# Patient Record
Sex: Female | Born: 1990 | Race: White | Hispanic: No | Smoking: Former smoker
Health system: Southern US, Community
[De-identification: ages and names within clinical notes are randomized; demographics above are authoritative.]

## PROBLEM LIST (undated history)

## (undated) ENCOUNTER — Inpatient Hospital Stay (HOSPITAL_COMMUNITY): Payer: Self-pay

## (undated) DIAGNOSIS — F32A Depression, unspecified: Secondary | ICD-10-CM

## (undated) DIAGNOSIS — D649 Anemia, unspecified: Secondary | ICD-10-CM

## (undated) DIAGNOSIS — K219 Gastro-esophageal reflux disease without esophagitis: Secondary | ICD-10-CM

## (undated) DIAGNOSIS — K279 Peptic ulcer, site unspecified, unspecified as acute or chronic, without hemorrhage or perforation: Secondary | ICD-10-CM

## (undated) DIAGNOSIS — IMO0002 Reserved for concepts with insufficient information to code with codable children: Secondary | ICD-10-CM

## (undated) DIAGNOSIS — J45909 Unspecified asthma, uncomplicated: Secondary | ICD-10-CM

## (undated) DIAGNOSIS — K3184 Gastroparesis: Secondary | ICD-10-CM

## (undated) DIAGNOSIS — M329 Systemic lupus erythematosus, unspecified: Secondary | ICD-10-CM

## (undated) DIAGNOSIS — F419 Anxiety disorder, unspecified: Secondary | ICD-10-CM

## (undated) DIAGNOSIS — K449 Diaphragmatic hernia without obstruction or gangrene: Secondary | ICD-10-CM

## (undated) DIAGNOSIS — O210 Mild hyperemesis gravidarum: Secondary | ICD-10-CM

## (undated) DIAGNOSIS — N200 Calculus of kidney: Secondary | ICD-10-CM

## (undated) HISTORY — DX: Diaphragmatic hernia without obstruction or gangrene: K44.9

## (undated) HISTORY — DX: Calculus of kidney: N20.0

## (undated) HISTORY — DX: Peptic ulcer, site unspecified, unspecified as acute or chronic, without hemorrhage or perforation: K27.9

## (undated) HISTORY — DX: Anemia, unspecified: D64.9

## (undated) HISTORY — DX: Anxiety disorder, unspecified: F41.9

## (undated) HISTORY — DX: Depression, unspecified: F32.A

## (undated) HISTORY — PX: CYST EXCISION: SHX5701

## (undated) HISTORY — PX: ESOPHAGOGASTRODUODENOSCOPY: SHX1529

---

## 2013-02-08 DIAGNOSIS — G5690 Unspecified mononeuropathy of unspecified upper limb: Secondary | ICD-10-CM | POA: Insufficient documentation

## 2013-02-08 HISTORY — DX: Unspecified mononeuropathy of unspecified upper limb: G56.90

## 2013-11-10 DIAGNOSIS — F32A Depression, unspecified: Secondary | ICD-10-CM | POA: Insufficient documentation

## 2013-12-28 DIAGNOSIS — K3184 Gastroparesis: Secondary | ICD-10-CM | POA: Insufficient documentation

## 2015-06-20 DIAGNOSIS — G894 Chronic pain syndrome: Secondary | ICD-10-CM | POA: Insufficient documentation

## 2015-06-20 DIAGNOSIS — F3162 Bipolar disorder, current episode mixed, moderate: Secondary | ICD-10-CM | POA: Insufficient documentation

## 2015-06-20 HISTORY — DX: Chronic pain syndrome: G89.4

## 2015-06-21 DIAGNOSIS — N94819 Vulvodynia, unspecified: Secondary | ICD-10-CM | POA: Insufficient documentation

## 2015-06-21 HISTORY — DX: Vulvodynia, unspecified: N94.819

## 2015-09-28 DIAGNOSIS — K589 Irritable bowel syndrome without diarrhea: Secondary | ICD-10-CM | POA: Insufficient documentation

## 2015-09-28 HISTORY — DX: Irritable bowel syndrome, unspecified: K58.9

## 2015-12-18 DIAGNOSIS — F319 Bipolar disorder, unspecified: Secondary | ICD-10-CM

## 2015-12-18 HISTORY — DX: Bipolar disorder, unspecified: F31.9

## 2016-02-01 DIAGNOSIS — F319 Bipolar disorder, unspecified: Secondary | ICD-10-CM | POA: Insufficient documentation

## 2016-02-01 HISTORY — DX: Bipolar disorder, unspecified: F31.9

## 2020-02-26 NOTE — L&D Delivery Note (Signed)
OB/GYN Faculty Practice Delivery Note  Meagan Hunter is a 30 y.o. E3O1224 s/p SVD at [redacted]w[redacted]d. She was admitted for eIOL after two weeks of prodromal labor, a history of a precipitous delivery and anxiety.   ROM: 2h 17m with clear fluid GBS Status: negative Maximum Maternal Temperature: 98.6  Labor Progress: Labor induced with pitocin titration and AROM. Rapidly progressed from 7cm to complete with strong urge to push.  Delivery Date/Time: 12/13/20 at 1458 Delivery: Called to room and patient was complete and pushing. Head delivered LOA. No nuchal cord present. Shoulder and body delivered in usual fashion. Infant with spontaneous cry, placed on mother's abdomen, dried and stimulated. Cord clamped x 2 after 3-minute delay, and cut by FOB. Cord blood drawn and cord sample collected. Placenta delivered spontaneously, intact, with 3-vessel marginally inserted cord. Fundus firm with massage and Pitocin. Labia, perineum, vagina, and cervix inspected, small superficial, hemostatic but not well-approximated perineal laceration found and repaired with 3.0 vicryl.   Placenta: intact, spontaneous, sent to L&D Complications: none Lacerations: superficial perineal lac EBL: 75 Analgesia: epidural  Postpartum Planning [x]  transfer orders to MB [x]  discharge summary started & shared [x]  message to sent to schedule follow-up  [x]  lists updated [x]  vaccines UTD  Infant: Boy(yes)  APGARs 9/9  3300g  , CNM, IBCLC Certified Nurse Midwife, [facility] for , [facility] 12/13/2020, 4:00 PM

## 2020-05-26 DIAGNOSIS — O0990 Supervision of high risk pregnancy, unspecified, unspecified trimester: Secondary | ICD-10-CM | POA: Insufficient documentation

## 2020-06-04 DIAGNOSIS — M329 Systemic lupus erythematosus, unspecified: Secondary | ICD-10-CM | POA: Insufficient documentation

## 2020-06-04 DIAGNOSIS — M351 Other overlap syndromes: Secondary | ICD-10-CM | POA: Insufficient documentation

## 2020-06-05 LAB — OB RESULTS CONSOLE HIV ANTIBODY (ROUTINE TESTING): HIV: NONREACTIVE

## 2020-06-05 LAB — HM PAP SMEAR: HM Pap smear: NORMAL

## 2020-06-05 LAB — OB RESULTS CONSOLE HEPATITIS B SURFACE ANTIGEN: Hepatitis B Surface Ag: NEGATIVE

## 2020-06-05 LAB — HEPATITIS C ANTIBODY: HCV Ab: NEGATIVE

## 2020-06-05 LAB — OB RESULTS CONSOLE RUBELLA ANTIBODY, IGM: Rubella: IMMUNE

## 2020-06-05 LAB — OB RESULTS CONSOLE RPR: RPR: NONREACTIVE

## 2020-07-19 DIAGNOSIS — O26899 Other specified pregnancy related conditions, unspecified trimester: Secondary | ICD-10-CM | POA: Insufficient documentation

## 2020-08-16 ENCOUNTER — Inpatient Hospital Stay (HOSPITAL_COMMUNITY)
Admission: AD | Admit: 2020-08-16 | Discharge: 2020-08-16 | Disposition: A | Discharge status: Home / Self Care | Payer: 59 | Attending: Obstetrics and Gynecology | Admitting: Obstetrics and Gynecology

## 2020-08-16 ENCOUNTER — Encounter (HOSPITAL_COMMUNITY): Payer: Self-pay | Admitting: Obstetrics and Gynecology

## 2020-08-16 DIAGNOSIS — O212 Late vomiting of pregnancy: Secondary | ICD-10-CM | POA: Insufficient documentation

## 2020-08-16 DIAGNOSIS — O210 Mild hyperemesis gravidarum: Secondary | ICD-10-CM

## 2020-08-16 DIAGNOSIS — Z87891 Personal history of nicotine dependence: Secondary | ICD-10-CM | POA: Diagnosis not present

## 2020-08-16 DIAGNOSIS — Z3A22 22 weeks gestation of pregnancy: Secondary | ICD-10-CM | POA: Diagnosis not present

## 2020-08-16 DIAGNOSIS — O219 Vomiting of pregnancy, unspecified: Secondary | ICD-10-CM | POA: Diagnosis not present

## 2020-08-16 HISTORY — DX: Gastro-esophageal reflux disease without esophagitis: K21.9

## 2020-08-16 HISTORY — DX: Gastroparesis: K31.84

## 2020-08-16 HISTORY — DX: Mild hyperemesis gravidarum: O21.0

## 2020-08-16 HISTORY — DX: Unspecified asthma, uncomplicated: J45.909

## 2020-08-16 HISTORY — DX: Reserved for concepts with insufficient information to code with codable children: IMO0002

## 2020-08-16 HISTORY — DX: Systemic lupus erythematosus, unspecified: M32.9

## 2020-08-16 LAB — CBC WITH DIFFERENTIAL/PLATELET
Abs Immature Granulocytes: 0.05 10*3/uL (ref 0.00–0.07)
Basophils Absolute: 0 10*3/uL (ref 0.0–0.1)
Basophils Relative: 0 %
Eosinophils Absolute: 0 10*3/uL (ref 0.0–0.5)
Eosinophils Relative: 0 %
HCT: 37.3 % (ref 36.0–46.0)
Hemoglobin: 12.7 g/dL (ref 12.0–15.0)
Immature Granulocytes: 0 %
Lymphocytes Relative: 17 %
Lymphs Abs: 2.3 10*3/uL (ref 0.7–4.0)
MCH: 32.1 pg (ref 26.0–34.0)
MCHC: 34 g/dL (ref 30.0–36.0)
MCV: 94.2 fL (ref 80.0–100.0)
Monocytes Absolute: 0.6 10*3/uL (ref 0.1–1.0)
Monocytes Relative: 4 %
Neutro Abs: 10.4 10*3/uL — ABNORMAL HIGH (ref 1.7–7.7)
Neutrophils Relative %: 79 %
Platelets: 190 10*3/uL (ref 150–400)
RBC: 3.96 MIL/uL (ref 3.87–5.11)
RDW: 12.8 % (ref 11.5–15.5)
WBC: 13.4 10*3/uL — ABNORMAL HIGH (ref 4.0–10.5)
nRBC: 0 % (ref 0.0–0.2)

## 2020-08-16 LAB — COMPREHENSIVE METABOLIC PANEL
ALT: 18 U/L (ref 0–44)
AST: 23 U/L (ref 15–41)
Albumin: 3.5 g/dL (ref 3.5–5.0)
Alkaline Phosphatase: 46 U/L (ref 38–126)
Anion gap: 13 (ref 5–15)
BUN: 7 mg/dL (ref 6–20)
CO2: 23 mmol/L (ref 22–32)
Calcium: 9.2 mg/dL (ref 8.9–10.3)
Chloride: 103 mmol/L (ref 98–111)
Creatinine, Ser: 0.8 mg/dL (ref 0.44–1.00)
GFR, Estimated: >60 mL/min (ref >60)
Glucose, Bld: 108 mg/dL — ABNORMAL HIGH (ref 70–99)
Potassium: 3.3 mmol/L — ABNORMAL LOW (ref 3.5–5.1)
Sodium: 139 mmol/L (ref 135–145)
Total Bilirubin: 0.9 mg/dL (ref 0.3–1.2)
Total Protein: 7 g/dL (ref 6.5–8.1)

## 2020-08-16 LAB — URINALYSIS, ROUTINE W REFLEX MICROSCOPIC
Bacteria, UA: NONE SEEN
Bilirubin Urine: NEGATIVE
Glucose, UA: NEGATIVE mg/dL
Hgb urine dipstick: NEGATIVE
Ketones, ur: 80 mg/dL — AB
Nitrite: NEGATIVE
Protein, ur: 30 mg/dL — AB
Specific Gravity, Urine: 1.023 (ref 1.005–1.030)
pH: 7 (ref 5.0–8.0)

## 2020-08-16 LAB — LIPASE, BLOOD: Lipase: 28 U/L (ref 11–51)

## 2020-08-16 MED ORDER — SCOPOLAMINE 1 MG/3DAYS TD PT72
1.0000 | MEDICATED_PATCH | TRANSDERMAL | Status: DC
  Administered 2020-08-16: 1.5 mg via TRANSDERMAL
  Filled 2020-08-16: qty 1

## 2020-08-16 MED ORDER — ONDANSETRON HCL 4 MG/2ML IJ SOLN
4.0000 mg | Freq: Once | INTRAMUSCULAR | Status: AC
  Administered 2020-08-16: 4 mg via INTRAVENOUS
  Filled 2020-08-16: qty 2

## 2020-08-16 MED ORDER — LACTATED RINGERS IV BOLUS
1000.0000 mL | Freq: Once | INTRAVENOUS | Status: AC
  Administered 2020-08-16: 1000 mL via INTRAVENOUS

## 2020-08-16 MED ORDER — SODIUM CHLORIDE 0.9 % IV SOLN
25.0000 mg | Freq: Four times a day (QID) | INTRAVENOUS | Status: DC | PRN
  Administered 2020-08-16: 25 mg via INTRAVENOUS
  Filled 2020-08-16 (×2): qty 1

## 2020-08-16 MED ORDER — ONDANSETRON 8 MG PO TBDP: 8.0000 mg | ORAL_TABLET | Freq: Three times a day (TID) | ORAL | 0 refills | Status: DC | PRN

## 2020-08-16 MED ORDER — POLYETHYLENE GLYCOL 3350 17 G PO PACK: 17.0000 g | PACK | Freq: Every day | ORAL | 0 refills | Status: DC

## 2020-08-16 MED ORDER — METOCLOPRAMIDE HCL 5 MG/ML IJ SOLN
10.0000 mg | Freq: Once | INTRAMUSCULAR | Status: AC
  Administered 2020-08-16: 10 mg via INTRAVENOUS
  Filled 2020-08-16: qty 2

## 2020-08-16 MED ORDER — SCOPOLAMINE 1 MG/3DAYS TD PT72: 1.0000 | MEDICATED_PATCH | TRANSDERMAL | 12 refills | Status: DC

## 2020-08-16 MED ORDER — FAMOTIDINE IN NACL 20-0.9 MG/50ML-% IV SOLN
20.0000 mg | Freq: Once | INTRAVENOUS | Status: AC
  Administered 2020-08-16: 20 mg via INTRAVENOUS
  Filled 2020-08-16: qty 50

## 2020-08-16 MED ORDER — PROMETHAZINE HCL 25 MG PO TABS: 25.0000 mg | ORAL_TABLET | Freq: Four times a day (QID) | ORAL | 0 refills | Status: DC | PRN

## 2020-08-16 MED ORDER — DOCUSATE SODIUM 100 MG PO CAPS: 100.0000 mg | ORAL_CAPSULE | Freq: Two times a day (BID) | ORAL | 0 refills | Status: DC

## 2020-08-16 NOTE — MAU Provider Note (Addendum)
History     CSN: [id number]  Arrival date and time: 08/16/20 0620   Event Date/Time   First Provider Initiated Contact with Patient 08/16/20 [phone]      Chief Complaint  Patient presents with   Emesis During Pregnancy   HPI Meagan Hunter is a 30 y.o. G3P2002 at [redacted]w[redacted]d who presents via EMS for nausea, vomiting, and abdominal pain. She gets her care in [facility] due to high risk r/t lupus. Has had hyperemesis gravidarum with all of her pregnancies. Symptoms have been well controlled with zofran, which she takes daily, but she ran out of zofran on Monday. Symptoms returned yesterday. Reports persistent nausea & vomiting. Has pain in the right side of her abdomen & flank. States pain feels sore and is worse with vomiting. Rates pain 8/10. Denies fever/chills, dysuria, hematuria, diarrhea, constipation, vaginal bleeding, or LOF. Hasn't felt the baby move as much since 11 pm last night.   OB History     Gravida  3   Para  2   Term  2   Preterm      AB      Living  2      SAB  0   IAB  0   Ectopic  0   Multiple      Live Births  2           Past Medical History:  Diagnosis Date   Asthma    Gastroparesis    GERD (gastroesophageal reflux disease)    Hyperemesis affecting pregnancy, antepartum    Lupus (HCC)     Past Surgical History:  Procedure Laterality Date   CYST EXCISION      History reviewed. No pertinent family history.  Social History   Tobacco Use   Smoking status: Former    Pack years: 0.00    Types: Cigarettes   Smokeless tobacco: Never  Substance Use Topics   Alcohol use: Not Currently   Drug use: Never    Allergies:  Allergies  Allergen Reactions   Penicillins Hives    Medications Prior to Admission  Medication Sig Dispense Refill Last Dose   ondansetron (ZOFRAN) 4 MG tablet Take 4 mg by mouth every 8 (eight) hours as needed for nausea or vomiting.       Review of Systems  Constitutional: Negative.   Gastrointestinal:   Positive for abdominal pain, nausea and vomiting. Negative for constipation and diarrhea.  Genitourinary: Negative.   Physical Exam   Blood pressure (!) 103/59, pulse 79, temperature 97.8 F (36.6 C), temperature source Oral, resp. rate 17, height 5\' 4"  (1.626 m), weight 61.2 kg, SpO2 100 %.  Physical Exam Constitutional:      General: She is not in acute distress.    Appearance: Normal appearance. She is normal weight.  HENT:     Head: Normocephalic and atraumatic.  Eyes:     General: No scleral icterus. Pulmonary:     Effort: Pulmonary effort is normal. No respiratory distress.  Abdominal:     Palpations: Abdomen is soft.     Tenderness: There is no abdominal tenderness. There is no right CVA tenderness, left CVA tenderness, guarding or rebound.  Skin:    General: Skin is warm and dry.  Neurological:     Mental Status: She is alert.  Psychiatric:        Mood and Affect: Mood normal.        Behavior: Behavior normal.    MAU Course  Procedures Results for  orders placed or performed during the hospital encounter of 08/16/20 (from the past 24 hour(s))  CBC with Differential/Platelet     Status: Abnormal   Collection Time: 08/16/20  6:55 AM  Result Value Ref Range   WBC 13.4 (H) 4.0 - 10.5 K/uL   RBC 3.96 3.87 - 5.11 MIL/uL   Hemoglobin 12.7 12.0 - 15.0 g/dL   HCT 31.5 40.0 - 86.7 %   MCV 94.2 80.0 - 100.0 fL   MCH 32.1 26.0 - 34.0 pg   MCHC 34.0 30.0 - 36.0 g/dL   RDW 61.9 50.9 - 32.6 %   Platelets 190 150 - 400 K/uL   nRBC 0.0 0.0 - 0.2 %   Neutrophils Relative % 79 %   Neutro Abs 10.4 (H) 1.7 - 7.7 K/uL   Lymphocytes Relative 17 %   Lymphs Abs 2.3 0.7 - 4.0 K/uL   Monocytes Relative 4 %   Monocytes Absolute 0.6 0.1 - 1.0 K/uL   Eosinophils Relative 0 %   Eosinophils Absolute 0.0 0.0 - 0.5 K/uL   Basophils Relative 0 %   Basophils Absolute 0.0 0.0 - 0.1 K/uL   Immature Granulocytes 0 %   Abs Immature Granulocytes 0.05 0.00 - 0.07 K/uL  Comprehensive metabolic  panel     Status: Abnormal   Collection Time: 08/16/20  6:55 AM  Result Value Ref Range   Sodium 139 135 - 145 mmol/L   Potassium 3.3 (L) 3.5 - 5.1 mmol/L   Chloride 103 98 - 111 mmol/L   CO2 23 22 - 32 mmol/L   Glucose, Bld 108 (H) 70 - 99 mg/dL   BUN 7 6 - 20 mg/dL   Creatinine, Ser 7.12 0.44 - 1.00 mg/dL   Calcium 9.2 8.9 - 45.8 mg/dL   Total Protein 7.0 6.5 - 8.1 g/dL   Albumin 3.5 3.5 - 5.0 g/dL   AST 23 15 - 41 U/L   ALT 18 0 - 44 U/L   Alkaline Phosphatase 46 38 - 126 U/L   Total Bilirubin 0.9 0.3 - 1.2 mg/dL   GFR, Estimated >09 >98 mL/min   Anion gap 13 5 - 15  Lipase, blood     Status: None   Collection Time: 08/16/20  6:55 AM  Result Value Ref Range   Lipase 28 11 - 51 U/L   No results found.  MDM FHT present via doppler  Patient vomiting on arrival. IV fluids, zofran, & pepcid ordered. Patient states symptoms are consistent with her HEG but worse than normal. Reports pain throughout her right abdomen & flank. Benign abdominal exam. CBC, CMP, lipase, and u/a pending.    Care turned over to [facility] CNM Judeth Horn, NP 08/16/2020 8:18 AM  Assessment and Plan   1. Morning sickness     0900: patient feeling nauseated and says that sips of ginger ale made it worse, will give more fluids and phenergan, scop patch 1241: patient feeling better; still has some burning in her chest but it is manageable and she does not want GI Cocktail.  -patient to try sips of water through straw Patient tolerated PO, reports that her nausea is completely gone.  -will discharge home with new RX for zofran, phenergan, scop patch -all questions answered, patient agrees with plan of care.  -patient to follow up in [facility] her ob provider   Luna Kitchens

## 2020-08-16 NOTE — MAU Note (Signed)
PT SAYS SHE STARTED VOMITING Tuesday AM- SAW BROWN AND RED IN VOMIT. Lower right sharp abd pain  and upper is cramping  PNV is [facility]  bc pt has Lupus

## 2020-09-18 ENCOUNTER — Telehealth: Payer: Self-pay | Admitting: *Deleted

## 2020-09-18 MED ORDER — ONDANSETRON 8 MG PO TBDP: 8.0000 mg | ORAL_TABLET | Freq: Three times a day (TID) | ORAL | 0 refills | Status: DC | PRN

## 2020-09-18 NOTE — Telephone Encounter (Signed)
Pt called stating that she has transferred her OB care from [facility] to [place].  She is requesting a RF on Zofran but since she is no longer [facility] pt they are denying the RX.  I have spoken with Dr Macon Large who gave a verbal order to RF the Zofran.

## 2020-09-28 ENCOUNTER — Other Ambulatory Visit: Payer: Self-pay

## 2020-09-28 ENCOUNTER — Encounter: Payer: Self-pay | Admitting: Obstetrics and Gynecology

## 2020-09-28 ENCOUNTER — Ambulatory Visit (INDEPENDENT_AMBULATORY_CARE_PROVIDER_SITE_OTHER): Payer: Medicaid Other | Admitting: Obstetrics and Gynecology

## 2020-09-28 VITALS — BP 108/71 | HR 110 | Wt 144.0 lb

## 2020-09-28 DIAGNOSIS — Z3490 Encounter for supervision of normal pregnancy, unspecified, unspecified trimester: Secondary | ICD-10-CM

## 2020-09-28 DIAGNOSIS — M329 Systemic lupus erythematosus, unspecified: Secondary | ICD-10-CM

## 2020-09-28 DIAGNOSIS — Z3A28 28 weeks gestation of pregnancy: Secondary | ICD-10-CM

## 2020-09-28 DIAGNOSIS — O99113 Other diseases of the blood and blood-forming organs and certain disorders involving the immune mechanism complicating pregnancy, third trimester: Secondary | ICD-10-CM

## 2020-09-28 DIAGNOSIS — R12 Heartburn: Secondary | ICD-10-CM

## 2020-09-28 DIAGNOSIS — O99343 Other mental disorders complicating pregnancy, third trimester: Secondary | ICD-10-CM

## 2020-09-28 DIAGNOSIS — O26893 Other specified pregnancy related conditions, third trimester: Secondary | ICD-10-CM

## 2020-09-28 NOTE — Patient Instructions (Signed)
[email or web address].com/.aspx">  Third Trimester of Pregnancy  The third trimester of pregnancy is from week 28 through week 40. This is months 7 through 9. The third trimester is a time when the unborn baby (fetus) is growing rapidly. At the end of the ninth month, the fetus is about 20inches long and weighs 6-10 pounds. Body changes during your third trimester During the third trimester, your body will continue to go through many changes.The changes vary and generally return to normal after your baby is born. Physical changes Your weight will continue to increase. You can expect to gain 25-35 pounds (11-16 kg) by the end of the pregnancy if you begin pregnancy at a normal weight. If you are underweight, you can expect to gain 28-40 lb (about 13-18 kg), and if you are overweight, you can expect to gain 15-25 lb (about 7-11 kg). You may begin to get stretch marks on your hips, abdomen, and breasts. Your breasts will continue to grow and may hurt. A yellow fluid (colostrum) may leak from your breasts. This is the first milk you are producing for your baby. You may have changes in your hair. These can include thickening of your hair, rapid growth, and changes in texture. Some people also have hair loss during or after pregnancy, or hair that feels dry or thin. Your belly button may stick out. You may notice more swelling in your hands, face, or ankles. Health changes You may have heartburn. You may have constipation. You may develop hemorrhoids. You may develop swollen, bulging veins (varicose veins) in your legs. You may have increased body aches in the pelvis, back, or thighs. This is due to weight gain and increased hormones that are relaxing your joints. You may have increased tingling or numbness in your hands, arms, and legs. The skin on your abdomen may also feel numb. You may feel short of breath because of your expanding uterus. Other  changes You may urinate more often because the fetus is moving lower into your pelvis and pressing on your bladder. You may have more problems sleeping. This may be caused by the size of your abdomen, an increased need to urinate, and an increase in your body's metabolism. You may notice the fetus "dropping," or moving lower in your abdomen (lightening). You may have increased vaginal discharge. You may notice that you have pain around your pelvic bone as your uterus distends. Follow these instructions at home: Medicines Follow your health care provider's instructions regarding medicine use. Specific medicines may be either safe or unsafe to take during pregnancy. Do not take any medicines unless approved by your health care provider. Take a prenatal vitamin that contains at least 600 micrograms (mcg) of folic acid. Eating and drinking Eat a healthy diet that includes fresh fruits and vegetables, whole grains, good sources of protein such as meat, eggs, or tofu, and low-fat dairy products. Avoid raw meat and unpasteurized juice, milk, and cheese. These carry germs that can harm you and your baby. Eat 4 or 5 small meals rather than 3 large meals a day. You may need to take these actions to prevent or treat constipation: Drink enough fluid to keep your urine pale yellow. Eat foods that are high in fiber, such as beans, whole grains, and fresh fruits and vegetables. Limit foods that are high in fat and processed sugars, such as fried or sweet foods. Activity Exercise only as directed by your health care provider. Most people can continue their usual exercise routine during pregnancy. Try  to exercise for 30 minutes at least 5 days a week. Stop exercising if you experience contractions in the uterus. Stop exercising if you develop pain or cramping in the lower abdomen or lower back. Avoid heavy lifting. Do not exercise if it is very hot or humid or if you are at a high altitude. If you choose to,  you may continue to have sex unless your health care provider tells you not to. Relieving pain and discomfort Take frequent breaks and rest with your legs raised (elevated) if you have leg cramps or low back pain. Take warm sitz baths to soothe any pain or discomfort caused by hemorrhoids. Use hemorrhoid cream if your health care provider approves. Wear a supportive bra to prevent discomfort from breast tenderness. If you develop varicose veins: Wear support hose as told by your health care provider. Elevate your feet for 15 minutes, 3-4 times a day. Limit salt in your diet. Safety Talk to your health care provider before traveling far distances. Do not use hot tubs, steam rooms, or saunas. Wear your seat belt at all times when driving or riding in a car. Talk with your health care provider if someone is verbally or physically abusive to you. Preparing for birth To prepare for the arrival of your baby: Take prenatal classes to understand, practice, and ask questions about labor and delivery. Visit the hospital and tour the maternity area. Purchase a rear-facing car seat and make sure you know how to install it in your car. Prepare the baby's room or sleeping area. Make sure to remove all pillows and stuffed animals from the baby's crib to prevent suffocation. General instructions Avoid cat litter boxes and soil used by cats. These carry germs that can cause birth defects in the baby. If you have a cat, ask someone to clean the litter box for you. Do not douche or use tampons. Do not use scented sanitary pads. Do not use any products that contain nicotine or tobacco, such as cigarettes, e-cigarettes, and chewing tobacco. If you need help quitting, ask your health care provider. Do not use any herbal remedies, illegal drugs, or medicines that were not prescribed to you. Chemicals in these products can harm your baby. Do not drink alcohol. You will have more frequent prenatal exams during the  third trimester. During a routine prenatal visit, your health care provider will do a physical exam, perform tests, and discuss your overall health. Keep all follow-up visits. This is important. Where to find more information American Pregnancy Association: americanpregnancy.[place] and Gynecologists: PoolDevices.com.pt Office on [employer]: KeywordPortfolios.com.br Contact a health care provider if you have: A fever. Mild pelvic cramps, pelvic pressure, or nagging pain in your abdominal area or lower back. Vomiting or diarrhea. Bad-smelling vaginal discharge or foul-smelling urine. Pain when you urinate. A headache that does not go away when you take medicine. Visual changes or see spots in front of your eyes. Get help right away if: Your water breaks. You have regular contractions less than 5 minutes apart. You have spotting or bleeding from your vagina. You have severe abdominal pain. You have difficulty breathing. You have chest pain. You have fainting spells. You have not felt your baby move for the time period told by your health care provider. You have new or increased pain, swelling, or redness in an arm or leg. Summary The third trimester of pregnancy is from week 28 through week 40 (months 7 through 9). You may have more problems  sleeping. This can be caused by the size of your abdomen, an increased need to urinate, and an increase in your body's metabolism. You will have more frequent prenatal exams during the third trimester. Keep all follow-up visits. This is important. This information is not intended to replace advice given to you by your health care provider. Make sure you discuss any questions you have with your healthcare provider. Document Revised: 07/21/2019 Document Reviewed: 05/27/2019 Elsevier Patient Education  2022 Elsevier Inc.  Contraception Choices Contraception, also called birth control, refers to  methods or devices thatprevent pregnancy. Hormonal methods  Contraceptive implant A contraceptive implant is a thin, plastic tube that contains a hormone that prevents pregnancy. It is different from an intrauterine device (IUD). It is inserted into the upper part of the arm by a health care provider. Implants canbe effective for up to 3 years. Progestin-only injections Progestin-only injections are injections of progestin, a synthetic form of thehormone progesterone. They are given every 3 months by a health care provider. Birth control pills Birth control pills are pills that contain hormones that prevent pregnancy. They must be taken once a day, preferably at the same time each day. Aprescription is needed to use this method of contraception. Birth control patch The birth control patch contains hormones that prevent pregnancy. It is placed on the skin and must be changed once a week for three weeks and removed on thefourth week. A prescription is needed to use this method of contraception. Vaginal ring A vaginal ring contains hormones that prevent pregnancy. It is placed in the vagina for three weeks and removed on the fourth week. After that, the process is repeated with a new ring. A prescription is needed to use this method ofcontraception. Emergency contraceptive Emergency contraceptives prevent pregnancy after unprotected sex. They come in pill form and can be taken up to 5 days after sex. They work best the sooner they are taken after having sex. Most emergency contraceptives are available without a prescription. This method should not be used as your only form ofbirth control. Barrier methods  Female condom A female condom is a thin sheath that is worn over the penis during sex. Condoms keep sperm from going inside a woman's body. They can be used with a sperm-killing substance (spermicide) to increase their effectiveness. They should be thrown away after one use. Female condom A female  condom is a soft, loose-fitting sheath that is put into the vagina before sex. The condom keeps sperm from going inside a woman's body. Theyshould be thrown away after one use. Diaphragm A diaphragm is a soft, dome-shaped barrier. It is inserted into the vagina before sex, along with a spermicide. The diaphragm blocks sperm from entering the uterus, and the spermicide kills sperm. A diaphragm should be left in thevagina for 6-8 hours after sex and removed within 24 hours. A diaphragm is prescribed and fitted by a health care provider. A diaphragm should be replaced every 1-2 years, after giving birth, after gaining more than15 lb (6.8 kg), and after pelvic surgery. Cervical cap A cervical cap is a round, soft latex or plastic cup that fits over the cervix. It is inserted into the vagina before sex, along with spermicide. It blocks sperm from entering the uterus. The cap should be left in place for 6-8 hours after sex and removed within 48 hours. A cervical cap must be prescribed andfitted by a health care provider. It should be replaced every 2 years. Sponge A sponge is a soft,   circular piece of polyurethane foam with spermicide in it. The sponge helps block sperm from entering the uterus, and the spermicide kills sperm. To use it, you make it wet and then insert it into the vagina. It should be inserted before sex, left in for at least 6 hours after sex, and removed andthrown away within 30 hours. Spermicides Spermicides are chemicals that kill or block sperm from entering the cervix and uterus. They can come as a cream, jelly, suppository, foam, or tablet. A spermicide should be inserted into the vagina with an applicator at least 10-15 minutes before sex to allow time for it to work. The process must be repeatedevery time you have sex. Spermicides do not require a prescription. Intrauterine contraception Intrauterine device (IUD) An IUD is a T-shaped device that is put in a woman's uterus. There are  two types: Hormone IUD.This type contains progestin, a synthetic form of the hormone progesterone. This type can stay in place for 3-5 years. Copper IUD.This type is wrapped in copper wire. It can stay in place for 10 years. Permanent methods of contraception Female tubal ligation In this method, a woman's fallopian tubes are sealed, tied, or blocked duringsurgery to prevent eggs from traveling to the uterus. Hysteroscopic sterilization In this method, a small, flexible insert is placed into each fallopian tube. The inserts cause scar tissue to form in the fallopian tubes and block them, so sperm cannot reach an egg. The procedure takes about 3 months to be effective.Another form of birth control must be used during those 3 months. Female sterilization This is a procedure to tie off the tubes that carry sperm (vasectomy). After the procedure, the man can still ejaculate fluid (semen). Another form of birth control must be used for 3 months after the procedure. Natural planning methods Natural family planning In this method, a couple does not have sex on days when the woman could become pregnant. Calendar method In this method, the woman keeps track of the length of each menstrual cycle, identifies the days when pregnancy can happen, and does not have sex on those days. Ovulation method In this method, a couple avoids sex during ovulation. Symptothermal method This method involves not having sex during ovulation. The woman typically checks for ovulation bywatching changes in her temperature and in the consistency of cervical mucus. Post-ovulation method In this method, a couple waits to have sex until after ovulation. Where to find more information Centers for Disease Control and Prevention: FootballExhibition.com.br Summary Contraception, also called birth control, refers to methods or devices that prevent pregnancy. Hormonal methods of contraception include implants, injections, pills, patches, vaginal  rings, and emergency contraceptives. Barrier methods of contraception can include female condoms, female condoms, diaphragms, cervical caps, sponges, and spermicides. There are two types of IUDs (intrauterine devices). An IUD can be put in a woman's uterus to prevent pregnancy for 3-5 years. Permanent sterilization can be done through a procedure for males and females. Natural family planning methods involve nothaving sex on days when the woman could become pregnant. This information is not intended to replace advice given to you by your health care provider. Make sure you discuss any questions you have with your healthcare provider. Document Revised: 07/19/2019 Document Reviewed: 07/19/2019 Elsevier Patient Education  2022 [employer].

## 2020-09-28 NOTE — Progress Notes (Signed)
  Subjective:    Meagan Hunter is a [id number] [redacted]w[redacted]d being seen today for her first obstetrical visit.  Patient is transferring care from [facility] with records. Her obstetrical history is significant for SLE. Patient was seen by MFM on 7/1 with plans for fetal echo and serial growth ultrasound. Patient also reports a history of seizure while on Sertraline. Patient states these were medication induced seizures and she has never had seizures before or after sertraline intake. Patient does intend to breast feed. Pregnancy history fully reviewed.  Patient reports persistent nausea despite the use of zofran and phenergan. She has tried diclegis and scopolamine patch without improvement.  Vitals:   09/28/20 0857  BP: 108/71  Pulse: (!) 110  Weight: 144 lb (65.3 kg)    HISTORY: OB History  Gravida Para Term Preterm AB Living  3 2 2     2   SAB IAB Ectopic Multiple Live Births  0 0 0   2    # Outcome Date GA Lbr Len/2nd Weight Sex Delivery Anes PTL Lv  3 Current           2 Term      Vag-Spont     1 Term      Vag-Spont      Past Medical History:  Diagnosis Date   Asthma    Gastroparesis    GERD (gastroesophageal reflux disease)    Hyperemesis affecting pregnancy, antepartum    Lupus (HCC)    Past Surgical History:  Procedure Laterality Date   CYST EXCISION     Family History  Problem Relation Age of Onset   Hypertension Mother    Cancer Mother    Thyroid cancer Mother    Hypertension Father      Exam    Uterus:  Fundal Height: 28 cm      Assessment:    Pregnancy: Patient Active Problem List   Diagnosis Date Noted   Heartburn in pregnancy 07/19/2020   Lupus (systemic lupus erythematosus) (HCC) 06/04/2020   Supervision of high risk pregnancy, antepartum 05/26/2020   Bipolar 1 disorder, depressed (HCC) 02/01/2016   Bipolar disorder, rapid cycling (HCC) 12/18/2015   Vulvodynia, 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 Problem list reviewed and updated.  Ultrasound discussed; fetal survey: results reviewed. Follow up growth ultrasound scheduled along with fetal echo Patient unable to stay for 2 hour glucola due to work schedule. Patient plans to return prior to her next appointment for glucola Patient is not taking previously prescribed ASA as she feels she is taking a lot of medication for the management of her hyperemesis Patient is in search of a new pediatrician and is undecided on contraception  Follow up in 2 weeks. 50% of 30 min visit spent on counseling and coordination of care.     Shaida Route 09/28/2020

## 2020-10-02 ENCOUNTER — Other Ambulatory Visit: Payer: Medicaid Other

## 2020-10-04 ENCOUNTER — Telehealth: Payer: Self-pay | Admitting: *Deleted

## 2020-10-04 NOTE — Telephone Encounter (Signed)
Left patient an urgent message to call the office to reschedule missed 2 hour GTT on 10/02/2020 at 8:30 AM. Patient has not read MyChart message sent on 10/02/2020.

## 2020-10-05 ENCOUNTER — Other Ambulatory Visit: Payer: Self-pay | Admitting: *Deleted

## 2020-10-05 MED ORDER — ONDANSETRON 8 MG PO TBDP
8.0000 mg | ORAL_TABLET | Freq: Three times a day (TID) | ORAL | 0 refills | Status: DC | PRN
Start: 2020-10-05 — End: 2020-11-07

## 2020-10-05 NOTE — Telephone Encounter (Signed)
Pt called requesting a RF on her Zofran.  RF sent.

## 2020-10-13 ENCOUNTER — Other Ambulatory Visit: Payer: Self-pay

## 2020-10-13 ENCOUNTER — Ambulatory Visit (INDEPENDENT_AMBULATORY_CARE_PROVIDER_SITE_OTHER): Payer: Medicaid Other | Admitting: Obstetrics and Gynecology

## 2020-10-13 VITALS — BP 114/76 | HR 106 | Wt 144.0 lb

## 2020-10-13 DIAGNOSIS — M329 Systemic lupus erythematosus, unspecified: Secondary | ICD-10-CM

## 2020-10-13 DIAGNOSIS — Z23 Encounter for immunization: Secondary | ICD-10-CM | POA: Diagnosis not present

## 2020-10-13 DIAGNOSIS — Z3A30 30 weeks gestation of pregnancy: Secondary | ICD-10-CM

## 2020-10-13 DIAGNOSIS — O0990 Supervision of high risk pregnancy, unspecified, unspecified trimester: Secondary | ICD-10-CM

## 2020-10-13 MED ORDER — HYDROCORTISONE ACETATE 25 MG RE SUPP: 25.0000 mg | Freq: Two times a day (BID) | RECTAL | 0 refills | Status: DC

## 2020-10-13 MED ORDER — BLOOD GLUCOSE MONITOR KIT: PACK | 0 refills | Status: DC

## 2020-10-13 MED ORDER — PROMETHAZINE HCL 25 MG PO TABS: 25.0000 mg | ORAL_TABLET | Freq: Four times a day (QID) | ORAL | 1 refills | Status: DC | PRN

## 2020-10-13 NOTE — Progress Notes (Signed)
   PRENATAL VISIT NOTE  Subjective:  Meagan Hunter is a 30 y.o. G3P2002 at [redacted]w[redacted]d being seen today for ongoing prenatal care.  She is currently monitored for the following issues for this high-risk pregnancy and has Supervision of high risk pregnancy, antepartum; Bipolar 1 disorder, depressed ([place]); Lupus (systemic lupus erythematosus) ([place]); Vulvodynia, unspecified; Heartburn in pregnancy; Depression; and Bipolar disorder, rapid cycling ([place]) on their problem list.  Patient reports no complaints.  Contractions: Not present. Vag. Bleeding: None.  Movement: Present. Denies leaking of fluid.   The following portions of the patient's history were reviewed and updated as appropriate: allergies, current medications, past family history, past medical history, past social history, past surgical history and problem list.   Objective:   Vitals:   10/13/20 1113  BP: 114/76  Pulse: (!) 106  Weight: 144 lb (65.3 kg)    Fetal Status: Fetal Heart Rate (bpm): 135 Fundal Height: 31 cm Movement: Present     General:  Alert, oriented and cooperative. Patient is in no acute distress.  Skin: Skin is warm and dry. No rash noted.   Cardiovascular: Normal heart rate noted  Respiratory: Normal respiratory effort, no problems with respiration noted  Abdomen: Soft, gravid, appropriate for gestational age.  Pain/Pressure: Present     Pelvic: Cervical exam deferred        Extremities: Normal range of motion.  Edema: None  Mental Status: Normal mood and affect. Normal behavior. Normal judgment and thought content.   Assessment and Plan:  Pregnancy: G3P2002 at [redacted]w[redacted]d  1. Supervision of high risk pregnancy, antepartum  - She is not able to tolerate 2 hour GTT, reports taking Zofran in the AM and still vomiting. She is agreeable to check her blood sugars 4 x per day and bring her log in 2 weeks.  - CBC w/Diff - HgB A1c - blood glucose meter kit and supplies KIT; Dispense based on patient and insurance  preference. Use up to four times daily as directed.  Dispense: 1 each; Refill: 0  2. Systemic lupus erythematosus, unspecified SLE type, unspecified organ involvement status ([place])  Should see MD periodically, ok to go back to App schedule if [facility] with MD MFM [place] scheduled Will start antenatal testing @ 32 weeks.  Not currently taking medication for Lupus (prefers natural treatment)   Preterm labor symptoms and general obstetric precautions including but not limited to vaginal bleeding, contractions, leaking of fluid and fetal movement were reviewed in detail with the patient. Please refer to After Visit Summary for other counseling recommendations.   Return in about 2 weeks (around 10/27/2020).  Future Appointments  Date Time Provider [place]  10/23/2020 10:50 AM Guss Bunde, MD [facility]  10/23/2020  1:30 PM [facility] NURSE [facility]  10/23/2020  1:45 PM [facility] US6 [facility] [place], NP

## 2020-10-13 NOTE — Progress Notes (Signed)
Pt unable to do 28 week blood work due to not feeling well- will reschedule

## 2020-10-14 LAB — CBC WITH DIFFERENTIAL/PLATELET
Absolute Monocytes: 694 cells/uL (ref 200–950)
Basophils Absolute: 41 cells/uL (ref 0–200)
Basophils Relative: 0.3 %
Eosinophils Absolute: 136 cells/uL (ref 15–500)
Eosinophils Relative: 1 %
HCT: 36.4 % (ref 35.0–45.0)
Hemoglobin: 12.3 g/dL (ref 11.7–15.5)
Lymphs Abs: 3155 cells/uL (ref 850–3900)
MCH: 32.5 pg (ref 27.0–33.0)
MCHC: 33.8 g/dL (ref 32.0–36.0)
MCV: 96.3 fL (ref 80.0–100.0)
MPV: 10.9 fL (ref 7.5–12.5)
Monocytes Relative: 5.1 %
Neutro Abs: 9574 cells/uL — ABNORMAL HIGH (ref 1500–7800)
Neutrophils Relative %: 70.4 %
Platelets: 170 10*3/uL (ref 140–400)
RBC: 3.78 10*6/uL — ABNORMAL LOW (ref 3.80–5.10)
RDW: 12.5 % (ref 11.0–15.0)
Total Lymphocyte: 23.2 %
WBC: 13.6 10*3/uL — ABNORMAL HIGH (ref 3.8–10.8)

## 2020-10-14 LAB — HEMOGLOBIN A1C
Hgb A1c MFr Bld: 4.6 % of total Hgb (ref <5.7)
Mean Plasma Glucose: 85 mg/dL
eAG (mmol/L): 4.7 mmol/L

## 2020-10-17 ENCOUNTER — Other Ambulatory Visit: Payer: Self-pay | Admitting: *Deleted

## 2020-10-17 MED ORDER — PANTOPRAZOLE SODIUM 20 MG PO TBEC: 20.0000 mg | DELAYED_RELEASE_TABLET | Freq: Every day | ORAL | 6 refills | Status: DC

## 2020-10-23 ENCOUNTER — Encounter: Payer: Self-pay | Admitting: *Deleted

## 2020-10-23 ENCOUNTER — Other Ambulatory Visit: Payer: Self-pay

## 2020-10-23 ENCOUNTER — Other Ambulatory Visit: Payer: Self-pay | Admitting: Obstetrics and Gynecology

## 2020-10-23 ENCOUNTER — Ambulatory Visit (INDEPENDENT_AMBULATORY_CARE_PROVIDER_SITE_OTHER): Payer: Medicaid Other | Admitting: Obstetrics & Gynecology

## 2020-10-23 ENCOUNTER — Ambulatory Visit (HOSPITAL_BASED_OUTPATIENT_CLINIC_OR_DEPARTMENT_OTHER): Payer: Medicaid Other

## 2020-10-23 ENCOUNTER — Ambulatory Visit: Payer: Medicaid Other | Attending: Obstetrics and Gynecology | Admitting: *Deleted

## 2020-10-23 ENCOUNTER — Other Ambulatory Visit: Payer: Self-pay | Admitting: *Deleted

## 2020-10-23 VITALS — BP 111/67 | HR 104 | Wt 147.0 lb

## 2020-10-23 VITALS — BP 118/66 | HR 99

## 2020-10-23 DIAGNOSIS — O99891 Other specified diseases and conditions complicating pregnancy: Secondary | ICD-10-CM | POA: Diagnosis not present

## 2020-10-23 DIAGNOSIS — Z3490 Encounter for supervision of normal pregnancy, unspecified, unspecified trimester: Secondary | ICD-10-CM

## 2020-10-23 DIAGNOSIS — M329 Systemic lupus erythematosus, unspecified: Secondary | ICD-10-CM

## 2020-10-23 DIAGNOSIS — Z363 Encounter for antenatal screening for malformations: Secondary | ICD-10-CM | POA: Diagnosis not present

## 2020-10-23 DIAGNOSIS — Z3A32 32 weeks gestation of pregnancy: Secondary | ICD-10-CM | POA: Insufficient documentation

## 2020-10-23 DIAGNOSIS — O0990 Supervision of high risk pregnancy, unspecified, unspecified trimester: Secondary | ICD-10-CM

## 2020-10-23 DIAGNOSIS — F319 Bipolar disorder, unspecified: Secondary | ICD-10-CM

## 2020-10-23 DIAGNOSIS — N94819 Vulvodynia, unspecified: Secondary | ICD-10-CM

## 2020-10-23 NOTE — Progress Notes (Signed)
   PRENATAL VISIT NOTE  Subjective:  Meagan Hunter is a 30 y.o. G3P2002 at [redacted]w[redacted]d being seen today for ongoing prenatal care.  She is currently monitored for the following issues for this high-risk pregnancy and has Supervision of high risk pregnancy, antepartum; Bipolar 1 disorder, depressed (HCC); Lupus (systemic lupus erythematosus) (HCC); Vulvodynia, unspecified; Heartburn in pregnancy; Depression; and Bipolar disorder, rapid cycling (HCC) on their problem list.  Patient reports nausea and vomiting.  Contractions: Not present. Vag. Bleeding: None.  Movement: Present. Denies leaking of fluid.   The following portions of the patient's history were reviewed and updated as appropriate: allergies, current medications, past family history, past medical history, past social history, past surgical history and problem list.   Objective:   Vitals:   10/23/20 1051  BP: 111/67  Pulse: (!) 104  Weight: 147 lb (66.7 kg)    Fetal Status: Fetal Heart Rate (bpm): 143   Movement: Present     General:  Alert, oriented and cooperative. Patient is in no acute distress.  Skin: Skin is warm and dry. No rash noted.   Cardiovascular: Normal heart rate noted  Respiratory: Normal respiratory effort, no problems with respiration noted  Abdomen: Soft, gravid, appropriate for gestational age.  Pain/Pressure: Present     Pelvic: Cervical exam deferred        Extremities: Normal range of motion.  Edema: None  Mental Status: Normal mood and affect. Normal behavior. Normal judgment and thought content.   Assessment and Plan:  Pregnancy: G3P2002 at [redacted]w[redacted]d 1. Supervision of high risk pregnancy, antepartum Stop prenatal vitamins as they make her nauseous.  She has a normal CBC and does not need supplemental iron at this time.  2. Bipolar 1 disorder, depressed (HCC) Patient is not having any issues with depression anxiety or bipolar.  3. Vulvodynia, unspecified Patient would like to defer vaginal exams when  possible.  4. Systemic lupus erythematosus, unspecified SLE type, unspecified organ involvement status (HCC) -MFM [place] for growth; no meds, no symptoms at this time  Preterm labor symptoms and general obstetric precautions including but not limited to vaginal bleeding, contractions, leaking of fluid and fetal movement were reviewed in detail with the patient. Please refer to After Visit Summary for other counseling recommendations.    Future Appointments  Date Time Provider Department Center  10/23/2020  1:30 PM [facility] NURSE [facility]  10/23/2020  1:45 PM [facility] US6 [facility]    Elsie Lincoln, MD

## 2020-10-24 ENCOUNTER — Ambulatory Visit: Payer: Medicaid Other

## 2020-10-31 ENCOUNTER — Encounter: Payer: Self-pay | Admitting: *Deleted

## 2020-11-02 ENCOUNTER — Encounter: Payer: Self-pay | Admitting: *Deleted

## 2020-11-02 ENCOUNTER — Other Ambulatory Visit: Payer: Self-pay

## 2020-11-02 ENCOUNTER — Ambulatory Visit: Payer: Medicaid Other | Attending: Obstetrics

## 2020-11-02 ENCOUNTER — Ambulatory Visit: Payer: Medicaid Other | Admitting: *Deleted

## 2020-11-02 VITALS — BP 111/62 | HR 82

## 2020-11-02 DIAGNOSIS — Z3A33 33 weeks gestation of pregnancy: Secondary | ICD-10-CM | POA: Diagnosis not present

## 2020-11-02 DIAGNOSIS — M329 Systemic lupus erythematosus, unspecified: Secondary | ICD-10-CM | POA: Insufficient documentation

## 2020-11-02 DIAGNOSIS — O99113 Other diseases of the blood and blood-forming organs and certain disorders involving the immune mechanism complicating pregnancy, third trimester: Secondary | ICD-10-CM

## 2020-11-02 DIAGNOSIS — O0990 Supervision of high risk pregnancy, unspecified, unspecified trimester: Secondary | ICD-10-CM

## 2020-11-02 DIAGNOSIS — O43193 Other malformation of placenta, third trimester: Secondary | ICD-10-CM | POA: Diagnosis not present

## 2020-11-07 ENCOUNTER — Other Ambulatory Visit: Payer: Self-pay

## 2020-11-07 ENCOUNTER — Ambulatory Visit (INDEPENDENT_AMBULATORY_CARE_PROVIDER_SITE_OTHER): Payer: Medicaid Other | Admitting: Advanced Practice Midwife

## 2020-11-07 VITALS — BP 105/68 | HR 94 | Wt 147.0 lb

## 2020-11-07 DIAGNOSIS — F319 Bipolar disorder, unspecified: Secondary | ICD-10-CM

## 2020-11-07 DIAGNOSIS — O99113 Other diseases of the blood and blood-forming organs and certain disorders involving the immune mechanism complicating pregnancy, third trimester: Secondary | ICD-10-CM

## 2020-11-07 DIAGNOSIS — D6862 Lupus anticoagulant syndrome: Secondary | ICD-10-CM | POA: Diagnosis not present

## 2020-11-07 DIAGNOSIS — Z3A34 34 weeks gestation of pregnancy: Secondary | ICD-10-CM | POA: Diagnosis not present

## 2020-11-07 DIAGNOSIS — O0990 Supervision of high risk pregnancy, unspecified, unspecified trimester: Secondary | ICD-10-CM

## 2020-11-07 DIAGNOSIS — O219 Vomiting of pregnancy, unspecified: Secondary | ICD-10-CM

## 2020-11-07 DIAGNOSIS — O99343 Other mental disorders complicating pregnancy, third trimester: Secondary | ICD-10-CM

## 2020-11-07 DIAGNOSIS — M329 Systemic lupus erythematosus, unspecified: Secondary | ICD-10-CM

## 2020-11-07 MED ORDER — POLYETHYLENE GLYCOL 3350 17 G PO PACK: 17.0000 g | PACK | Freq: Every day | ORAL | 0 refills | Status: DC

## 2020-11-07 MED ORDER — DOCUSATE SODIUM 100 MG PO CAPS: 100.0000 mg | ORAL_CAPSULE | Freq: Two times a day (BID) | ORAL | 1 refills | Status: DC

## 2020-11-07 MED ORDER — ONDANSETRON 8 MG PO TBDP: 8.0000 mg | ORAL_TABLET | Freq: Three times a day (TID) | ORAL | 1 refills | Status: DC | PRN

## 2020-11-07 NOTE — Progress Notes (Signed)
   PRENATAL VISIT NOTE  Subjective:  Meagan Hunter is a 30 y.o. G3P2002 at [redacted]w[redacted]d being seen today for ongoing prenatal care.  She is currently monitored for the following issues for this high-risk pregnancy and has Supervision of high risk pregnancy, antepartum; Bipolar 1 disorder, depressed (HCC); Lupus (systemic lupus erythematosus) (HCC); Vulvodynia, unspecified; Heartburn in pregnancy; Depression; and Bipolar disorder, rapid cycling (HCC) on their problem list.  Patient reports no complaints.   .  .   . Denies leaking of fluid.   The following portions of the patient's history were reviewed and updated as appropriate: allergies, current medications, past family history, past medical history, past social history, past surgical history and problem list.   Objective:   Vitals:   11/07/20 0906  BP: 105/68  Pulse: 94  Weight: 147 lb (66.7 kg)    Fetal Status:        Presentation: Vertex  General:  Alert, oriented and cooperative. Patient is in no acute distress.  Skin: Skin is warm and dry. No rash noted.   Cardiovascular: Normal heart rate noted  Respiratory: Normal respiratory effort, no problems with respiration noted  Abdomen: Soft, gravid, appropriate for gestational age.        Pelvic: Cervical exam deferred        Extremities: Normal range of motion.     Mental Status: Normal mood and affect. Normal behavior. Normal judgment and thought content.   Assessment and Plan:  Pregnancy: G3P2002 at [redacted]w[redacted]d 1. Supervision of high risk pregnancy, antepartum --Anticipatory guidance about next visits/weeks of pregnancy given. --Next visit in 2 weeks --Fetal echo on Friday, continue antenatal testing as scheduled  2. Systemic lupus erythematosus, unspecified SLE type, unspecified organ involvement status (HCC) --Asymptomatic --antenatal testing  3. Bipolar 1 disorder, depressed (HCC) --Asymptomatic  4. [redacted] weeks gestation of pregnancy   5. Nausea and vomiting during  pregnancy --Renew Rx for Zofran per pt, also renew Colace and Miralax  - ondansetron (ZOFRAN ODT) 8 MG disintegrating tablet; Take 1 tablet (8 mg total) by mouth every 8 (eight) hours as needed for nausea or vomiting.  Dispense: 60 tablet; Refill: 1   Preterm labor symptoms and general obstetric precautions including but not limited to vaginal bleeding, contractions, leaking of fluid and fetal movement were reviewed in detail with the patient. Please refer to After Visit Summary for other counseling recommendations.   No follow-ups on file.  Future Appointments  Date Time Provider Department Center  11/15/2020  1:15 PM [facility] NST [facility]  11/21/2020  8:30 AM [facility] NURSE [facility]  11/21/2020  8:45 AM [facility] US4 [facility]  11/24/2020  9:30 AM Donette Larry, CNM [facility]  11/29/2020  1:15 PM [facility] NST [facility]    Sharen Counter, CNM

## 2020-11-08 ENCOUNTER — Telehealth: Payer: Self-pay | Admitting: *Deleted

## 2020-11-08 ENCOUNTER — Other Ambulatory Visit: Payer: Medicaid Other

## 2020-11-08 NOTE — Telephone Encounter (Signed)
Pt called stating she is [redacted] weeks gestation and has been having lower back pain and contractions about every 15 minutes.  She states that they are about a 5 on the pain scale.  She denies any leaking of fluid or bleeding.  Fetal movement is good.  Suggested increase in fluids and a heating pad to lower back. She is encouraged to go to [facility] if the contractions should come as close as every 5 minutes and last 1 min, she has leaking of fluid or bleeding, decreased fetal movement or the pain becomes unbearable. Encouraged patient to get up and walk to see if this helps with the pain. Pt agrees with plan.

## 2020-11-10 ENCOUNTER — Other Ambulatory Visit: Payer: Medicaid Other

## 2020-11-13 ENCOUNTER — Encounter: Payer: Self-pay | Admitting: Family Medicine

## 2020-11-13 ENCOUNTER — Encounter: Payer: Medicaid Other | Admitting: Family Medicine

## 2020-11-13 NOTE — Progress Notes (Signed)
Patient did not keep appointment today. She will be called to reschedule.  

## 2020-11-15 ENCOUNTER — Ambulatory Visit (INDEPENDENT_AMBULATORY_CARE_PROVIDER_SITE_OTHER): Payer: Medicaid Other

## 2020-11-15 ENCOUNTER — Ambulatory Visit: Payer: Medicaid Other | Admitting: *Deleted

## 2020-11-15 ENCOUNTER — Other Ambulatory Visit: Payer: Self-pay

## 2020-11-15 VITALS — BP 111/69 | HR 84 | Wt 149.0 lb

## 2020-11-15 DIAGNOSIS — M329 Systemic lupus erythematosus, unspecified: Secondary | ICD-10-CM

## 2020-11-15 DIAGNOSIS — O99891 Other specified diseases and conditions complicating pregnancy: Secondary | ICD-10-CM

## 2020-11-15 NOTE — Progress Notes (Signed)

## 2020-11-21 ENCOUNTER — Encounter: Payer: Self-pay | Admitting: *Deleted

## 2020-11-21 ENCOUNTER — Ambulatory Visit (HOSPITAL_COMMUNITY)
Admission: AD | Admit: 2020-11-21 | Discharge: 2020-11-21 | Disposition: A | Discharge status: Home / Self Care | Payer: Medicaid Other | Attending: Obstetrics & Gynecology | Admitting: Obstetrics & Gynecology

## 2020-11-21 ENCOUNTER — Ambulatory Visit: Payer: Medicaid Other | Admitting: *Deleted

## 2020-11-21 ENCOUNTER — Encounter (HOSPITAL_COMMUNITY): Payer: Self-pay | Admitting: Obstetrics & Gynecology

## 2020-11-21 ENCOUNTER — Other Ambulatory Visit: Payer: Self-pay

## 2020-11-21 ENCOUNTER — Ambulatory Visit: Payer: Medicaid Other | Attending: Obstetrics

## 2020-11-21 VITALS — BP 128/73 | HR 93

## 2020-11-21 DIAGNOSIS — Y929 Unspecified place or not applicable: Secondary | ICD-10-CM | POA: Diagnosis not present

## 2020-11-21 DIAGNOSIS — Z3A36 36 weeks gestation of pregnancy: Secondary | ICD-10-CM | POA: Diagnosis not present

## 2020-11-21 DIAGNOSIS — O0990 Supervision of high risk pregnancy, unspecified, unspecified trimester: Secondary | ICD-10-CM

## 2020-11-21 DIAGNOSIS — M329 Systemic lupus erythematosus, unspecified: Secondary | ICD-10-CM

## 2020-11-21 DIAGNOSIS — O26893 Other specified pregnancy related conditions, third trimester: Secondary | ICD-10-CM | POA: Diagnosis present

## 2020-11-21 DIAGNOSIS — Y939 Activity, unspecified: Secondary | ICD-10-CM | POA: Insufficient documentation

## 2020-11-21 DIAGNOSIS — O43193 Other malformation of placenta, third trimester: Secondary | ICD-10-CM

## 2020-11-21 DIAGNOSIS — R519 Headache, unspecified: Secondary | ICD-10-CM | POA: Insufficient documentation

## 2020-11-21 DIAGNOSIS — Z3689 Encounter for other specified antenatal screening: Secondary | ICD-10-CM | POA: Insufficient documentation

## 2020-11-21 DIAGNOSIS — Z87891 Personal history of nicotine dependence: Secondary | ICD-10-CM | POA: Diagnosis not present

## 2020-11-21 DIAGNOSIS — O9A213 Injury, poisoning and certain other consequences of external causes complicating pregnancy, third trimester: Secondary | ICD-10-CM | POA: Diagnosis not present

## 2020-11-21 DIAGNOSIS — Z79899 Other long term (current) drug therapy: Secondary | ICD-10-CM | POA: Diagnosis not present

## 2020-11-21 DIAGNOSIS — R109 Unspecified abdominal pain: Secondary | ICD-10-CM | POA: Insufficient documentation

## 2020-11-21 LAB — URINALYSIS, ROUTINE W REFLEX MICROSCOPIC
Bilirubin Urine: NEGATIVE
Glucose, UA: NEGATIVE mg/dL
Hgb urine dipstick: NEGATIVE
Ketones, ur: NEGATIVE mg/dL
Leukocytes,Ua: NEGATIVE
Nitrite: NEGATIVE
Protein, ur: NEGATIVE mg/dL
Specific Gravity, Urine: 1.011 (ref 1.005–1.030)
pH: 7 (ref 5.0–8.0)

## 2020-11-21 MED ORDER — ACETAMINOPHEN 500 MG PO TABS
1000.0000 mg | ORAL_TABLET | Freq: Once | ORAL | Status: AC
  Administered 2020-11-21: 1000 mg via ORAL
  Filled 2020-11-21: qty 2

## 2020-11-21 MED ORDER — ACETAMINOPHEN 325 MG PO TABS: 650.0000 mg | ORAL_TABLET | ORAL | 0 refills | Status: DC | PRN

## 2020-11-21 MED ORDER — CYCLOBENZAPRINE HCL 10 MG PO TABS: 10.0000 mg | ORAL_TABLET | Freq: Two times a day (BID) | ORAL | 0 refills | Status: DC | PRN
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
## 2020-11-24 ENCOUNTER — Other Ambulatory Visit: Payer: Self-pay

## 2020-11-24 ENCOUNTER — Telehealth (INDEPENDENT_AMBULATORY_CARE_PROVIDER_SITE_OTHER): Payer: Medicaid Other | Admitting: Certified Nurse Midwife

## 2020-11-24 ENCOUNTER — Encounter: Payer: Self-pay | Admitting: Certified Nurse Midwife

## 2020-11-24 DIAGNOSIS — Z3A36 36 weeks gestation of pregnancy: Secondary | ICD-10-CM

## 2020-11-24 DIAGNOSIS — O99891 Other specified diseases and conditions complicating pregnancy: Secondary | ICD-10-CM

## 2020-11-24 DIAGNOSIS — M329 Systemic lupus erythematosus, unspecified: Secondary | ICD-10-CM

## 2020-11-24 DIAGNOSIS — K649 Unspecified hemorrhoids: Secondary | ICD-10-CM

## 2020-11-24 DIAGNOSIS — O2243 Hemorrhoids in pregnancy, third trimester: Secondary | ICD-10-CM

## 2020-11-24 DIAGNOSIS — O0990 Supervision of high risk pregnancy, unspecified, unspecified trimester: Secondary | ICD-10-CM

## 2020-11-24 DIAGNOSIS — Z8709 Personal history of other diseases of the respiratory system: Secondary | ICD-10-CM

## 2020-11-24 MED ORDER — ALBUTEROL SULFATE HFA 108 (90 BASE) MCG/ACT IN AERS: 2.0000 | INHALATION_SPRAY | Freq: Four times a day (QID) | RESPIRATORY_TRACT | 2 refills | Status: DC | PRN

## 2020-11-24 MED ORDER — PRAMOXINE HCL (PERIANAL) 1 % EX FOAM: 1.0000 "application " | Freq: Three times a day (TID) | CUTANEOUS | 0 refills | Status: DC | PRN
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
## 2020-11-28 ENCOUNTER — Ambulatory Visit (INDEPENDENT_AMBULATORY_CARE_PROVIDER_SITE_OTHER): Payer: Medicaid Other

## 2020-11-28 ENCOUNTER — Other Ambulatory Visit (HOSPITAL_COMMUNITY)
Admission: RE | Admit: 2020-11-28 | Discharge: 2020-11-28 | Disposition: A | Discharge status: Home / Self Care | Payer: Medicaid Other | Source: Ambulatory Visit

## 2020-11-28 ENCOUNTER — Other Ambulatory Visit: Payer: Self-pay

## 2020-11-28 VITALS — BP 119/70 | HR 90 | Wt 150.0 lb

## 2020-11-28 DIAGNOSIS — O0993 Supervision of high risk pregnancy, unspecified, third trimester: Secondary | ICD-10-CM | POA: Diagnosis present

## 2020-11-28 DIAGNOSIS — Z3A37 37 weeks gestation of pregnancy: Secondary | ICD-10-CM

## 2020-11-28 DIAGNOSIS — M329 Systemic lupus erythematosus, unspecified: Secondary | ICD-10-CM

## 2020-11-28 DIAGNOSIS — O99891 Other specified diseases and conditions complicating pregnancy: Secondary | ICD-10-CM

## 2020-11-28 DIAGNOSIS — Z3490 Encounter for supervision of normal pregnancy, unspecified, unspecified trimester: Secondary | ICD-10-CM

## 2020-11-28 LAB — CBC
HCT: 33.8 % — ABNORMAL LOW (ref 35.0–45.0)
Hemoglobin: 11.6 g/dL — ABNORMAL LOW (ref 11.7–15.5)
MCH: 32.4 pg (ref 27.0–33.0)
MCHC: 34.3 g/dL (ref 32.0–36.0)
MCV: 94.4 fL (ref 80.0–100.0)
MPV: 11.1 fL (ref 7.5–12.5)
Platelets: 177 10*3/uL (ref 140–400)
RBC: 3.58 10*6/uL — ABNORMAL LOW (ref 3.80–5.10)
RDW: 11.8 % (ref 11.0–15.0)
WBC: 11.6 10*3/uL — ABNORMAL HIGH (ref 3.8–10.8)

## 2020-11-28 LAB — OB RESULTS CONSOLE GC/CHLAMYDIA: Gonorrhea: NEGATIVE

## 2020-11-28 LAB — OB RESULTS CONSOLE GBS: GBS: NEGATIVE
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
## 2020-11-29 ENCOUNTER — Ambulatory Visit: Payer: Medicaid Other | Admitting: *Deleted

## 2020-11-29 ENCOUNTER — Ambulatory Visit (INDEPENDENT_AMBULATORY_CARE_PROVIDER_SITE_OTHER): Payer: Medicaid Other

## 2020-11-29 VITALS — BP 109/76 | HR 86 | Wt 150.0 lb

## 2020-11-29 DIAGNOSIS — O99891 Other specified diseases and conditions complicating pregnancy: Secondary | ICD-10-CM | POA: Diagnosis not present

## 2020-11-29 DIAGNOSIS — Z3A37 37 weeks gestation of pregnancy: Secondary | ICD-10-CM | POA: Diagnosis not present

## 2020-11-29 DIAGNOSIS — M329 Systemic lupus erythematosus, unspecified: Secondary | ICD-10-CM

## 2020-11-29 LAB — CERVICOVAGINAL ANCILLARY ONLY
Chlamydia: NEGATIVE
Comment: NEGATIVE
Comment: NORMAL
Neisseria Gonorrhea: NEGATIVE

## 2020-11-29 NOTE — Progress Notes (Signed)

## 2020-11-30 ENCOUNTER — Other Ambulatory Visit: Payer: Self-pay

## 2020-11-30 ENCOUNTER — Encounter (HOSPITAL_COMMUNITY): Payer: Self-pay | Admitting: Obstetrics & Gynecology

## 2020-11-30 ENCOUNTER — Inpatient Hospital Stay (HOSPITAL_COMMUNITY)
Admission: AD | Admit: 2020-11-30 | Discharge: 2020-11-30 | DRG: 833 | Disposition: A | Discharge status: Home / Self Care | Payer: Medicaid Other | Attending: Obstetrics and Gynecology | Admitting: Obstetrics and Gynecology

## 2020-11-30 DIAGNOSIS — Z8249 Family history of ischemic heart disease and other diseases of the circulatory system: Secondary | ICD-10-CM

## 2020-11-30 DIAGNOSIS — O9952 Diseases of the respiratory system complicating childbirth: Secondary | ICD-10-CM | POA: Diagnosis present

## 2020-11-30 DIAGNOSIS — Z20822 Contact with and (suspected) exposure to covid-19: Secondary | ICD-10-CM | POA: Diagnosis present

## 2020-11-30 DIAGNOSIS — O210 Mild hyperemesis gravidarum: Secondary | ICD-10-CM | POA: Diagnosis present

## 2020-11-30 DIAGNOSIS — K219 Gastro-esophageal reflux disease without esophagitis: Secondary | ICD-10-CM | POA: Diagnosis present

## 2020-11-30 DIAGNOSIS — M329 Systemic lupus erythematosus, unspecified: Secondary | ICD-10-CM | POA: Diagnosis present

## 2020-11-30 DIAGNOSIS — O479 False labor, unspecified: Secondary | ICD-10-CM | POA: Diagnosis present

## 2020-11-30 DIAGNOSIS — O43193 Other malformation of placenta, third trimester: Secondary | ICD-10-CM | POA: Diagnosis present

## 2020-11-30 DIAGNOSIS — O99344 Other mental disorders complicating childbirth: Secondary | ICD-10-CM | POA: Diagnosis present

## 2020-11-30 DIAGNOSIS — Z3A37 37 weeks gestation of pregnancy: Secondary | ICD-10-CM | POA: Diagnosis not present

## 2020-11-30 DIAGNOSIS — Z880 Allergy status to penicillin: Secondary | ICD-10-CM | POA: Diagnosis not present

## 2020-11-30 DIAGNOSIS — Z888 Allergy status to other drugs, medicaments and biological substances status: Secondary | ICD-10-CM

## 2020-11-30 DIAGNOSIS — J45909 Unspecified asthma, uncomplicated: Secondary | ICD-10-CM | POA: Diagnosis present

## 2020-11-30 DIAGNOSIS — Z79899 Other long term (current) drug therapy: Secondary | ICD-10-CM | POA: Diagnosis not present

## 2020-11-30 DIAGNOSIS — O0990 Supervision of high risk pregnancy, unspecified, unspecified trimester: Secondary | ICD-10-CM

## 2020-11-30 DIAGNOSIS — Z3490 Encounter for supervision of normal pregnancy, unspecified, unspecified trimester: Secondary | ICD-10-CM

## 2020-11-30 LAB — TYPE AND SCREEN
ABO/RH(D): O POS
Antibody Screen: NEGATIVE

## 2020-11-30 LAB — RESP PANEL BY RT-PCR (FLU A&B, COVID) ARPGX2
Influenza A by PCR: NEGATIVE
Influenza B by PCR: NEGATIVE
SARS Coronavirus 2 by RT PCR: NEGATIVE

## 2020-11-30 LAB — CBC
HCT: 33.2 % — ABNORMAL LOW (ref 36.0–46.0)
Hemoglobin: 11.6 g/dL — ABNORMAL LOW (ref 12.0–15.0)
MCH: 32.3 pg (ref 26.0–34.0)
MCHC: 34.9 g/dL (ref 30.0–36.0)
MCV: 92.5 fL (ref 80.0–100.0)
Platelets: 167 10*3/uL (ref 150–400)
RBC: 3.59 MIL/uL — ABNORMAL LOW (ref 3.87–5.11)
RDW: 12.7 % (ref 11.5–15.5)
WBC: 12.1 10*3/uL — ABNORMAL HIGH (ref 4.0–10.5)
nRBC: 0 % (ref 0.0–0.2)

## 2020-11-30 LAB — RPR: RPR Ser Ql: NONREACTIVE

## 2020-11-30 MED ORDER — PHENYLEPHRINE 40 MCG/ML (10ML) SYRINGE FOR IV PUSH (FOR BLOOD PRESSURE SUPPORT): 80.0000 ug | PREFILLED_SYRINGE | INTRAVENOUS | Status: DC | PRN

## 2020-11-30 MED ORDER — EPHEDRINE 5 MG/ML INJ: 10.0000 mg | INTRAVENOUS | Status: DC | PRN

## 2020-11-30 MED ORDER — OXYCODONE-ACETAMINOPHEN 5-325 MG PO TABS: 2.0000 | ORAL_TABLET | ORAL | Status: DC | PRN

## 2020-11-30 MED ORDER — HYDROXYZINE HCL 50 MG PO TABS
25.0000 mg | ORAL_TABLET | Freq: Once | ORAL | Status: AC
  Administered 2020-11-30: 25 mg via ORAL
  Filled 2020-11-30: qty 1

## 2020-11-30 MED ORDER — DIPHENHYDRAMINE HCL 50 MG/ML IJ SOLN
12.5000 mg | INTRAMUSCULAR | Status: DC | PRN
Start: 2020-11-30 — End: 2020-11-30

## 2020-11-30 MED ORDER — FENTANYL CITRATE (PF) 100 MCG/2ML IJ SOLN
50.0000 ug | INTRAMUSCULAR | Status: DC | PRN
  Administered 2020-11-30: 50 ug via INTRAVENOUS
  Filled 2020-11-30: qty 2

## 2020-11-30 MED ORDER — LACTATED RINGERS IV SOLN: 500.0000 mL | INTRAVENOUS | Status: DC | PRN

## 2020-11-30 MED ORDER — OXYTOCIN BOLUS FROM INFUSION: 333.0000 mL | Freq: Once | INTRAVENOUS | Status: DC

## 2020-11-30 MED ORDER — SOD CITRATE-CITRIC ACID 500-334 MG/5ML PO SOLN: 30.0000 mL | ORAL | Status: DC | PRN

## 2020-11-30 MED ORDER — LACTATED RINGERS IV SOLN: 500.0000 mL | Freq: Once | INTRAVENOUS | Status: DC

## 2020-11-30 MED ORDER — OXYCODONE-ACETAMINOPHEN 5-325 MG PO TABS: 1.0000 | ORAL_TABLET | ORAL | Status: DC | PRN

## 2020-11-30 MED ORDER — ACETAMINOPHEN 325 MG PO TABS: 650.0000 mg | ORAL_TABLET | ORAL | Status: DC | PRN

## 2020-11-30 MED ORDER — ONDANSETRON HCL 4 MG/2ML IJ SOLN
4.0000 mg | Freq: Four times a day (QID) | INTRAMUSCULAR | Status: DC | PRN
  Administered 2020-11-30: 4 mg via INTRAVENOUS
  Filled 2020-11-30: qty 2

## 2020-11-30 MED ORDER — FENTANYL CITRATE (PF) 100 MCG/2ML IJ SOLN
50.0000 ug | INTRAMUSCULAR | Status: DC | PRN
  Administered 2020-11-30: 100 ug via INTRAVENOUS
  Filled 2020-11-30: qty 2

## 2020-11-30 MED ORDER — LIDOCAINE HCL (PF) 1 % IJ SOLN: 30.0000 mL | INTRAMUSCULAR | Status: DC | PRN

## 2020-11-30 MED ORDER — OXYTOCIN-SODIUM CHLORIDE 30-0.9 UT/500ML-% IV SOLN: 2.5000 [IU]/h | INTRAVENOUS | Status: DC

## 2020-11-30 MED ORDER — LACTATED RINGERS IV SOLN: INTRAVENOUS | Status: DC

## 2020-11-30 MED ORDER — FENTANYL-BUPIVACAINE-NACL 0.5-0.125-0.9 MG/250ML-% EP SOLN: 12.0000 mL/h | EPIDURAL | Status: DC | PRN

## 2020-11-30 NOTE — Progress Notes (Signed)
Patient ID: Meagan Hunter, female   DOB: Jan 09, 1991, 30 y.o.   MRN: [id number]  Labor Progress Note Meagan Hunter is a 30 y.o. G3P2002 at [redacted]w[redacted]d presented for possible labor which now appears to be prodromal.  S:  Pt sitting on the ball, FOB at bedside. Pt has been walking and took a rest. Contractions much less frequent and less intense. Expressed anxiety about going home due to previous labor which progressed from 4cm to complete in an hour. Unsure when she should come back in time to get her epidural.  O:  BP 110/65   Pulse 87   Temp 98.3 F (36.8 C) (Oral)   Resp 18   Ht 5\' 4"  (1.626 m)   Wt 150 lb (68 kg)   LMP 03/13/2020   SpO2 100%   BMI 25.75 kg/m  EFM: baseline 125 bpm/ moderate variability/ 15x15 accels/ no decels  Toco/IUPC: occasional Dilation: 4 Effacement (%): 70 Cervical Position: Posterior Station: -2 Presentation: Vertex Exam by:: [email or web address] CNM  A/P: 30 y.o. G3P2002 [redacted]w[redacted]d  1. Labor: Prodromal 2. FWB: Cat 1 3. Pain: well-controlled with deep breathing and FOB support 4. Long discussion on when she should return to the hospital with pt and FOB, formulated plan to return when q3-83min lasting 11m. They live [place]31[place] from the hospital. Offered vistaril prior to discharge and advised to go home and take a nap, then a bath. Copious reassurance given. Pt amenable to plan to discharge home.  12m, CNM, MSN, IBCLC Certified Nurse Midwife, [facility]

## 2020-11-30 NOTE — Progress Notes (Addendum)
Patient ID: Meagan Hunter, female   DOB: 01/17/1991, 30 y.o.   MRN: [id number]  Labor Progress Note Meagan Hunter is a 30 y.o. G3P2002 at [redacted]w[redacted]d presented for possible labor which now appears to be prodromal.  S:  Pt resting in bed, more comfortable after fentanyl and zofran. Still contracting occasionally and feeling them in her back and lower abdomen. Frustrated with lack of progress as RN reported last check remained 3-4cm.  O:  BP 120/70   Pulse 85   Temp 98 F (36.7 C) (Oral)   Resp 18   Ht 5\' 4"  (1.626 m)   Wt 150 lb (68 kg)   LMP 03/13/2020   SpO2 100%   BMI 25.75 kg/m  EFM: baseline 125 bpm/ moderate variability/ 15x15 accels/ no decels  Toco/IUPC: occasional SVE: Dilation: 4 Effacement (%): 70 Station: -2 Presentation: Vertex Exam by:: [email or web address] RN  A/P: 30 y.o. 26 [redacted]w[redacted]d  1. Labor: Prodromal 2. FWB: Cat 1 3. Pain: well-controlled with deep breathing, more annoying to pt than painful 4. Plan to have patient complete [redacted]w[redacted]d, then reevaluate labor and patient status. May go home if labor continues to be prodromal/latent.  [employer], CNM 10:58 AM

## 2020-11-30 NOTE — MAU Note (Addendum)
Pt reports to MAU c/o contractions every 4 minutes that started on 10/5 around 2130. Pt reports constant lower back pain. Denies any LOF or bleeding. +FM  Ctx pain score: 7/10  Back pain score: 7/10

## 2020-11-30 NOTE — Discharge Summary (Signed)
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 ---------------------------------------------------------------------- Biophysical Evaluation  Amniotic F.V:   Pocket => 2 cm             F. Tone:        Observed  F. Movement:    Observed                   N.S.T:          Reactive  F. Breathing:   Observed                   Score:          10/10 ---------------------------------------------------------------------- OB History  Gravidity:    3         Term:   2        Prem:   0        SAB:   0  TOP:          0       Ectopic:  0        Living: 2 ---------------------------------------------------------------------- Gestational Age  LMP:            35w 2d        Date:  03/13/20                 EDD:   12/18/20  Best:          Barbie Haggis 2d     Det. By:  LMP  (03/13/20)          EDD:   12/18/20 ---------------------------------------------------------------------- Impression  Antenatal testing due to lupus.  Testing is reassuring, BPP 10/10. ---------------------------------------------------------------------- Recommendations  Continue weekly antenatal testing till delivery . ----------------------------------------------------------------------                Clayton Lefort, MD Electronically Signed Final Report   11/17/2020 02:47 pm ----------------------------------------------------------------------   Future Appointments  Date Time Provider [place]  12/05/2020 10:10 AM Elvera Maria, CNM [facility]  12/06/2020  9:15 AM [facility]ST [facility]  12/13/2020  9:15 AM [facility] NST [facility]   Discharge Condition: Stable  Discharge disposition: 01-Home or Self Care       Discharge Instructions     Discharge patient   Complete by: As directed    Discharge disposition: 01-Home or Self Care   Discharge patient date: 11/30/2020      Allergies as of 11/30/2020  Reactions   Zoloft [sertraline] Other (See Comments)   seizures   Penicillins Hives        Medication List     STOP taking these medications    Accu-Chek Guide test strip Generic drug: glucose blood   Accu-Chek Softclix Lancets lancets   blood glucose meter kit and supplies Kit       TAKE these medications    acetaminophen 325 MG tablet Commonly known as: Tylenol Take 2 tablets (650 mg total) by mouth every 4 (four) hours as needed.   albuterol 108 (90 Base) MCG/ACT inhaler Commonly known as: VENTOLIN HFA Inhale 2 puffs into the lungs every 6 (six) hours as needed for wheezing or shortness of breath.   calcium carbonate 500 MG chewable tablet Commonly known as: TUMS - dosed in mg elemental calcium Chew 1 tablet by  mouth daily.   cyclobenzaprine 10 MG tablet Commonly known as: FLEXERIL Take 1 tablet (10 mg total) by mouth 2 (two) times daily as needed for muscle spasms.   docusate sodium 100 MG capsule Commonly known as: COLACE Take 1 capsule (100 mg total) by mouth every 12 (twelve) hours.   doxylamine (Sleep) 25 MG tablet Commonly known as: UNISOM Take 25 mg by mouth at bedtime as needed.   hydrocortisone 25 MG suppository Commonly known as: ANUSOL-HC Place 1 suppository (25 mg total) rectally 2 (two) times daily.   ondansetron 8 MG disintegrating tablet Commonly known as: Zofran ODT Take 1 tablet (8 mg total) by mouth every 8 (eight) hours as needed for nausea or vomiting.   pantoprazole 20 MG tablet Commonly known as: PROTONIX Take 1 tablet (20 mg total) by mouth daily.   polyethylene glycol 17 g packet Commonly known as: MIRALAX / GLYCOLAX Take 17 g by mouth daily.   pramoxine 1 % foam Commonly known as: PROCTOFOAM Place 1 application rectally 3 (three) times daily as needed for anal itching.   PRENATAL VITAMIN PO Take by mouth.   promethazine 25 MG tablet Commonly known as: PHENERGAN Take 1 tablet (25 mg total) by mouth every 6 (six) hours as needed for nausea or vomiting.       Signed: Gabriel Carina, MSN, CNM, [place] 11/30/2020, 4:30 PM
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
## 2020-11-30 NOTE — Progress Notes (Signed)
Patient ID: Meagan Hunter, female   DOB: 17-Jul-1990, 30 y.o.   MRN: [id number]  Labor Progress Note Meagan Hunter is a 30 y.o. G3P2002 at [redacted]w[redacted]d presented for possible labor which now appears to be prodromal.  S:  Meagan Hunter complete, contractions have moved out of her back and are now q4-59min. Wants to be monitored longer in case contractions continue to intensify.  O:  BP 110/65   Pulse 87   Temp 98.3 F (36.8 C) (Oral)   Resp 18   Ht 5\' 4"  (1.626 m)   Wt 150 lb (68 kg)   LMP 03/13/2020   SpO2 100%   BMI 25.75 kg/m  EFM: baseline 125 bpm/ moderate variability/ 15x15 accels/ no decels  Toco/IUPC: occasional SVE: Declines SVE, anxious she will be the same and feel discouraged.  A/P: 30 y.o. G3P2002 [redacted]w[redacted]d  1. Labor: Prodromal 2. FWB: Cat 1 3. Pain: well-controlled with deep breathing and FOB support 4. Instructed pt to do pelvic tilts and walking, will reevaluate in an hour.  [redacted]w[redacted]d, CNM, MSN, IBCLC Certified Nurse Midwife, [facility]

## 2020-11-30 NOTE — Progress Notes (Signed)
Pt reports she lost mucus plug in shower and had bloody show

## 2020-12-01 LAB — CULTURE, STREPTOCOCCUS GRP B W/SUSCEPT
MICRO NUMBER:: 12457993
SPECIMEN QUALITY:: ADEQUATE

## 2020-12-02 LAB — CULTURE, BETA STREP (GROUP B ONLY)

## 2020-12-02 NOTE — Progress Notes (Signed)
NST:  Baseline: 130 bpm, Variability: Good {> 6 bpm), Accelerations: Reactive, and Decelerations: Absent  Patient seen and assessed by nursing staff.  Agree with documentation and plan.

## 2020-12-05 ENCOUNTER — Ambulatory Visit (INDEPENDENT_AMBULATORY_CARE_PROVIDER_SITE_OTHER): Payer: Medicaid Other | Admitting: Advanced Practice Midwife

## 2020-12-05 ENCOUNTER — Other Ambulatory Visit: Payer: Self-pay

## 2020-12-05 VITALS — BP 113/74 | HR 98 | Wt 150.0 lb

## 2020-12-05 DIAGNOSIS — O0990 Supervision of high risk pregnancy, unspecified, unspecified trimester: Secondary | ICD-10-CM

## 2020-12-05 DIAGNOSIS — Z3A38 38 weeks gestation of pregnancy: Secondary | ICD-10-CM

## 2020-12-05 DIAGNOSIS — M329 Systemic lupus erythematosus, unspecified: Secondary | ICD-10-CM

## 2020-12-05 DIAGNOSIS — O479 False labor, unspecified: Secondary | ICD-10-CM

## 2020-12-05 DIAGNOSIS — O99891 Other specified diseases and conditions complicating pregnancy: Secondary | ICD-10-CM

## 2020-12-05 NOTE — Patient Instructions (Signed)
Things to Try After 37 weeks to Encourage Labor/Get Ready for Labor:    Try the [employer] at www.milescircuit.com daily to improve baby's position and encourage the onset of labor.  Walk a little and rest a little every day.  Change positions often.  Cervical Ripening: May try one or both Red Raspberry Leaf capsules or tea:  two 300mg or 400mg tablets with each meal, 2-3 times a day, or 1-3 cups of tea daily  Potential Side Effects Of Raspberry Leaf:  Most women do not experience any side effects from drinking raspberry leaf tea. However, nausea and loose stools are possible   Evening Primrose Oil capsules: take 1 capsule by mouth and place one capsule in the vagina every night.    Some of the potential side effects:  Upset stomach  Loose stools or diarrhea  Headaches  Nausea  Sex can also help the cervix ripen and encourage labor onset.    Labor Precautions Reasons to come to MAU at [facility]:  1.  Contractions are  5 minutes apart or less, each last 1 minute, these have been going on for 1-2 hours, and you cannot walk or talk during them 2.  You have a large gush of fluid, or a trickle of fluid that will not stop and you have to wear a pad 3.  You have bleeding that is bright red, heavier than spotting--like menstrual bleeding (spotting can be normal in early labor or after a check of your cervix) 4.  You do not feel the baby moving like he/she normally does  
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
## 2020-12-06 ENCOUNTER — Other Ambulatory Visit: Payer: Medicaid Other

## 2020-12-07 ENCOUNTER — Other Ambulatory Visit: Payer: Self-pay

## 2020-12-07 ENCOUNTER — Ambulatory Visit (INDEPENDENT_AMBULATORY_CARE_PROVIDER_SITE_OTHER): Payer: Medicaid Other

## 2020-12-07 ENCOUNTER — Ambulatory Visit: Payer: Medicaid Other | Admitting: *Deleted

## 2020-12-07 VITALS — BP 116/70 | HR 78

## 2020-12-07 DIAGNOSIS — O99891 Other specified diseases and conditions complicating pregnancy: Secondary | ICD-10-CM | POA: Diagnosis not present

## 2020-12-07 DIAGNOSIS — M329 Systemic lupus erythematosus, unspecified: Secondary | ICD-10-CM

## 2020-12-07 IMAGING — US US FETAL BPP W/ NON-STRESS
1 series · 11 of 11 positions shown · non-contrast
Comparison: none

[Series 1: us fetal bpp w/ non-stress · 11 acquisitions, 11 frames shown]
[im 1/11]
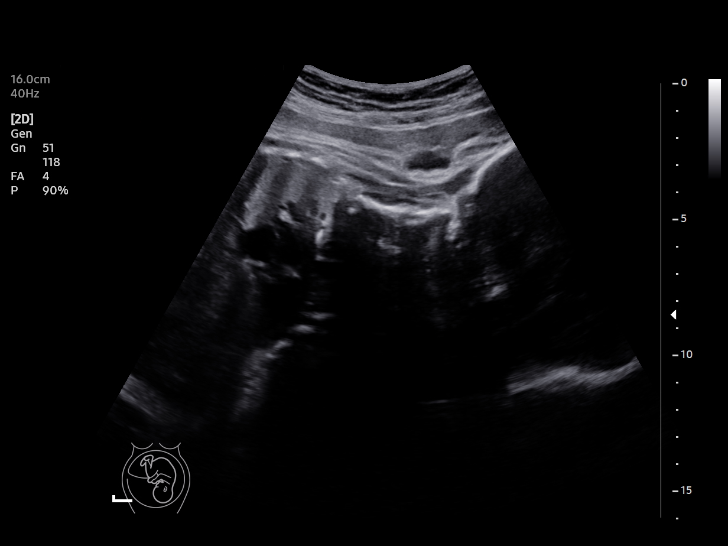
[im 2/11]
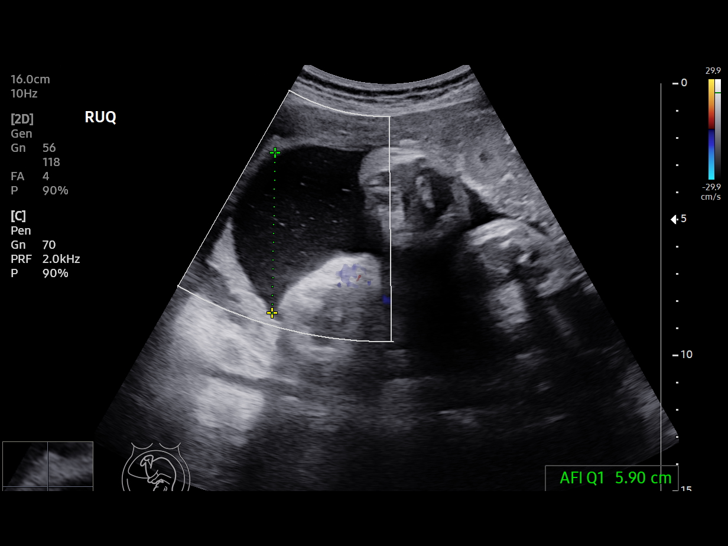
[im 3/11]
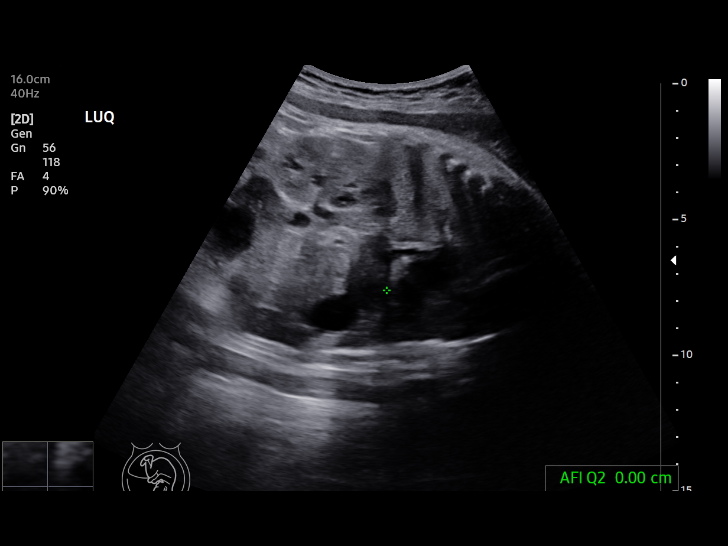
[im 4/11]
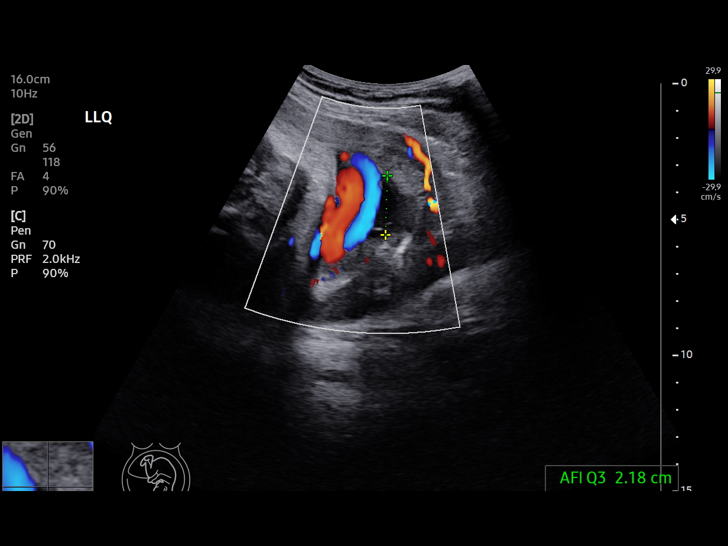
[im 5/11]
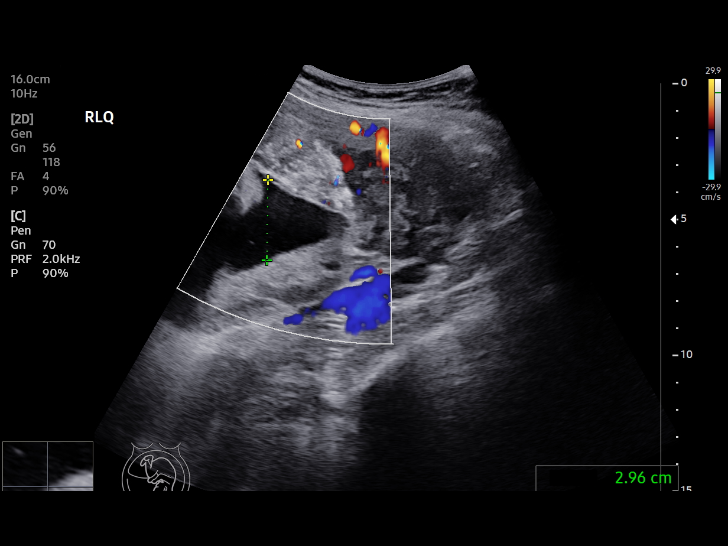
[im 6/11]
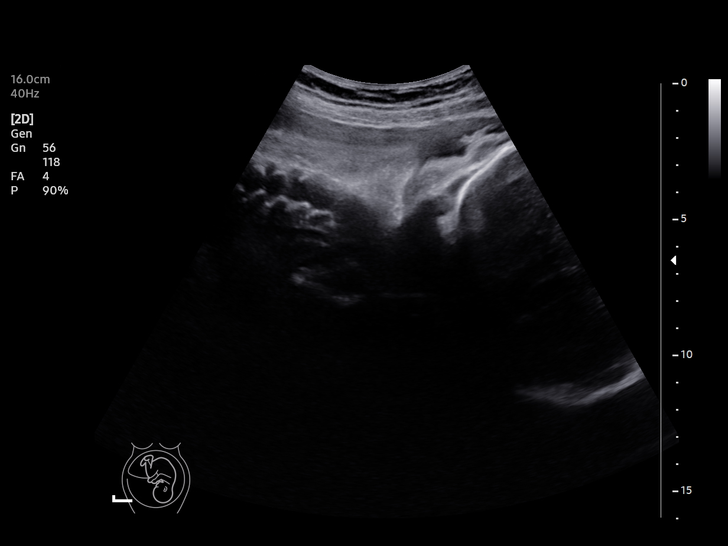
[im 7/11]
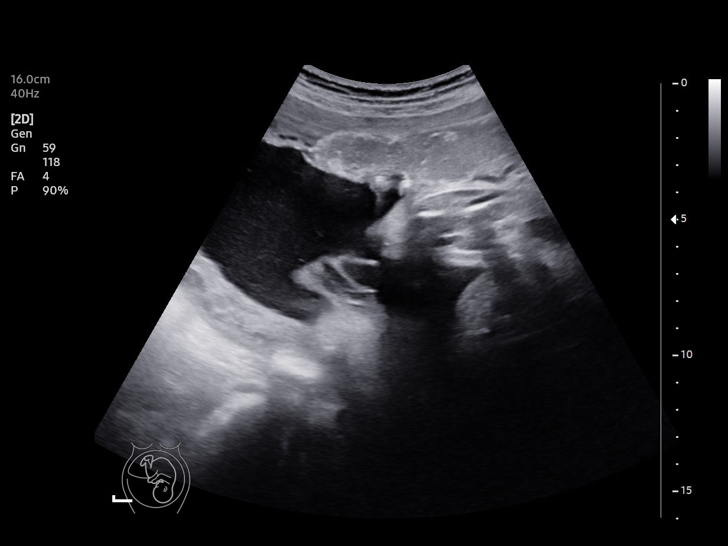
[im 8/11]
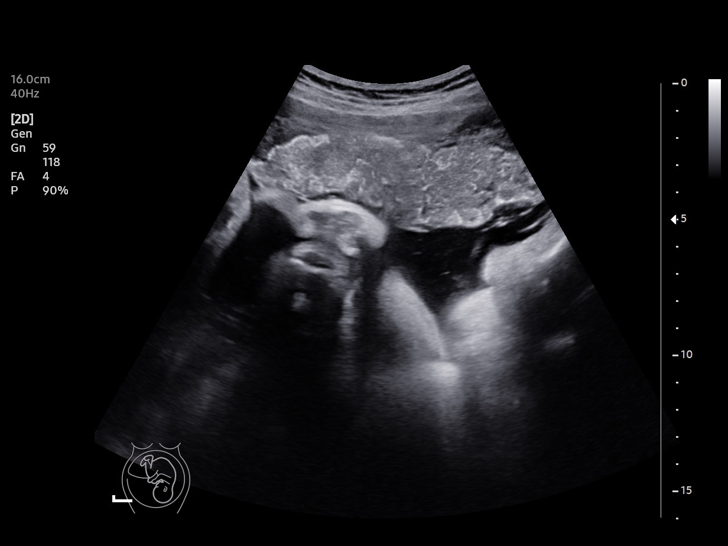
[im 9/11]
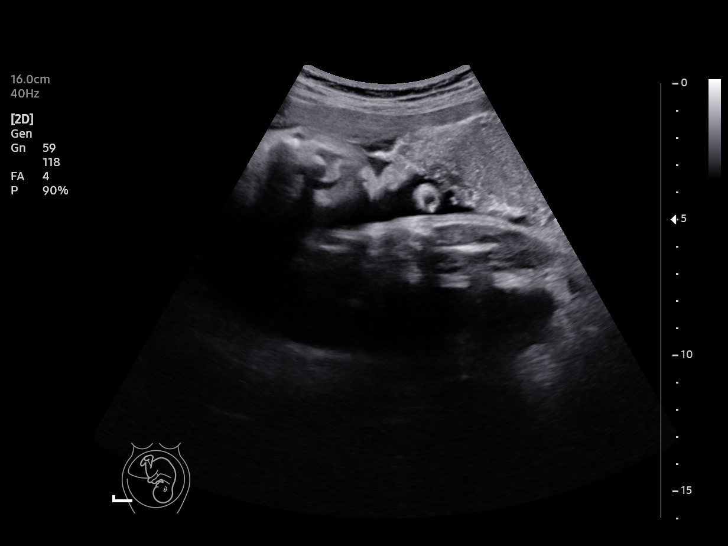
[im 10/11]
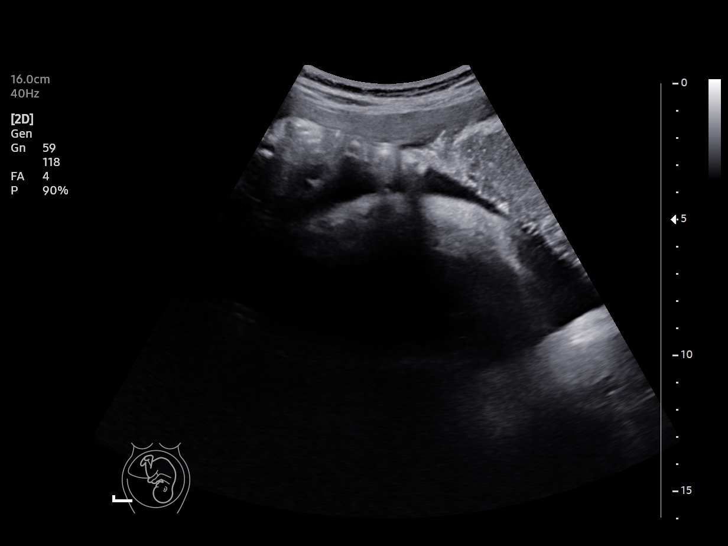
[im 11/11]
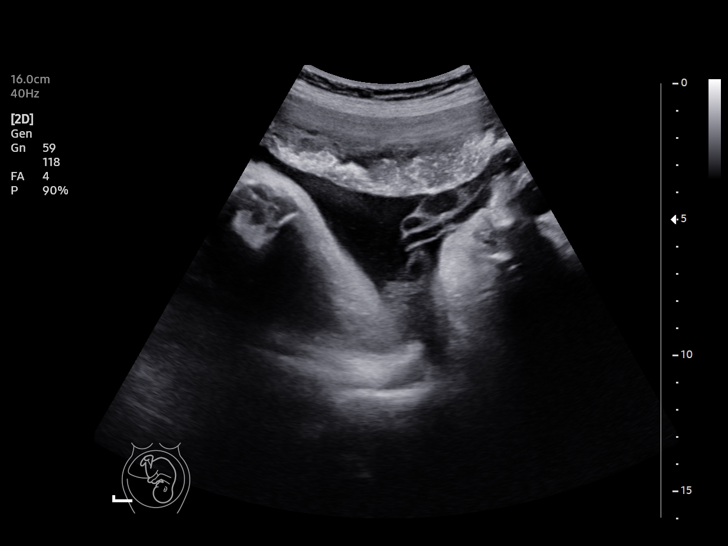

[11 of 11 positions shown; findings below may reference images not displayed]

[REDACTED]care at

 1  US FETAL BPP W/NONSTRESS              76818.4     JASMINE

Service(s) Provided

Indications

 38 weeks gestation of pregnancy
 Systemic lupus complicating pregnancy,         O26.893, [P5]
 third trimester
Fetal Evaluation

 Num Of Fetuses:         1
 Preg. Location:         Intrauterine
 Cardiac Activity:       Observed
 Presentation:           Cephalic

 Amniotic Fluid
 AFI FV:      Within normal limits

 AFI Sum(cm)     %Tile       Largest Pocket(cm)
 11.1            35

 RUQ(cm)       RLQ(cm)       LUQ(cm)        LLQ(cm)
 5.9           3             0

 Comment:    Breathing noted intermittently, but not sustained.
Biophysical Evaluation
 Amniotic F.V:   Pocket => 2 cm             F. Tone:        Observed
 F. Movement:    Observed                   N.S.T:          Reactive
 F. Breathing:   Not Observed               Score:          [DATE]
OB History

 Gravidity:    3         Term:   2        Prem:   0        SAB:   0
 TOP:          0       Ectopic:  0        Living: 2
Gestational Age

 LMP:           38w 3d        Date:  [DATE]                  EDD:   [DATE]
 Best:          38w 3d     Det. By:  LMP  ([DATE])          EDD:   [DATE]
Impression

 NST is reactive. BPP [DATE].
Recommendations

 Continue weekly antenatal testing till delivery .

## 2020-12-07 NOTE — Progress Notes (Signed)

## 2020-12-11 ENCOUNTER — Encounter (HOSPITAL_COMMUNITY): Payer: Self-pay | Admitting: Obstetrics and Gynecology

## 2020-12-11 ENCOUNTER — Other Ambulatory Visit: Payer: Self-pay

## 2020-12-11 ENCOUNTER — Inpatient Hospital Stay (HOSPITAL_COMMUNITY)
Admission: AD | Admit: 2020-12-11 | Discharge: 2020-12-11 | Disposition: A | Discharge status: Home / Self Care | Payer: Medicaid Other | Attending: Family Medicine | Admitting: Family Medicine

## 2020-12-11 DIAGNOSIS — O471 False labor at or after 37 completed weeks of gestation: Secondary | ICD-10-CM | POA: Diagnosis not present

## 2020-12-11 DIAGNOSIS — Z3A39 39 weeks gestation of pregnancy: Secondary | ICD-10-CM | POA: Diagnosis not present

## 2020-12-11 DIAGNOSIS — O0990 Supervision of high risk pregnancy, unspecified, unspecified trimester: Secondary | ICD-10-CM

## 2020-12-11 NOTE — MAU Note (Signed)
Pt reports contractions q 3-4 minutes, denies bleeding or ROM, reports good fetal movement.

## 2020-12-11 NOTE — MAU Provider Note (Addendum)
None      S: Meagan Hunter is a 30 y.o. G3P2002 at [redacted]w[redacted]d  who presents to MAU today complaining contractions q 3-4 minutes for the past couple of hours. S  O: BP 124/75 (BP Location: Right Arm)   Pulse 93   Temp 98.9 F (37.2 C) (Oral)   Resp 17   Ht 5\' 4"  (1.626 m)   Wt 68.5 kg   LMP 03/13/2020   SpO2 100%   BMI 25.92 kg/m    Cervical exam:  Dilation: 4 Effacement (%): 50 Cervical Position: Posterior Station: -2 Presentation: Vertex Exam by:: weston,rn   Fetal Monitoring: Baseline: 130 Variability: moderate Accelerations: ++ Decelerations: none Contractions: every 3-4 minutes   A: SIUP at [redacted]w[redacted]d  False labor  P: Discharge to home  [redacted]w[redacted]d, DO 12/11/2020 6:32 PM

## 2020-12-12 ENCOUNTER — Other Ambulatory Visit: Payer: Self-pay | Admitting: Advanced Practice Midwife

## 2020-12-12 ENCOUNTER — Ambulatory Visit (INDEPENDENT_AMBULATORY_CARE_PROVIDER_SITE_OTHER): Payer: Medicaid Other | Admitting: Obstetrics and Gynecology

## 2020-12-12 VITALS — BP 122/82 | HR 87 | Wt 151.0 lb

## 2020-12-12 DIAGNOSIS — O0990 Supervision of high risk pregnancy, unspecified, unspecified trimester: Secondary | ICD-10-CM
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
## 2020-12-13 ENCOUNTER — Other Ambulatory Visit: Payer: Self-pay

## 2020-12-13 ENCOUNTER — Inpatient Hospital Stay (HOSPITAL_COMMUNITY)
Admission: AD | Admit: 2020-12-13 | Discharge: 2020-12-14 | DRG: 807 | Disposition: A | Discharge status: Home / Self Care | Payer: Medicaid Other | Attending: Obstetrics and Gynecology | Admitting: Obstetrics and Gynecology

## 2020-12-13 ENCOUNTER — Inpatient Hospital Stay (HOSPITAL_COMMUNITY): Payer: Medicaid Other

## 2020-12-13 ENCOUNTER — Inpatient Hospital Stay (HOSPITAL_COMMUNITY): Payer: Medicaid Other | Admitting: Anesthesiology

## 2020-12-13 ENCOUNTER — Other Ambulatory Visit: Payer: Medicaid Other

## 2020-12-13 ENCOUNTER — Encounter (HOSPITAL_COMMUNITY): Payer: Self-pay | Admitting: Family Medicine

## 2020-12-13 DIAGNOSIS — O43123 Velamentous insertion of umbilical cord, third trimester: Principal | ICD-10-CM | POA: Diagnosis present

## 2020-12-13 DIAGNOSIS — J45909 Unspecified asthma, uncomplicated: Secondary | ICD-10-CM | POA: Diagnosis present

## 2020-12-13 DIAGNOSIS — O9952 Diseases of the respiratory system complicating childbirth: Secondary | ICD-10-CM | POA: Diagnosis present

## 2020-12-13 DIAGNOSIS — Z3A39 39 weeks gestation of pregnancy: Secondary | ICD-10-CM | POA: Diagnosis not present

## 2020-12-13 DIAGNOSIS — Z87891 Personal history of nicotine dependence: Secondary | ICD-10-CM

## 2020-12-13 DIAGNOSIS — F319 Bipolar disorder, unspecified: Secondary | ICD-10-CM | POA: Diagnosis present

## 2020-12-13 DIAGNOSIS — O26893 Other specified pregnancy related conditions, third trimester: Secondary | ICD-10-CM | POA: Diagnosis present

## 2020-12-13 DIAGNOSIS — O43193 Other malformation of placenta, third trimester: Secondary | ICD-10-CM | POA: Diagnosis present

## 2020-12-13 DIAGNOSIS — Z20822 Contact with and (suspected) exposure to covid-19: Secondary | ICD-10-CM | POA: Diagnosis present

## 2020-12-13 DIAGNOSIS — O0990 Supervision of high risk pregnancy, unspecified, unspecified trimester: Secondary | ICD-10-CM

## 2020-12-13 LAB — TYPE AND SCREEN
ABO/RH(D): O POS
Antibody Screen: NEGATIVE

## 2020-12-13 LAB — CBC
HCT: 35.1 % — ABNORMAL LOW (ref 36.0–46.0)
Hemoglobin: 11.9 g/dL — ABNORMAL LOW (ref 12.0–15.0)
MCH: 31.9 pg (ref 26.0–34.0)
MCHC: 33.9 g/dL (ref 30.0–36.0)
MCV: 94.1 fL (ref 80.0–100.0)
Platelets: 193 10*3/uL (ref 150–400)
RBC: 3.73 MIL/uL — ABNORMAL LOW (ref 3.87–5.11)
RDW: 12.6 % (ref 11.5–15.5)
WBC: 9.7 10*3/uL (ref 4.0–10.5)
nRBC: 0 % (ref 0.0–0.2)

## 2020-12-13 LAB — RESP PANEL BY RT-PCR (FLU A&B, COVID) ARPGX2
Influenza A by PCR: NEGATIVE
Influenza B by PCR: NEGATIVE
SARS Coronavirus 2 by RT PCR: NEGATIVE

## 2020-12-13 LAB — RPR: RPR Ser Ql: NONREACTIVE

## 2020-12-13 MED ORDER — LACTATED RINGERS IV SOLN: INTRAVENOUS | Status: DC

## 2020-12-13 MED ORDER — ONDANSETRON HCL 4 MG/2ML IJ SOLN: 4.0000 mg | INTRAMUSCULAR | Status: DC | PRN

## 2020-12-13 MED ORDER — HYDROXYZINE HCL 10 MG PO TABS
10.0000 mg | ORAL_TABLET | Freq: Three times a day (TID) | ORAL | Status: DC | PRN
  Filled 2020-12-13: qty 1

## 2020-12-13 MED ORDER — LACTATED RINGERS IV SOLN: 500.0000 mL | INTRAVENOUS | Status: DC | PRN

## 2020-12-13 MED ORDER — OXYTOCIN-SODIUM CHLORIDE 30-0.9 UT/500ML-% IV SOLN
2.5000 [IU]/h | INTRAVENOUS | Status: DC
  Administered 2020-12-13: 2.5 [IU]/h via INTRAVENOUS
  Filled 2020-12-13: qty 500

## 2020-12-13 MED ORDER — IBUPROFEN 600 MG PO TABS
600.0000 mg | ORAL_TABLET | Freq: Four times a day (QID) | ORAL | Status: DC
  Administered 2020-12-13 – 2020-12-14 (×5): 600 mg via ORAL
  Filled 2020-12-13 (×4): qty 1

## 2020-12-13 MED ORDER — LIDOCAINE HCL (PF) 1 % IJ SOLN
INTRAMUSCULAR | Status: DC | PRN
  Administered 2020-12-13 (×2): 5 mL via EPIDURAL

## 2020-12-13 MED ORDER — LACTATED RINGERS IV SOLN
500.0000 mL | Freq: Once | INTRAVENOUS | Status: AC
  Administered 2020-12-13: 500 mL via INTRAVENOUS

## 2020-12-13 MED ORDER — PRENATAL MULTIVITAMIN CH
1.0000 | ORAL_TABLET | Freq: Every day | ORAL | Status: DC
  Administered 2020-12-14: 1 via ORAL
  Filled 2020-12-13: qty 1

## 2020-12-13 MED ORDER — PHENYLEPHRINE 40 MCG/ML (10ML) SYRINGE FOR IV PUSH (FOR BLOOD PRESSURE SUPPORT): 80.0000 ug | PREFILLED_SYRINGE | INTRAVENOUS | Status: DC | PRN

## 2020-12-13 MED ORDER — TETANUS-DIPHTH-ACELL PERTUSSIS 5-2.5-18.5 LF-MCG/0.5 IM SUSY: 0.5000 mL | PREFILLED_SYRINGE | Freq: Once | INTRAMUSCULAR | Status: DC

## 2020-12-13 MED ORDER — COCONUT OIL OIL
1.0000 "application " | TOPICAL_OIL | Status: DC | PRN
  Administered 2020-12-14: 1 via TOPICAL

## 2020-12-13 MED ORDER — ACETAMINOPHEN 325 MG PO TABS
650.0000 mg | ORAL_TABLET | ORAL | Status: DC | PRN
Start: 2020-12-13 — End: 2020-12-13

## 2020-12-13 MED ORDER — DIPHENHYDRAMINE HCL 50 MG/ML IJ SOLN
12.5000 mg | INTRAMUSCULAR | Status: DC | PRN
Start: 2020-12-13 — End: 2020-12-13

## 2020-12-13 MED ORDER — SENNOSIDES-DOCUSATE SODIUM 8.6-50 MG PO TABS
2.0000 | ORAL_TABLET | Freq: Every day | ORAL | Status: DC
  Administered 2020-12-14: 2 via ORAL
  Filled 2020-12-13: qty 2

## 2020-12-13 MED ORDER — FENTANYL CITRATE (PF) 100 MCG/2ML IJ SOLN
INTRAMUSCULAR | Status: AC
  Administered 2020-12-13: 50 ug
  Filled 2020-12-13: qty 2

## 2020-12-13 MED ORDER — EPHEDRINE 5 MG/ML INJ
10.0000 mg | INTRAVENOUS | Status: DC | PRN
Start: 2020-12-13 — End: 2020-12-13

## 2020-12-13 MED ORDER — OXYCODONE HCL 5 MG PO TABS: 10.0000 mg | ORAL_TABLET | ORAL | Status: DC | PRN

## 2020-12-13 MED ORDER — SOD CITRATE-CITRIC ACID 500-334 MG/5ML PO SOLN: 30.0000 mL | ORAL | Status: DC | PRN

## 2020-12-13 MED ORDER — OXYTOCIN-SODIUM CHLORIDE 30-0.9 UT/500ML-% IV SOLN
1.0000 m[IU]/min | INTRAVENOUS | Status: DC
  Administered 2020-12-13: 2 m[IU]/min via INTRAVENOUS

## 2020-12-13 MED ORDER — LIDOCAINE HCL (PF) 1 % IJ SOLN: 30.0000 mL | INTRAMUSCULAR | Status: DC | PRN

## 2020-12-13 MED ORDER — ONDANSETRON HCL 4 MG PO TABS: 4.0000 mg | ORAL_TABLET | ORAL | Status: DC | PRN

## 2020-12-13 MED ORDER — PHENYLEPHRINE 40 MCG/ML (10ML) SYRINGE FOR IV PUSH (FOR BLOOD PRESSURE SUPPORT)
80.0000 ug | PREFILLED_SYRINGE | INTRAVENOUS | Status: DC | PRN
Start: 2020-12-13 — End: 2020-12-13

## 2020-12-13 MED ORDER — WITCH HAZEL-GLYCERIN EX PADS: 1.0000 "application " | MEDICATED_PAD | CUTANEOUS | Status: DC | PRN

## 2020-12-13 MED ORDER — TERBUTALINE SULFATE 1 MG/ML IJ SOLN: 0.2500 mg | Freq: Once | INTRAMUSCULAR | Status: DC | PRN

## 2020-12-13 MED ORDER — OXYCODONE-ACETAMINOPHEN 5-325 MG PO TABS: 2.0000 | ORAL_TABLET | ORAL | Status: DC | PRN

## 2020-12-13 MED ORDER — DIPHENHYDRAMINE HCL 25 MG PO CAPS: 25.0000 mg | ORAL_CAPSULE | Freq: Four times a day (QID) | ORAL | Status: DC | PRN

## 2020-12-13 MED ORDER — ONDANSETRON HCL 4 MG/2ML IJ SOLN
4.0000 mg | Freq: Four times a day (QID) | INTRAMUSCULAR | Status: DC | PRN
Start: 2020-12-13 — End: 2020-12-13
  Administered 2020-12-13: 4 mg via INTRAVENOUS
  Filled 2020-12-13: qty 2

## 2020-12-13 MED ORDER — SIMETHICONE 80 MG PO CHEW: 80.0000 mg | CHEWABLE_TABLET | ORAL | Status: DC | PRN

## 2020-12-13 MED ORDER — OXYCODONE-ACETAMINOPHEN 5-325 MG PO TABS: 1.0000 | ORAL_TABLET | ORAL | Status: DC | PRN

## 2020-12-13 MED ORDER — OXYCODONE HCL 5 MG PO TABS
5.0000 mg | ORAL_TABLET | ORAL | Status: DC | PRN
  Administered 2020-12-14: 5 mg via ORAL
  Filled 2020-12-13: qty 1

## 2020-12-13 MED ORDER — OXYTOCIN BOLUS FROM INFUSION
333.0000 mL | Freq: Once | INTRAVENOUS | Status: AC
Start: 2020-12-13 — End: 2020-12-13
  Administered 2020-12-13: 333 mL via INTRAVENOUS

## 2020-12-13 MED ORDER — BENZOCAINE-MENTHOL 20-0.5 % EX AERO
1.0000 "application " | INHALATION_SPRAY | CUTANEOUS | Status: DC | PRN
  Administered 2020-12-14: 1 via TOPICAL
  Filled 2020-12-13: qty 56

## 2020-12-13 MED ORDER — FENTANYL-BUPIVACAINE-NACL 0.5-0.125-0.9 MG/250ML-% EP SOLN
12.0000 mL/h | EPIDURAL | Status: DC | PRN
  Administered 2020-12-13: 12 mL/h via EPIDURAL
  Filled 2020-12-13: qty 250

## 2020-12-13 MED ORDER — ACETAMINOPHEN 325 MG PO TABS
650.0000 mg | ORAL_TABLET | ORAL | Status: DC | PRN
  Administered 2020-12-14 (×2): 650 mg via ORAL
  Filled 2020-12-13 (×2): qty 2

## 2020-12-13 MED ORDER — ONDANSETRON HCL 4 MG/2ML IJ SOLN
INTRAMUSCULAR | Status: AC
  Administered 2020-12-13: 4 mg via INTRAVENOUS
  Filled 2020-12-13: qty 2

## 2020-12-13 MED ORDER — DIBUCAINE (PERIANAL) 1 % EX OINT: 1.0000 "application " | TOPICAL_OINTMENT | CUTANEOUS | Status: DC | PRN

## 2020-12-13 NOTE — Anesthesia Preprocedure Evaluation (Signed)
Anesthesia Evaluation  Patient identified by MRN, date of birth, ID band Patient awake    Reviewed: Allergy & Precautions, NPO status , Patient's Chart, lab work & pertinent test results  Airway Mallampati: II  TM Distance: >3 FB Neck ROM: Full    Dental no notable dental hx. (+) Dental Advisory Given   Pulmonary asthma , former smoker,    Pulmonary exam normal        Cardiovascular negative cardio ROS Normal cardiovascular exam     Neuro/Psych PSYCHIATRIC DISORDERS Depression Bipolar Disorder negative neurological ROS     GI/Hepatic Neg liver ROS, GERD  ,  Endo/Other  negative endocrine ROSSLE  Renal/GU negative Renal ROS  negative genitourinary   Musculoskeletal negative musculoskeletal ROS (+)   Abdominal   Peds negative pediatric ROS (+)  Hematology negative hematology ROS (+)   Anesthesia Other Findings   Reproductive/Obstetrics negative OB ROS                             Anesthesia Physical Anesthesia Plan  ASA: 2  Anesthesia Plan: Epidural   Post-op Pain Management:    Induction:   PONV Risk Score and Plan:   Airway Management Planned:   Additional Equipment:   Intra-op Plan:   Post-operative Plan:   Informed Consent: I have reviewed the patients History and Physical, chart, labs and discussed the procedure including the risks, benefits and alternatives for the proposed anesthesia with the patient or authorized representative who has indicated his/her understanding and acceptance.     Dental advisory given  Plan Discussed with: Anesthesiologist  Anesthesia Plan Comments:         Anesthesia Quick Evaluation

## 2020-12-13 NOTE — H&P (Signed)
Meagan Hunter is a 30 y.o. G7P2002 female at [redacted]w[redacted]d presenting for elective IOL. Has had prodromal labor on and off for the past two weeks with a history of precipitous delivery and anxiety. OB History     Gravida  3   Para  2   Term  2   Preterm      AB      Living  2      SAB  0   IAB  0   Ectopic  0   Multiple      Live Births  2          Past Medical History:  Diagnosis Date   Asthma    Gastroparesis    GERD (gastroesophageal reflux disease)    Hyperemesis affecting pregnancy, antepartum    Lupus (HCC)    Past Surgical History:  Procedure Laterality Date   CYST EXCISION     Family History: family history includes Cancer in her mother; Hypertension in her father and mother; Thyroid cancer in her mother. Social History:  reports that she has quit smoking. Her smoking use included cigarettes. She has never used smokeless tobacco. She reports that she does not currently use alcohol. She reports that she does not use drugs.    Maternal Diabetes: No Genetic Screening: Normal Maternal Ultrasounds/Referrals: Normal Fetal Ultrasounds or other Referrals:  None Maternal Substance Abuse:  No Significant Maternal Medications:  None Significant Maternal Lab Results:  Group B Strep negative Other Comments:  None  Review of Systems  Constitutional:  Negative for fatigue and fever.  HENT:  Negative for congestion and sore throat.   Eyes:  Negative for photophobia and visual disturbance.  Respiratory:  Negative for cough and shortness of breath.   Gastrointestinal:  Positive for nausea (from anxiety).  Genitourinary:  Positive for pelvic pain. Negative for vaginal discharge.  Neurological:  Negative for syncope and headaches.  Psychiatric/Behavioral:  The patient is nervous/anxious.   Maternal Medical History:  Reason for admission: Nausea (from anxiety).   Contractions: Onset was 1 week or more ago.   Frequency: irregular.   Perceived severity is mild.   Fetal  activity: Perceived fetal activity is normal.   Last perceived fetal movement was within the past hour.   Prenatal complications: no prenatal complications Prenatal Complications - Diabetes: none.  Dilation: 4 Effacement (%): 50 Station: 0 Exam by:: J Jonas Goh CNM Blood pressure 111/71, pulse 86, temperature 98.6 F (37 C), temperature source Oral, resp. rate 16, height 5\' 4"  (1.626 m), weight 152 lb (68.9 kg), last menstrual period 03/13/2020. Maternal Exam:  Uterine Assessment: Contraction strength is mild.  Contraction duration is 45 seconds. Contraction frequency is irregular.  Abdomen: Patient reports no abdominal tenderness. Fetal presentation: vertex Introitus: Normal vulva. Normal vagina.  Amniotic fluid character: not assessed. Pelvis: adequate for delivery.   Cervix: Cervix evaluated by digital exam.   Dilation: 4 Effacement (%): 50 Cervical Position: Posterior Station: 0 Presentation: Vertex Exam by:: [email or web address] CNM   Fetal Exam Fetal Monitor Review: Mode: ultrasound.   Baseline rate: 130.  Variability: moderate (6-25 bpm).   Pattern: accelerations present and no decelerations.   Fetal State Assessment: Category I - tracings are normal.  Physical Exam Vitals and nursing note reviewed.  Constitutional:      Appearance: Normal appearance.  HENT:     Head: Normocephalic.     Mouth/Throat:     Mouth: Mucous membranes are moist.  Eyes:     Pupils: Pupils are  equal, round, and reactive to light.  Cardiovascular:     Rate and Rhythm: Normal rate and regular rhythm.  Pulmonary:     Effort: Pulmonary effort is normal.  Abdominal:     Palpations: Abdomen is soft.  Genitourinary:    General: Normal vulva.  Musculoskeletal:        General: Normal range of motion.  Skin:    General: Skin is warm.     Capillary Refill: Capillary refill takes less than 2 seconds.  Neurological:     Mental Status: She is alert and oriented to person, place, and time.  Psychiatric:         Mood and Affect: Mood normal.    Prenatal labs: ABO, Rh: --/--/O POS (10/19 0815) Antibody: NEG (10/19 0815) Rubella: Immune (04/11 0000) RPR: NON REACTIVE (10/06 0517)  HBsAg: Negative (04/11 0000)  HIV: Non-reactive (04/11 0000)  GBS: Negative/-- (10/04 0000)   Assessment/Plan: [id number] at [redacted]w[redacted]d  Admit to L&D for eIOL   Begin pitocin titration, encouraged pt to ambulate Can have epidural when ready, will assess for AROM after epidural placement  Bernerd Limbo 12/13/2020, 11:51 AM

## 2020-12-13 NOTE — Anesthesia Procedure Notes (Signed)
Epidural Patient location during procedure: OB Start time: 12/13/2020 11:56 AM End time: 12/13/2020 12:08 PM  Staffing Anesthesiologist: Heather Roberts, MD Performed: anesthesiologist   Preanesthetic Checklist Completed: patient identified, IV checked, site marked, risks and benefits discussed, monitors and equipment checked, pre-op evaluation and timeout performed  Epidural Patient position: sitting Prep: DuraPrep Patient monitoring: heart rate, cardiac monitor, continuous pulse ox and blood pressure Approach: midline Location: L2-L3 Injection technique: LOR saline  Needle:  Needle type: Tuohy  Needle gauge: 17 G Needle length: 9 cm Needle insertion depth: 5 cm Catheter size: 20 Guage Catheter at skin depth: 10 cm Test dose: negative and Other  Assessment Events: blood not aspirated, injection not painful, no injection resistance and negative IV test  Additional Notes Informed consent obtained prior to proceeding including risk of failure, 1% risk of PDPH, risk of minor discomfort and bruising.  Discussed rare but serious complications including epidural abscess, permanent nerve injury, epidural hematoma.  Discussed alternatives to epidural analgesia and patient desires to proceed.  Timeout performed pre-procedure verifying patient name, procedure, and platelet count.  Patient tolerated procedure well.

## 2020-12-13 NOTE — Discharge Summary (Signed)
Postpartum Discharge Summary     Patient Name: Meagan Hunter DOB: 03/17/1990 MRN: [id number]  Date of admission: 12/13/2020 Delivery date:12/13/2020  Delivering provider: WALKER, JAMILLA R  Date of discharge: 12/14/2020  Admitting diagnosis: Indication for care in labor and delivery, antepartum [O75.9] Intrauterine pregnancy: [redacted]w[redacted]d     Secondary diagnosis:  Active Problems:   Supervision of high risk pregnancy, antepartum   Bipolar 1 disorder, depressed (HCC)   Marginal insertion of umbilical cord affecting management of mother in third trimester   Indication for care in labor and delivery, antepartum  Additional problems: none    Discharge diagnosis: Term Pregnancy Delivered                                              Post partum procedures: None Augmentation: AROM and Pitocin Complications: None  Hospital course: Induction of Labor With Vaginal Delivery   30 y.o. yo G3P2002 at [redacted]w[redacted]d was admitted to the hospital 12/13/2020 for induction of labor.  Indication for induction: Elective.  Patient had an uncomplicated labor course as follows: Membrane Rupture Time/Date: 12:31 PM ,12/13/2020   Delivery Method:Vaginal, Spontaneous  Episiotomy: None  Lacerations:  None  Details of delivery can be found in separate delivery note.  Patient had a routine postpartum course. Patient is discharged home 12/14/20.  Newborn Data: Birth date:12/13/2020  Birth time:2:58 PM  Gender:Female  Living status:Living  Apgars:9 ,9  Weight:3300 g   Magnesium Sulfate received: No BMZ received: No Rhophylac:N/A MMR:No T-DaP: Given prenatally Flu: No Transfusion:No  Physical exam  Vitals:   12/13/20 1810 12/13/20 2148 12/14/20 0227 12/14/20 0549  BP: 120/87 106/68 112/69 125/74  Pulse: 73 86 75 78  Resp: 17 18 18 18  Temp: 97.8 F (36.6 C) 98.6 F (37 C) 97.9 F (36.6 C) 97.7 F (36.5 C)  TempSrc: Oral Oral Oral Oral  SpO2: 100% 96% 100% 100%  Weight:      Height:       General:  alert Lochia: appropriate Uterine Fundus: firm DVT Evaluation: No evidence of DVT seen on physical exam. Labs: Lab Results  Component Value Date   WBC 13.5 (H) 12/14/2020   HGB 10.9 (L) 12/14/2020   HCT 31.8 (L) 12/14/2020   MCV 93.3 12/14/2020   PLT 171 12/14/2020   CMP Latest Ref Rng & Units 08/16/2020  Glucose 70 - 99 mg/dL 108(H)  BUN 6 - 20 mg/dL 7  Creatinine 0.44 - 1.00 mg/dL 0.80  Sodium 135 - 145 mmol/L 139  Potassium 3.5 - 5.1 mmol/L 3.3(L)  Chloride 98 - 111 mmol/L 103  CO2 22 - 32 mmol/L 23  Calcium 8.9 - 10.3 mg/dL 9.2  Total Protein 6.5 - 8.1 g/dL 7.0  Total Bilirubin 0.3 - 1.2 mg/dL 0.9  Alkaline Phos 38 - 126 U/L 46  AST 15 - 41 U/L 23  ALT 0 - 44 U/L 18   Edinburgh Score: No flowsheet data found.   After visit meds:  Allergies as of 12/14/2020       Reactions   Zoloft [sertraline] Other (See Comments)   seizures   Penicillins Hives        Medication List     STOP taking these medications    calcium carbonate 500 MG chewable tablet Commonly known as: TUMS - dosed in mg elemental calcium   cyclobenzaprine 10 MG tablet Commonly known as: FLEXERIL     docusate sodium 100 MG capsule Commonly known as: COLACE   doxylamine (Sleep) 25 MG tablet Commonly known as: UNISOM   ondansetron 8 MG disintegrating tablet Commonly known as: Zofran ODT   pantoprazole 20 MG tablet Commonly known as: PROTONIX   polyethylene glycol 17 g packet Commonly known as: MIRALAX / GLYCOLAX   pramoxine 1 % foam Commonly known as: PROCTOFOAM   promethazine 25 MG tablet Commonly known as: PHENERGAN       TAKE these medications    acetaminophen 325 MG tablet Commonly known as: Tylenol Take 2 tablets (650 mg total) by mouth every 4 (four) hours as needed.   albuterol 108 (90 Base) MCG/ACT inhaler Commonly known as: VENTOLIN HFA Inhale 2 puffs into the lungs every 6 (six) hours as needed for wheezing or shortness of breath.   ibuprofen 600 MG  tablet Commonly known as: ADVIL Take 1 tablet (600 mg total) by mouth every 6 (six) hours.         Discharge home in stable condition Infant Feeding: Breast Infant Disposition:home with mother Discharge instruction: per After Visit Summary and Postpartum booklet. Activity: Advance as tolerated. Pelvic rest for 6 weeks.  Diet: routine diet Future Appointments: Future Appointments  Date Time Provider [place]  01/12/2021  9:10 AM Julianne Handler, CNM [facility]   Follow up Visit: Please schedule this patient for a In person postpartum visit in 4 weeks with the following provider: Any provider. Additional Postpartum F/U:Postpartum Depression checkup  Low risk pregnancy complicated by:  None Delivery mode:  Vaginal, Spontaneous  Anticipated Birth Control:   vasectomy   Renard Matter, MD, MPH OB Fellow, [facility]

## 2020-12-13 NOTE — Progress Notes (Signed)
Labor Progress Note Meagan Hunter is a 30 y.o. G3P2002 at [redacted]w[redacted]d presented for eIOL  S:  Comfortable after epidural placement, FOB at bedside for support.  O:  BP 123/86   Pulse (!) 156   Temp 98.2 F (36.8 C) (Oral)   Resp 18   Ht 5\' 4"  (1.626 m)   Wt 152 lb (68.9 kg)   LMP 03/13/2020   SpO2 100%   BMI 26.09 kg/m  EFM: baseline 130 bpm/ moderate variability/ 15x15 accels/ no decels  Toco/IUPC: q3-4min SVE: 4/60/0 Pitocin: 10 mu/min  AROM performed with return of clear fluid  A/P: 30 y.o. G3P2002 [redacted]w[redacted]d  1. Labor: progressing well (change in station/cervical position), AROM completed and tolerated well 2. FWB: Cat 1 3. Pain: well-controlled with epidural 4. Continue to titrate pitocin, pt repositioned into throne. RN to begin changing positions on peanut ball after 30-19min in throne.  Anticipate SVD.  72m, CNM, MSN, IBCLC Certified Nurse Midwife, [facility]

## 2020-12-13 NOTE — Lactation Note (Signed)
This note was copied from a baby's chart. Lactation Consultation Note  Patient Name: Meagan Hunter [id number] Date: 12/13/2020   Age:30 hours  When LC arrived in L&D, RN stopped and stated that CNM IBCLC helped with latching baby, plus Mom is experienced with breastfeeding.  Lactation will F/U on MBU.  [name] 12/13/2020, 4:14 PM

## 2020-12-13 NOTE — Lactation Note (Signed)
This note was copied from a baby's chart. Lactation Consultation Note  Patient Name: Meagan Hunter [id number] Date: 12/13/2020 Reason for consult: Initial assessment;Term Age:30 hours P3, term female infant. LC entered room, per mom, infant been sleepy mom was attempting breastfeed infant swaddled in blanket and without pillow support. Mom open to [facility] suggestion, mom un-swaddled infant and breastfeed infant skin to skin on her right breast using the cross cradle hold, infant was on and off the breast at first, but started sustaining the latch.  Infant was still breastfeeding after 10 minutes when LC left the room. LC discussed infant's input and output with parents. Mom made aware of O/P services, breastfeeding support groups, community resources, and our phone # for post-discharge questions.   Mom's plan: 1- Mom knows to breastfeed infant according to hunger cues, 8 to 12+ or more times within 24 hours, skin to skin. 2- Mom knows to call RN/LC for further latch assistance if needed. 3- Mom knows how to hand express and give infant back extra volume of colostrum by spoon if infant doesn't latch.  Maternal Data Has patient been taught Hand Expression?: Yes Does the patient have breastfeeding experience prior to this delivery?: Yes How long did the patient breastfeed?: Per mom, she breastfeed her 1st child for one week, 2nd child for 2 years who is currently 52 years old  Feeding Mother's Current Feeding Choice: Breast Milk  LATCH Score Latch: Repeated attempts needed to sustain latch, nipple held in mouth throughout feeding, stimulation needed to elicit sucking reflex.  Audible Swallowing: A few with stimulation  Type of Nipple: Everted at rest and after stimulation  Comfort (Breast/Nipple): Soft / non-tender  Hold (Positioning): Assistance needed to correctly position infant at breast and maintain latch.  LATCH Score: 7   Lactation Tools Discussed/Used     Interventions Interventions: Breast feeding basics reviewed;Assisted with latch;Skin to skin;Breast massage;Hand express;Breast compression;Adjust position;Support pillows;Expressed milk;Position options;Education;LC Services brochure  Discharge Pump:  (Mom plans to order an DEBP with her insurance.) WIC Program: No  Consult Status Consult Status: Follow-up Date: 12/14/20 Follow-up type: In-patient    Danelle Earthly 12/13/2020, 6:38 PM

## 2020-12-14 ENCOUNTER — Encounter (HOSPITAL_COMMUNITY): Payer: Self-pay | Admitting: *Deleted

## 2020-12-14 DIAGNOSIS — Z3A39 39 weeks gestation of pregnancy: Secondary | ICD-10-CM | POA: Diagnosis not present

## 2020-12-14 LAB — CBC
HCT: 31.8 % — ABNORMAL LOW (ref 36.0–46.0)
Hemoglobin: 10.9 g/dL — ABNORMAL LOW (ref 12.0–15.0)
MCH: 32 pg (ref 26.0–34.0)
MCHC: 34.3 g/dL (ref 30.0–36.0)
MCV: 93.3 fL (ref 80.0–100.0)
Platelets: 171 10*3/uL (ref 150–400)
RBC: 3.41 MIL/uL — ABNORMAL LOW (ref 3.87–5.11)
RDW: 12.7 % (ref 11.5–15.5)
WBC: 13.5 10*3/uL — ABNORMAL HIGH (ref 4.0–10.5)
nRBC: 0 % (ref 0.0–0.2)

## 2020-12-14 MED ORDER — IBUPROFEN 600 MG PO TABS: 600.0000 mg | ORAL_TABLET | Freq: Four times a day (QID) | ORAL | 0 refills | Status: DC

## 2020-12-14 MED ORDER — ACETAMINOPHEN 325 MG PO TABS: 650.0000 mg | ORAL_TABLET | ORAL | 0 refills | Status: AC | PRN

## 2020-12-14 NOTE — Lactation Note (Signed)
This note was copied from a baby's chart. Lactation Consultation Note  Patient Name: Boy Kyley Solow [id number]'[id number] Date: 12/14/2020 Reason for consult: Follow-up assessment;Term;Infant weight loss;Other (Comment);Breastfeeding assistance (5 % weight loss/ Bili check low / per mom baby having hsi circ in the nursery . LC reviewed the doc flow sheets with mom / updated/ WNL for esrly D/C . LC reviewed BF D/C teaching. see below.) Age:30 hours Latch score range according to doc flow sheets 7-9  Per mom having some nipple tenderness / LC offered to assess and mom receptive.  Right nipple clear . Left small bruise inner aspect of the nipple. Mom able to hand express and areola compressible.  LC recommended to prevent further soreness - prior to latching on the 1st breast /  Breast massage , hand express, pre - pump and reverse pressure if needed and latch with breast compressions until comfortable.  Apply EBM to nipples liberally.    Maternal Data Has patient been taught Hand Expression?: Yes Does the patient have breastfeeding experience prior to this delivery?: Yes How long did the patient breastfeed?: 1 year 2nd baby  Feeding Mother's Current Feeding Choice: Breast Milk  LATCH Score                     Lactation Tools Discussed/Used    Interventions    Discharge Discharge Education: Engorgement and breast care;Warning signs for feeding baby Pump: Manual;Personal;DEBP  Consult Status Consult Status: Complete Date: 12/14/20 Follow-up type: In-patient    Matilde Sprang Cleophus Mendonsa 12/14/2020, 12:30 PM

## 2020-12-14 NOTE — Clinical Social Work Maternal (Signed)
CLINICAL SOCIAL WORK MATERNAL/CHILD NOTE  Patient Details  Name: Meagan Hunter MRN: [id number] Date of Birth: 02/08/1991  Date:  12/14/2020  Clinical Social Worker Initiating Note:  Deziyah Pearla Mckinny, LCSW Date/Time: Initiated:  12/14/20/1145     Child's Name:  Meagan Hunter   Biological Parents:  Mother, Father (Meagan Hunter 06-18-1989)   Need for Interpreter:  None   Reason for Referral:  Behavioral Health Concerns   Address:  [address] [place] [address]    Phone number:  [phone] (home)     Additional phone number:   Household Members/Support Persons (HM/SP):   Household Member/Support Person 1, Household Member/Support Person 2, Household Member/Support Person 3   HM/SP Name Relationship DOB or Age  HM/SP -1 Meagan Hunter Significant Other 06-18-1989  HM/SP -2 Emsley Hunter Daughter 10  HM/SP -3 Robert Hunter Son 6  HM/SP -4        HM/SP -5        HM/SP -6        HM/SP -7        HM/SP -8          Natural Supports (not living in the home):      Professional Supports: None   Employment: Unemployed   Type of Work:     Education:  Some College   Homebound arranged:    Financial Resources:  Medicaid   Other Resources:      Cultural/Religious Considerations Which May Impact Care:    Strengths:  Ability to meet basic needs  , Home prepared for child     Psychotropic Medications:         Pediatrician:       Pediatrician List:   [place]    [place]    [place]    [place]    [place]    [place]      Pediatrician Fax Number:    Risk Factors/Current Problems:      Cognitive State:  Able to Concentrate  , Alert  , Insightful  , Linear Thinking     Mood/Affect:  Comfortable  , Calm  , Bright  , Relaxed     CSW Assessment: CSW received consult for hx of Bipolar, Anxiety, Depression.  CSW met with MOB to offer support and complete assessment.    CSW met with MOB at bedside and introduced  CSW role. CSW observed MOB holding the infant and FOB was present at bedside. CSW offered MOB privacy. MOB welcomed CSW to complete the assessment with FOB present. MOB confirmed the demographic information on file is correct then shared she lives in the home with her significant other and children (see chart above). CSW inquired how MOB has felt since giving birth. MOB reported feeling some pain but doing well. CSW inquired how MOB felt emotionally during the pregnancy. MOB shared she was felt tired but good overall. CSW inquired about MOB history of Bipolar, anxiety and depression. MOB disclosed she was diagnosed at age 12. MOB shared she has received both therapy and medication treatment over the years. The last time she was on medication was 4 years ago and feels she has managed well. MOB shared she has also done research and thinks she has a hormone imbalance since she notices a difference in her emotions before and after her menstrual cycle. MOB shared she practices coping mechanism such as relaxation techniques that include baths, meditation, and yoga. CSW praised MOB for her efforts. CSW inquired if MOB   experienced PPD. MOB reported she experienced PPD in 2016 after the birth of her son. MOB reported having a lot of emotions and shared  she saw a therapist for a few months that she felt was helpful. CSW provided education regarding the baby blues period vs. perinatal mood disorders, discussed treatment and gave resources for mental health follow up if concerns arise.  CSW recommended MOB complete a self-evaluation during the postpartum time period using the New Mom Checklist from Postpartum Progress and encouraged MOB to contact a medical professional if symptoms are noted at any time. MOB reported she feels comfortable reaching out to doctor or a therapist if she had concerns.   CSW provided review of Sudden Infant Death Syndrome (SIDS) precautions. MOB reported she has essential items for the infant  including a bassinet where the infant will sleep. MOB reported she is still deciding on a pediatrician of the infant's follow up care. MOB reported interest in WIC services. CSW provided the WIC information for follow up. CSW assessed MOB for additional needs. MOB reported no further needs.   CSW identifies no further need for intervention and no barriers to discharge at this time.    CSW Plan/Description:  Sudden Infant Death Syndrome (SIDS) Education, Neonatal Abstinence Syndrome (NAS) Education, Perinatal Mood and Anxiety Disorder (PMADs) Education, No Further Intervention Required/No Barriers to Discharge    Lella A Ranvir Renovato, LCSW 12/14/2020, 1:38 PM 

## 2020-12-14 NOTE — Anesthesia Postprocedure Evaluation (Signed)
Anesthesia Post Note  Patient: Meagan Hunter  Procedure(s) Performed: AN AD HOC LABOR EPIDURAL     Patient location during evaluation: Mother Baby Anesthesia Type: Epidural Level of consciousness: awake and alert Pain management: pain level controlled Vital Signs Assessment: post-procedure vital signs reviewed and stable Respiratory status: spontaneous breathing, nonlabored ventilation and respiratory function stable Cardiovascular status: stable Postop Assessment: no headache, no backache and epidural receding Anesthetic complications: no   No notable events documented.  Last Vitals:  Vitals:   12/14/20 0227 12/14/20 0549  BP: 112/69 125/74  Pulse: 75 78  Resp: 18 18  Temp: 36.6 C 36.5 C  SpO2: 100% 100%    Last Pain:  Vitals:   12/14/20 0549  TempSrc: Oral  PainSc: 7    Pain Goal: Patients Stated Pain Goal: 0 (12/13/20 1436)                 Micahel Omlor

## 2020-12-18 ENCOUNTER — Inpatient Hospital Stay (HOSPITAL_COMMUNITY): Admit: 2020-12-18 | Payer: Self-pay

## 2020-12-26 ENCOUNTER — Telehealth (HOSPITAL_COMMUNITY): Payer: Self-pay

## 2020-12-26 NOTE — Telephone Encounter (Signed)
No answer. Left message to return nurse call.  Marcelino Duster [facility] 12/26/2020,1405

## 2020-12-27 ENCOUNTER — Other Ambulatory Visit: Payer: Self-pay | Admitting: Obstetrics & Gynecology

## 2020-12-27 DIAGNOSIS — K649 Unspecified hemorrhoids: Secondary | ICD-10-CM

## 2020-12-27 MED ORDER — HYDROCORTISONE (PERIANAL) 2.5 % EX CREA: TOPICAL_CREAM | Freq: Two times a day (BID) | CUTANEOUS | 2 refills | Status: DC

## 2020-12-27 NOTE — Progress Notes (Signed)
Patient desired medication for symptomatic hemorrhoids, Anusol-HC prescribed.  Jaynie Collins, MD

## 2020-12-28 ENCOUNTER — Ambulatory Visit (INDEPENDENT_AMBULATORY_CARE_PROVIDER_SITE_OTHER): Payer: Medicaid Other | Admitting: Obstetrics and Gynecology

## 2020-12-28 ENCOUNTER — Other Ambulatory Visit: Payer: Self-pay

## 2020-12-28 ENCOUNTER — Encounter: Payer: Self-pay | Admitting: Obstetrics and Gynecology

## 2020-12-28 VITALS — BP 122/82 | HR 109 | Resp 16 | Ht 64.0 in | Wt 133.0 lb

## 2020-12-28 DIAGNOSIS — F419 Anxiety disorder, unspecified: Secondary | ICD-10-CM

## 2020-12-28 DIAGNOSIS — K649 Unspecified hemorrhoids: Secondary | ICD-10-CM

## 2020-12-28 MED ORDER — DIAZEPAM 5 MG PO TABS: 5.0000 mg | ORAL_TABLET | Freq: Once | ORAL | 0 refills | Status: AC

## 2020-12-28 NOTE — Addendum Note (Signed)
Addended by: Milas Hock A on: 12/28/2020 04:32 PM   Modules accepted: Orders

## 2020-12-28 NOTE — Progress Notes (Signed)
   GYNECOLOGY OFFICE VISIT NOTE  History:   Meagan Hunter is a 30 y.o. G3P3 here today for painful hemorrhoid.  It started a couple days ago. She tried some OTC measures that did not help. Did proctofoam and anusol and now bearable but still extremely painful.   She is s/p SVD on 10/19. No complications.   She denies any abnormal vaginal discharge, bleeding, pelvic pain or other concerns.   The following portions of the patient's history were reviewed and updated as appropriate: allergies, current medications, past family history, past medical history, past social history, past surgical history and problem list.   Review of Systems:  Pertinent items noted in HPI and remainder of comprehensive ROS otherwise negative.  Physical Exam:  BP 122/82   Pulse (!) 109   Resp 16   Ht 5\' 4"  (1.626 m)   Wt 133 lb (60.3 kg)   Breastfeeding Yes   BMI 22.83 kg/m  CONSTITUTIONAL: Well-developed, well-nourished female in no acute distress.  HEENT:  Normocephalic, atraumatic. External right and left ear normal. No scleral icterus.  NECK: Normal range of motion, supple, no masses noted on observation SKIN: No rash noted. Not diaphoretic. No erythema. No pallor. MUSCULOSKELETAL: Normal range of motion. No edema noted. NEUROLOGIC: Alert and oriented to person, place, and time. Normal muscle tone coordination. No cranial nerve deficit noted. PSYCHIATRIC: Normal mood and affect. Normal behavior. Normal judgment and thought content.  CARDIOVASCULAR: Normal heart rate noted RESPIRATORY: Effort and breath sounds normal, no problems with respiration noted ABDOMEN: No masses noted. No other overt distention noted.    PELVIC:  NEFG but 1.5 thrombosed hemorrhoid noted at anus  Labs and Imaging None  Assessment and Plan:    1. Hemorrhoids, unspecified hemorrhoid type - Discussed continuing comfort measures vs incision/drainage vs excision - Discussed the pros/cons to each and ultimately I recommended  excision. She would prefer excision as well and would feel more comfortable with a general surgeon. Will [facility] and see if she can be seen asap - She did not have hemorrhoids with her other deliveries - She is done with child bearing and plan is for spouse to have a vasectomy   Routine preventative health maintenance measures emphasized. Please refer to After Visit Summary for other counseling recommendations.   No follow-ups on file.  I spent  14  minutes dedicated to the care of this patient including pre-visit review of records, face to face time with the patient discussing her conditions and treatments and post visit orders.    [place], MD, FACOG Obstetrician & Gynecologist, [facility] for [facility], [facility]

## 2021-01-10 NOTE — Progress Notes (Signed)
Post Partum Video Visit Note   Provider location: Center for [facility]   Patient location: Home  I connected with Meagan Hunter on 01/12/21 at  9:10 AM EST by MyChart Video Encounter and verified that I am speaking with the correct person using two identifiers.   I discussed the limitations, risks, security and privacy concerns of performing an evaluation and management service virtually and the availability of in person appointments. I also discussed with the patient that there may be a patient responsible charge related to this service. The patient expressed understanding and agreed to proceed.  Meagan Hunter is a 30 y.o. G29P2002 female who presents for a postpartum visit. She is 4 weeks postpartum following a normal spontaneous vaginal delivery.  I have fully reviewed the prenatal and intrapartum course. The delivery was at 39.2 gestational weeks.  Anesthesia: epidural. Postpartum course has been difficult due to painful hemorrhoids. Baby is doing well. Baby is feeding by both breast and bottle - Enfamil AR. Bleeding thin lochia. Bowel function is abnormal: due to painful hemorrhoids . Bladder function is abnormal: having issues emptying bladder . Patient is not sexually active. Contraception method is vasectomy. Postpartum depression screening: negative: score 7.   The pregnancy intention screening data noted above was reviewed. Potential methods of contraception were discussed. The patient elected to proceed with vasectomy.   Edinburgh Postnatal Depression Scale - 01/12/21 0759       Edinburgh Postnatal Depression Scale:  In the Past 7 Days   I have been able to laugh and see the funny side of things. 0    I have looked forward with enjoyment to things. 1    I have blamed myself unnecessarily when things went wrong. 2    I have been anxious or worried for no good reason. 1    I have felt scared or panicky for no good reason. 0    Things have been getting  on top of me. 2    I have been so unhappy that I have had difficulty sleeping. 0    I have felt sad or miserable. 0    I have been so unhappy that I have been crying. 1    The thought of harming myself has occurred to me. 0    Edinburgh Postnatal Depression Scale Total 7             Health Maintenance Due  Topic Date Due   COVID-19 Vaccine (1) Never done   INFLUENZA VACCINE  09/25/2020    The following portions of the patient's history were reviewed and updated as appropriate: allergies, current medications, past family history, past medical history, past social history, past surgical history, and problem list.  Review of Systems Pertinent items are noted in HPI.  Objective:  There were no vitals taken for this visit.   General:  alert, cooperative, and no distress   Assessment:  Postpartum visit  Plan:   Essential components of care per ACOG recommendations:  1.  Mood and well being: Patient with negative depression screening today. Reviewed local resources for support.  - Patient tobacco use? No.   - hx of drug use? No.    2. Infant care and feeding:  -Patient currently breastmilk feeding? Yes. Discussed returning to work and pumping. Reviewed importance of draining breast regularly to support lactation.  -Social determinants of health (SDOH) reviewed in EPIC. No concerns  3. Sexuality, contraception and birth spacing - Patient does not want a  pregnancy in the next year.  Desired family size is 3 children.  - Reviewed forms of contraception in tiered fashion. Patient desired vasectomy today, will practice abstinence until.     4. Sleep and fatigue -Encouraged family/partner/community support of 4 hrs of uninterrupted sleep to help with mood and fatigue  5. Physical Recovery  - Discussed patients delivery and complications. She describes her labor as good. - Patient had a Vaginal, no problems at delivery. Patient had a 1st degree laceration. Perineal healing  reviewed. Patient expressed understanding - Patient has urinary incontinence? No. - Patient is safe to resume physical and sexual activity  6.  Health Maintenance - HM due items addressed Yes - Last pap smear 06/05/20  -Breast Cancer screening indicated? No.   7. Chronic Disease/Pregnancy Condition follow up:  Bipolar and Lupus  - PCP follow up   Total visit time: 11 minutes  Donette Larry, CNM Center for [employer], [facility]

## 2021-01-12 ENCOUNTER — Ambulatory Visit (INDEPENDENT_AMBULATORY_CARE_PROVIDER_SITE_OTHER): Payer: Medicaid Other | Admitting: Certified Nurse Midwife

## 2021-01-12 ENCOUNTER — Encounter: Payer: Self-pay | Admitting: Certified Nurse Midwife

## 2021-04-01 ENCOUNTER — Encounter (HOSPITAL_BASED_OUTPATIENT_CLINIC_OR_DEPARTMENT_OTHER): Payer: Self-pay | Admitting: Emergency Medicine

## 2021-04-01 ENCOUNTER — Emergency Department (HOSPITAL_BASED_OUTPATIENT_CLINIC_OR_DEPARTMENT_OTHER)
Admission: EM | Admit: 2021-04-01 | Discharge: 2021-04-01 | Disposition: A | Discharge status: Home / Self Care | Payer: Medicaid Other | Attending: Emergency Medicine | Admitting: Emergency Medicine

## 2021-04-01 ENCOUNTER — Ambulatory Visit (HOSPITAL_COMMUNITY)
Admission: EM | Admit: 2021-04-01 | Discharge: 2021-04-01 | Disposition: A | Discharge status: Home / Self Care | Payer: Medicaid Other | Attending: Medical Oncology | Admitting: Medical Oncology

## 2021-04-01 ENCOUNTER — Encounter (HOSPITAL_COMMUNITY): Payer: Self-pay | Admitting: *Deleted

## 2021-04-01 ENCOUNTER — Other Ambulatory Visit: Payer: Self-pay

## 2021-04-01 DIAGNOSIS — L03011 Cellulitis of right finger: Secondary | ICD-10-CM | POA: Diagnosis not present

## 2021-04-01 DIAGNOSIS — J45909 Unspecified asthma, uncomplicated: Secondary | ICD-10-CM | POA: Insufficient documentation

## 2021-04-01 MED ORDER — KETOROLAC TROMETHAMINE 60 MG/2ML IM SOLN
60.0000 mg | Freq: Once | INTRAMUSCULAR | Status: AC
  Administered 2021-04-01: 60 mg via INTRAMUSCULAR

## 2021-04-01 MED ORDER — KETOROLAC TROMETHAMINE 60 MG/2ML IM SOLN
INTRAMUSCULAR | Status: AC
  Filled 2021-04-01: qty 2

## 2021-04-01 MED ORDER — OXYCODONE-ACETAMINOPHEN 5-325 MG PO TABS: 1.0000 | ORAL_TABLET | Freq: Four times a day (QID) | ORAL | 0 refills | Status: DC | PRN

## 2021-04-01 MED ORDER — SENNOSIDES-DOCUSATE SODIUM 8.6-50 MG PO TABS: 1.0000 | ORAL_TABLET | Freq: Every evening | ORAL | 0 refills | Status: DC | PRN

## 2021-04-01 MED ORDER — DOXYCYCLINE HYCLATE 100 MG PO CAPS: 100.0000 mg | ORAL_CAPSULE | Freq: Two times a day (BID) | ORAL | 0 refills | Status: AC

## 2021-04-01 MED ORDER — IBUPROFEN 800 MG PO TABS: 800.0000 mg | ORAL_TABLET | Freq: Three times a day (TID) | ORAL | 0 refills | Status: DC | PRN

## 2021-04-01 MED ORDER — DOXYCYCLINE HYCLATE 100 MG PO TABS
100.0000 mg | ORAL_TABLET | Freq: Once | ORAL | Status: AC
  Administered 2021-04-01: 100 mg via ORAL
  Filled 2021-04-01: qty 1

## 2021-04-01 NOTE — ED Provider Notes (Signed)
Emergency Department Provider Note   I have reviewed the triage vital signs and the nursing notes.   HISTORY  Chief Complaint Finger Injury   HPI Meagan Hunter is a 31 y.o. female with past medical history reviewed below presents to the emergency department after being referred from urgent care with finger swelling and pain.  She is had developing symptoms over the past 2 days.  She was cleaning with Clorox solution and noticed some irritation.  She does pick at her cuticles and has developed increasing pain, swelling to the base of the nail on her right index finger.  Pain is developed into her entire finger now with some swelling.  Notes some discomfort into her hand.  No fevers.  Denies injury.    Past Medical History:  Diagnosis Date   Asthma    Gastroparesis    GERD (gastroesophageal reflux disease)    Hyperemesis affecting pregnancy, antepartum    Lupus (HCC)     Review of Systems  Constitutional: No fever/chills Cardiovascular: Denies chest pain. Respiratory: Denies shortness of breath. Gastrointestinal: No abdominal pain.   Genitourinary: Negative for dysuria. Musculoskeletal: Negative for back pain. Right finger and hand pain.  Skin: Right index finger swelling.  Neurological: Negative for headaches.   ____________________________________________   PHYSICAL EXAM:  VITAL SIGNS: ED Triage Vitals  Enc Vitals Group     BP 04/01/21 1503 119/88     Pulse Rate 04/01/21 1503 72     Resp 04/01/21 1503 18     Temp 04/01/21 1503 98 F (36.7 C)     Temp Source 04/01/21 1503 Oral     SpO2 04/01/21 1503 100 %     Weight 04/01/21 1503 135 lb (61.2 kg)     Height 04/01/21 1503 5\' 4"  (1.626 m)   Constitutional: Alert and oriented. Well appearing and in no acute distress. Eyes: Conjunctivae are normal. Head: Atraumatic. Nose: No congestion/rhinnorhea. Mouth/Throat: Mucous membranes are moist.  Neck: No stridor.   Cardiovascular: Normal rate, regular rhythm.  Good peripheral circulation. Grossly normal heart sounds.   Respiratory: Normal respiratory effort.  No retractions. Lungs CTAB. Gastrointestinal: Soft and nontender. No distention.  Musculoskeletal: Right index finger swelling with large paronychia noted at the base of the nail on the right index finger.  Range of motion slightly reduced secondary to swelling.  No particular tenderness over the flexor tendon compartment in the hand or forearm.  Very faint redness to the dorsum of the hand.  No warmth. No felon.  Neurologic:  Normal speech and language.  Skin:  Skin is warm and dry.  Paronychia and finger swelling as above.   ____________________________________________   PROCEDURES  Procedure(s) performed:   Marland KitchenIncision and Drainage  Date/Time: 04/01/2021 4:05 PM Performed by: 05/30/2021, MD Authorized by: Maia Plan, MD   Consent:    Consent obtained:  Verbal   Consent given by:  Patient   Risks, benefits, and alternatives were discussed: yes     Risks discussed:  Bleeding, damage to other organs, infection, incomplete drainage and pain   Alternatives discussed:  No treatment Universal protocol:    Procedure explained and questions answered to patient or proxy's satisfaction: yes     Patient identity confirmed:  Verbally with patient Location:    Indications for incision and drainage: Paronychia.   Size:  1 cm   Location:  Upper extremity   Upper extremity location:  Finger   Finger location:  R index finger Pre-procedure details:  Skin preparation:  Povidone-iodine Sedation:    Sedation type:  None Anesthesia:    Anesthesia method:  Topical application   Topical anesthesia: Pain ease spray. Procedure details:    Ultrasound guidance: no     Needle aspiration: yes     Needle size:  18 G   Incision types:  Stab incision   Incision depth:  Subcutaneous   Wound management:  Probed and deloculated   Drainage:  Purulent   Drainage amount:  Moderate   Wound  treatment:  Wound left open   Packing materials:  None Post-procedure details:    Procedure completion:  Tolerated well, no immediate complications   ____________________________________________   INITIAL IMPRESSION / ASSESSMENT AND PLAN / ED COURSE  Pertinent labs & imaging results that were available during my care of the patient were reviewed by me and considered in my medical decision making (see chart for details).   This patient is Presenting for Evaluation of finger pain and swelling, which does require a range of treatment options, and is a complaint that involves a moderate risk of morbidity and mortality.  The Differential Diagnoses include paronychia, felon, cellulitis, septic joint, flexor tenosynovitis.   Critical Interventions- I&D at bedside   Medications  doxycycline (VIBRA-TABS) tablet 100 mg (100 mg Oral Given 04/01/21 1638)    Reassessment after intervention: Pain improved.   I decided to review pertinent External Data, and in summary patient from UC, note reviewed.   Clinical Laboratory Tests: Considered need for laboratory test evaluation.  Patient has completely normal vital signs on arrival.  Her exam is consistent with finger swelling and paronychia.  My clinical suspicion for sepsis, osteomyelitis, necrotizing fasciitis is exceedingly low.   Radiologic Tests: Considered need for plain film imaging or CT of the finger/hand.  Patient has a visible area of purulent fluid collection consistent with paronychia which was drained at the bedside.  Exam not consistent with flexor tenosynovitis.  Very mild redness to the dorsum of the hand.  No injury to suspect fracture.  Will defer imaging for now.   Consult complete with Dr. Dion Saucier with ortho. He can see the patient in office tomorrow.   Medical Decision Making: Summary:  Patient presents emergency department from urgent care.  She has a paronychia with some right index finger swelling.  Exam is overall reassuring.   Not consistent with flexor tenosynovitis, sepsis, osteomyelitis, necrotizing fasciitis.  Performed bedside incision and drainage and applied a bulky dressing.  Plan for pain control and antibiotics.  We will touch base with hand surgery for close follow-up in the office.   Reevaluation with update and discussion with patient and husband. Plan for abx and ortho f/u in the AM.   Disposition: discharge   ____________________________________________  FINAL CLINICAL IMPRESSION(S) / ED DIAGNOSES  Final diagnoses:  Paronychia of finger of right hand     NEW OUTPATIENT MEDICATIONS STARTED DURING THIS VISIT:  Discharge Medication List as of 04/01/2021  4:20 PM     START taking these medications   Details  doxycycline (VIBRAMYCIN) 100 MG capsule Take 1 capsule (100 mg total) by mouth 2 (two) times daily for 7 days., Starting Sun 04/01/2021, Until Sun 04/08/2021, Normal    ibuprofen (ADVIL) 800 MG tablet Take 1 tablet (800 mg total) by mouth every 8 (eight) hours as needed for moderate pain., Starting Sun 04/01/2021, Normal    oxyCODONE-acetaminophen (PERCOCET/ROXICET) 5-325 MG tablet Take 1 tablet by mouth every 6 (six) hours as needed for severe pain., Starting  Sun 04/01/2021, Normal    senna-docusate (SENOKOT-S) 8.6-50 MG tablet Take 1 tablet by mouth at bedtime as needed for mild constipation., Starting Sun 04/01/2021, Normal        Note:  This document was prepared using Dragon voice recognition software and may include unintentional dictation errors.  Alona Bene, MD, [facility] Emergency Medicine    Clelia Trabucco, Arlyss Repress, MD 04/01/21 [phone]
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
## 2021-04-01 NOTE — ED Triage Notes (Signed)
Pt has swelling around the nail Of RT index finger. Skin is discolored and swollen. Pt reports feeling the swelling into the hand.

## 2021-04-01 NOTE — Discharge Instructions (Addendum)
Please go to the emergency room.

## 2021-04-01 NOTE — Discharge Instructions (Signed)
You were seen emerged from today with infection to the right index finger.  I was able to drain an area of infection and if placed a dressing which should stay in place.  I am starting on antibiotics and have also called in a small number of pain medications.  This medication can cause drowsiness and you cannot drive a car or take it with other sedation or anxiety medicines.  You cannot take this with alcohol.  It may cause constipation so have also called in some constipation medication.  Please take your antibiotics and call the hand surgeon listed first thing tomorrow morning to schedule a follow-up tomorrow.

## 2021-04-01 NOTE — Progress Notes (Signed)
Discussed case with ER provider.  Positive paronychia with index fingertip infection.  She did not really have any significant [place] signs.  Most of the involvement was dorsal.  She received a I&D in the emergency room, oral antibiotics, warm soaks, and I will see her likely tomorrow in the office to make sure she is having some clinical improvement.  Teryl Lucy, MD

## 2021-04-01 NOTE — ED Triage Notes (Signed)
Pt states was cleaning with Clorox 2 days, finger started burning, woke up yesterday finger was sore, this morning had a blister, this afternoon streaking up hand. 2nd digit right hand.

## 2021-06-25 ENCOUNTER — Emergency Department (HOSPITAL_BASED_OUTPATIENT_CLINIC_OR_DEPARTMENT_OTHER)
Admission: EM | Admit: 2021-06-25 | Discharge: 2021-06-25 | Disposition: A | Discharge status: Home / Self Care | Payer: Medicaid Other | Attending: Emergency Medicine | Admitting: Emergency Medicine

## 2021-06-25 ENCOUNTER — Other Ambulatory Visit: Payer: Self-pay

## 2021-06-25 ENCOUNTER — Emergency Department (HOSPITAL_BASED_OUTPATIENT_CLINIC_OR_DEPARTMENT_OTHER): Payer: Medicaid Other

## 2021-06-25 ENCOUNTER — Encounter (HOSPITAL_BASED_OUTPATIENT_CLINIC_OR_DEPARTMENT_OTHER): Payer: Self-pay

## 2021-06-25 DIAGNOSIS — R1033 Periumbilical pain: Secondary | ICD-10-CM | POA: Insufficient documentation

## 2021-06-25 DIAGNOSIS — R748 Abnormal levels of other serum enzymes: Secondary | ICD-10-CM | POA: Diagnosis not present

## 2021-06-25 DIAGNOSIS — R109 Unspecified abdominal pain: Secondary | ICD-10-CM | POA: Diagnosis present

## 2021-06-25 DIAGNOSIS — R1013 Epigastric pain: Secondary | ICD-10-CM | POA: Diagnosis not present

## 2021-06-25 LAB — COMPREHENSIVE METABOLIC PANEL
ALT: 8 U/L (ref 0–44)
AST: 13 U/L — ABNORMAL LOW (ref 15–41)
Albumin: 4.4 g/dL (ref 3.5–5.0)
Alkaline Phosphatase: 47 U/L (ref 38–126)
Anion gap: 9 (ref 5–15)
BUN: 15 mg/dL (ref 6–20)
CO2: 24 mmol/L (ref 22–32)
Calcium: 9.4 mg/dL (ref 8.9–10.3)
Chloride: 106 mmol/L (ref 98–111)
Creatinine, Ser: 0.8 mg/dL (ref 0.44–1.00)
GFR, Estimated: >60 mL/min (ref >60)
Glucose, Bld: 82 mg/dL (ref 70–99)
Potassium: 3.7 mmol/L (ref 3.5–5.1)
Sodium: 139 mmol/L (ref 135–145)
Total Bilirubin: 0.3 mg/dL (ref 0.3–1.2)
Total Protein: 7.1 g/dL (ref 6.5–8.1)

## 2021-06-25 LAB — CBC
HCT: 37.6 % (ref 36.0–46.0)
Hemoglobin: 12.8 g/dL (ref 12.0–15.0)
MCH: 30 pg (ref 26.0–34.0)
MCHC: 34 g/dL (ref 30.0–36.0)
MCV: 88.3 fL (ref 80.0–100.0)
Platelets: 211 10*3/uL (ref 150–400)
RBC: 4.26 MIL/uL (ref 3.87–5.11)
RDW: 12.3 % (ref 11.5–15.5)
WBC: 6.3 10*3/uL (ref 4.0–10.5)
nRBC: 0 % (ref 0.0–0.2)

## 2021-06-25 LAB — URINALYSIS, ROUTINE W REFLEX MICROSCOPIC
Bilirubin Urine: NEGATIVE
Glucose, UA: NEGATIVE mg/dL
Hgb urine dipstick: NEGATIVE
Ketones, ur: NEGATIVE mg/dL
Leukocytes,Ua: NEGATIVE
Nitrite: NEGATIVE
Specific Gravity, Urine: 1.031 — ABNORMAL HIGH (ref 1.005–1.030)
pH: 6 (ref 5.0–8.0)

## 2021-06-25 LAB — LIPASE, BLOOD: Lipase: 109 U/L — ABNORMAL HIGH (ref 11–51)

## 2021-06-25 LAB — PREGNANCY, URINE: Preg Test, Ur: NEGATIVE

## 2021-06-25 MED ORDER — IOHEXOL 300 MG/ML  SOLN
75.0000 mL | Freq: Once | INTRAMUSCULAR | Status: AC | PRN
  Administered 2021-06-25: 75 mL via INTRAVENOUS

## 2021-06-25 MED ORDER — ONDANSETRON 4 MG PO TBDP: 4.0000 mg | ORAL_TABLET | Freq: Three times a day (TID) | ORAL | 0 refills | Status: DC | PRN

## 2021-06-25 MED ORDER — TRAMADOL HCL 50 MG PO TABS: 50.0000 mg | ORAL_TABLET | Freq: Three times a day (TID) | ORAL | 0 refills | Status: DC | PRN

## 2021-06-25 MED ORDER — ONDANSETRON 4 MG PO TBDP
4.0000 mg | ORAL_TABLET | Freq: Once | ORAL | Status: AC | PRN
  Administered 2021-06-25: 4 mg via ORAL
  Filled 2021-06-25: qty 1

## 2021-06-25 MED ORDER — MORPHINE SULFATE (PF) 4 MG/ML IV SOLN
4.0000 mg | Freq: Once | INTRAVENOUS | Status: AC
  Administered 2021-06-25: 4 mg via INTRAVENOUS
  Filled 2021-06-25: qty 1

## 2021-06-25 MED ORDER — DICYCLOMINE HCL 20 MG PO TABS: 20.0000 mg | ORAL_TABLET | Freq: Two times a day (BID) | ORAL | 0 refills | Status: DC | PRN

## 2021-06-25 NOTE — ED Notes (Signed)
ED Provider at bedside. 

## 2021-06-25 NOTE — ED Notes (Signed)
Pt appears in pain, a/ox4. Pt states she has been having 8/10 epigastic pain x 1 month with n/v/d. Pt states she has not seen a MD because she has 3 small children at home. Denies pregnancy possibility. Pain increase on palp.  ?

## 2021-06-25 NOTE — Discharge Instructions (Signed)
Your pancreas blood test levels were abnormal.  This may be suggestive of an inflammation process like irritable bowel syndrome.  You will need further work-up with a specialist.  Your CT scan thankfully did not show any signs of any medical emergencies or surgical emergencies as a cause of your pain. ? ?If your pain is getting much worse, or you are having persistent vomiting and cannot keep down food or water, or began having pain with fevers, please come back to the ER.  These may be signs of a worsening condition including pancreatitis that needs immediate attention. ? ?Please try to avoid alcohol which can irritate the pancreas. ?

## 2021-06-25 NOTE — ED Triage Notes (Addendum)
Pt c/o abd pain, N/V/D x 1 month. Yesterday pt started experiencing a fever and dark black stools. TMax 100.2 ?

## 2021-06-25 NOTE — ED Notes (Signed)
Pt NAD, a/ox4. Pt verbalizes understanding of all DC and f/u instructions. All questions answered. Pt walks with steady gait to lobby at DC.  ? ?

## 2021-06-25 NOTE — ED Provider Notes (Signed)
?[facility] EMERGENCY DEPT ?Provider Note ? ? ?CSN: [id number] ?Arrival date & time: 06/25/21  Meagan Hunter ? ?  ? ?History ? ?Chief Complaint  ?Patient presents with  ? Abdominal Pain  ? ? ?Meagan Hunter is a 31 y.o. female presenting emergency department abdominal pain.  The patient reports she has been experiencing primarily epigastric and periumbilical abdominal pain for about a month, waxing and waning but nearly always present.  It is not necessarily worse after eating.  It has her doubled over in pain.  She is also had very loose and dark bowel movements frequently.  She does have a history of gastritis and gastroparesis and reports that she does take Zantac and pantoprazole, but also uses a lot of NSAIDs and ibuprofen for pain.  She denies history of abdominal surgery.  She denies personal family history of IBS, Crohn's, ulcerative colitis.  She reports she drinks about 5-6 alcoholic beverages per week, but not a daily drinker, denies history of pancreatitis. ? ?HPI ? ?  ? ?Home Medications ?Prior to Admission medications   ?Medication Sig Start Date End Date Taking? Authorizing Provider  ?dicyclomine (BENTYL) 20 MG tablet Take 1 tablet (20 mg total) by mouth 2 (two) times daily as needed for spasms. Abdominal cramping 06/25/21 07/25/21 Yes Natane Heward, Kermit Balo, MD  ?ondansetron (ZOFRAN-ODT) 4 MG disintegrating tablet Take 1 tablet (4 mg total) by mouth every 8 (eight) hours as needed for up to 15 doses for nausea or vomiting. 06/25/21  Yes Charlize Hathaway, Kermit Balo, MD  ?traMADol (ULTRAM) 50 MG tablet Take 1 tablet (50 mg total) by mouth every 8 (eight) hours as needed for up to 12 doses. 06/25/21  Yes Terald Sleeper, MD  ?Docusate Sodium (DSS) 100 MG CAPS Take by mouth. 11/07/20   [provider]  ?hydrocortisone (ANUSOL-HC) 2.5 % rectal cream Place rectally 2 (two) times daily. 12/27/20   Anyanwu, Jethro Bastos, MD  ?ibuprofen (ADVIL) 800 MG tablet Take 1 tablet (800 mg total) by mouth every 8 (eight) hours as  needed for moderate pain. 04/01/21   Long, Arlyss Repress, MD  ?oxyCODONE-acetaminophen (PERCOCET/ROXICET) 5-325 MG tablet Take 1 tablet by mouth every 6 (six) hours as needed for severe pain. 04/01/21   Long, Arlyss Repress, MD  ?polyethylene glycol (MIRALAX / GLYCOLAX) 17 g packet Take 17 g by mouth daily.    [provider]  ?senna-docusate (SENOKOT-S) 8.6-50 MG tablet Take 1 tablet by mouth at bedtime as needed for mild constipation. 04/01/21   Long, Arlyss Repress, MD  ?   ? ?Allergies    ?Penicillins and Sertraline   ? ?Review of Systems   ?Review of Systems ? ?Physical Exam ?Updated Vital Signs ?BP 108/70 (BP Location: Right Arm)   Pulse 68   Temp 98 ?F (36.7 ?C) (Oral)   Resp 16   Ht 5\' 4"  (1.626 m)   Wt 62.6 kg   LMP 05/30/2021   SpO2 99%   BMI 23.69 kg/m?  ?Physical Exam ?Constitutional:   ?   General: She is not in acute distress. ?HENT:  ?   Head: Normocephalic and atraumatic.  ?Eyes:  ?   Conjunctiva/sclera: Conjunctivae normal.  ?   Pupils: Pupils are equal, round, and reactive to light.  ?Cardiovascular:  ?   Rate and Rhythm: Normal rate and regular rhythm.  ?Pulmonary:  ?   Effort: Pulmonary effort is normal. No respiratory distress.  ?Abdominal:  ?   General: There is no distension.  ?   Tenderness: There  is abdominal tenderness in the epigastric area and periumbilical area. There is no guarding. Negative signs include Murphy's sign.  ?Skin: ?   General: Skin is warm and dry.  ?Neurological:  ?   General: No focal deficit present.  ?   Mental Status: She is alert. Mental status is at baseline.  ?Psychiatric:     ?   Mood and Affect: Mood normal.     ?   Behavior: Behavior normal.  ? ? ?ED Results / Procedures / Treatments   ?Labs ?(all labs ordered are listed, but only abnormal results are displayed) ?Labs Reviewed  ?LIPASE, BLOOD - Abnormal; Notable for the following components:  ?    Result Value  ? Lipase 109 (*)   ? All other components within normal limits  ?COMPREHENSIVE METABOLIC PANEL - Abnormal;  Notable for the following components:  ? AST 13 (*)   ? All other components within normal limits  ?URINALYSIS, ROUTINE W REFLEX MICROSCOPIC - Abnormal; Notable for the following components:  ? Specific Gravity, Urine 1.031 (*)   ? Protein, ur TRACE (*)   ? All other components within normal limits  ?CBC  ?PREGNANCY, URINE  ? ? ?EKG ?None ? ?Radiology ?CT ABDOMEN PELVIS W CONTRAST ? ?Result Date: 06/25/2021 ?CLINICAL DATA:  Acute pancreatitis.  Epigastric pain. EXAM: CT ABDOMEN AND PELVIS WITH CONTRAST TECHNIQUE: Multidetector CT imaging of the abdomen and pelvis was performed using the standard protocol following bolus administration of intravenous contrast. RADIATION DOSE REDUCTION: This exam was performed according to the departmental dose-optimization program which includes automated exposure control, adjustment of the mA and/or kV according to patient size and/or use of iterative reconstruction technique. CONTRAST:  83mL OMNIPAQUE IOHEXOL 300 MG/ML  SOLN COMPARISON:  Report only CT abdomen and pelvis 06/06/2015. FINDINGS: Lower chest: No acute abnormality. Hepatobiliary: No focal liver abnormality is seen. No gallstones, gallbladder wall thickening, or biliary dilatation. Pancreas: Unremarkable. No pancreatic ductal dilatation or surrounding inflammatory changes. Spleen: Normal in size without focal abnormality. Adrenals/Urinary Tract: Adrenal glands are unremarkable. Kidneys are normal, without renal calculi, focal lesion, or hydronephrosis. Bladder is unremarkable. Stomach/Bowel: Stomach is within normal limits. Appendix appears normal. No evidence of bowel wall thickening, distention, or inflammatory changes. Vascular/Lymphatic: No significant vascular findings are present. No enlarged abdominal or pelvic lymph nodes. Reproductive: Uterus is mildly prominent in size and heterogeneous which may be related to fibroid change. Adnexa are unremarkable. Other: No abdominal wall hernia or abnormality. No  abdominopelvic ascites. Musculoskeletal: No acute or significant osseous findings. IMPRESSION: 1. No acute localizing process in the abdomen or pelvis. 2. Heterogeneous mildly enlarged uterus may related to fibroid change. This can be followed up with pelvic ultrasound if clinically warranted. Electronically Signed   By: Darliss Cheney M.D.   On: 06/25/2021 21:15   ? ?Procedures ?Procedures  ? ? ?Medications Ordered in ED ?Medications  ?ondansetron (ZOFRAN-ODT) disintegrating tablet 4 mg (4 mg Oral Given 06/25/21 1913)  ?morphine (PF) 4 MG/ML injection 4 mg (4 mg Intravenous Given 06/25/21 2034)  ?iohexol (OMNIPAQUE) 300 MG/ML solution 75 mL (75 mLs Intravenous Contrast Given 06/25/21 2059)  ? ? ?ED Course/ Medical Decision Making/ A&P ?  ?                        ?Medical Decision Making ?Amount and/or Complexity of Data Reviewed ?Labs: ordered. ?Radiology: ordered. ? ?Risk ?Prescription drug management. ? ? ?This patient presents to the Emergency Department with complaint of abdominal  pain. This involves an extensive number of treatment options, and is a complaint that carries with it a high risk of complications and morbidity.  The differential diagnosis includes, but is not limited to, gastritis was pancreatitis versus biliary disease or peptic ulcer versus colitis versus other ? ?I ordered, reviewed, and interpreted labs, including CMP and CBC.  There were no immediate, life-threatening emergencies found in this labwork.  Lipase is elevated patient's. UA showed no signs of infection ?I ordered medication IV morphine, IV Zofran for abdominal pain and/or nausea ?I ordered imaging studies which included CT scan of the abdomen pelvis ?I independently visualized and interpreted imaging which showed no focal findings to explain he patient's symptoms and the monitor tracing which showed normal sinus rhythm ? ? ?After the interventions stated above, I reevaluated the patient and found that they remained clinically  stable. ? ?Based on the patient's clinical exam, vital signs, risk factors, and ED testing, I felt that the patient's overall risk of life-threatening emergency such as bowel perforation, surgical emergency, or sepsis was quite low.  I

## 2021-06-28 ENCOUNTER — Ambulatory Visit (INDEPENDENT_AMBULATORY_CARE_PROVIDER_SITE_OTHER): Payer: Medicaid Other | Admitting: Nurse Practitioner

## 2021-06-28 ENCOUNTER — Encounter: Payer: Self-pay | Admitting: Nurse Practitioner

## 2021-06-28 VITALS — HR 95 | Ht 64.0 in | Wt 139.1 lb

## 2021-06-28 DIAGNOSIS — R112 Nausea with vomiting, unspecified: Secondary | ICD-10-CM

## 2021-06-28 DIAGNOSIS — R1030 Lower abdominal pain, unspecified: Secondary | ICD-10-CM

## 2021-06-28 DIAGNOSIS — R1010 Upper abdominal pain, unspecified: Secondary | ICD-10-CM | POA: Diagnosis not present

## 2021-06-28 DIAGNOSIS — R197 Diarrhea, unspecified: Secondary | ICD-10-CM

## 2021-06-28 NOTE — Progress Notes (Signed)
Addendum: Reviewed and agree with assessment and management plan. Wm Fruchter M, MD  

## 2021-06-28 NOTE — Progress Notes (Signed)
? ? ?Assessment  ? ?Patient profile:  ?Meagan Hunter is a 31 y.o. female new to practice, referred by the ED.  Past medical history significant for asthma, kidney stones, lupus, PUD, GERD, hiatal hernia, gastroparesis,  anxiety See PMH below for any additional history ? ?Chronic nausea / vomiting with history of gastroparesis. Abnormal gastric emptying study in 2015.   ? ?Epigastric pain radiating through to her back and shoulders / lower abdominal cramping / and diarrhea. Cause of epigastric pain could be PUD in setting of NSAIDS but she has other symptoms not explained by an ulcer.  ?Lipase elevated but < 3 x ULN . No findings of pancreatitis on CT scan.  ?Unsure why the lower abdominal cramping and diarrhea. Currently diarrhea has resolved after starting Zofran and Bentyl. Previously followed by [facility] in 2015 then [facility] in 2017 for similar symptoms. Apparently had EGD and colonoscopy with random biopsies in 2017 ? ?GERD, asymptomatic on daily PPI plus pepcid ? ? ?Plan  ? ?Obtain EGD colonoscopy report from [facility] done in 2017  ?Will start with an EGD since most pressing issue is that of her upper abdominal pain in setting of NSAIDS.  The risks and benefits of EGD with possible biopsies were discussed with the patient who agrees to proceed. She will have the EGD tomorrow. I asked her not to take NSAIDs until we can see what EGD shows ?She is already on a PPI ?Unable to obtain stools studies as she hasn't had diarrhea for the last couple of days ( since starting bentyl, tramadol and zofran).  ?She can follow up with me after EGD. Call in interim if diarrhea persists as stools studies will need to be collected.  ? ?History of Present Illness  ? ?Chief complaint: upper abdominal pain, lower abdominal cramping, diarrhea, chronic nausea / vomiting, fatigue ? ?Meagan Hunter  has a remote history of nausea, vomiting, diarrhea, abdominal pain. She was followed by [facility] in 2015 and then [facility] in 2017. Had  EGD and colonoscopy with random biopsies in 2017. Reports not available but see path below. Diagnosed with IBS and gastroparesis. Did well for years until just recently.  ? ?Meagan Hunter was seen in ED on 06/25/21 for upper abdominal pain and periumbilical pain present for about a month.  CBC, CMET unremarkable, Lipase 109 ( less than 3 x ULN). She is not a heavy drinker. Preg test negative. CTAP w/ contrast unremarkable except for possible fibroid change of uterus. The epigastric pain radiates into her back and shoulders and is unrelated to eating. The pain occurs randomly and can wake her up at night. She has nausea and vomiting ( more of a chronic issue), headaches, and fatigue. She has been have loose, greasy black stool over the last month. She takes ibuprofen 800 mg every 8 hours for generalized pain related to lupus. Diarrhea stopped a couple of days ago which she thinks is due to taking Zofran, bentyl and tramadol ? ? ?Previous Labs / Imaging:: ? ?  Latest Ref Rng & Units 06/25/2021  ?  7:23 PM 12/14/2020  ?  5:12 AM 12/13/2020  ?  8:15 AM  ?CBC  ?WBC 4.0 - 10.5 K/uL 6.3   13.5   9.7    ?Hemoglobin 12.0 - 15.0 g/dL 22.0   25.4   27.0    ?Hematocrit 36.0 - 46.0 % 37.6   31.8   35.1    ?Platelets 150 - 400 K/uL 211   171   193    ? ? ?  Lab Results  ?Component Value Date  ? LIPASE 109 (H) 06/25/2021  ? ? ?  Latest Ref Rng & Units 06/25/2021  ?  7:23 PM 08/16/2020  ?  6:55 AM  ?CMP  ?Glucose 70 - 99 mg/dL 82   672    ?BUN 6 - 20 mg/dL 15   7    ?Creatinine 0.44 - 1.00 mg/dL 0.94   7.09    ?Sodium 135 - 145 mmol/L 139   139    ?Potassium 3.5 - 5.1 mmol/L 3.7   3.3    ?Chloride 98 - 111 mmol/L 106   103    ?CO2 22 - 32 mmol/L 24   23    ?Calcium 8.9 - 10.3 mg/dL 9.4   9.2    ?Total Protein 6.5 - 8.1 g/dL 7.1   7.0    ?Total Bilirubin 0.3 - 1.2 mg/dL 0.3   0.9    ?Alkaline Phos 38 - 126 U/L 47   46    ?AST 15 - 41 U/L 13   23    ?ALT 0 - 44 U/L 8   18    ? ? ? ?Previous GI Evaluations  ? ?Endoscopies: ?Per 10/20/13 [facility] note  she had an EGD ?revealed normal duodenal bulb, normal stomach, white specks in the distal esophagus.Esophageal Dilation was successful with a 54 French Maloney dilator. Biopsies were taken of the duodenum and esophagus are still pending ? ? ?August 2017 EGD and colonoscopy [facility] ?Reports not available. Path below ?Diagnosis    ?A. TISSUE LABELED "STOMACH", ENDOSCOPIC BIOPSY: ?             FRAGMENTS OF UNREMARKABLE SMALL INTESTINAL MUCOSA. ?             NO EVIDENCE OF CELIAC DISEASE. ?  ?B. TISSUE LABELED "DUODENUM", ENDOSCOPIC BIOPSY: ?             MILD CHRONIC GASTRITIS. ?             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 ?  ?C. LARGE INTESTINE, RANDOM, ENDOSCOPIC BIOPSY: ?             FRAGMENTS OF UNREMARKABLE COLONIC MUCOSA. ?             NO EVIDENCE OF COLITIS  ? ?Imaging:  ?Sept 2015 Gastric Emptying study ?FINDINGS: Gastric emptying at 4 hours is 60%. Normal is greater than or equal to 90%  ? ? ?CT ABDOMEN PELVIS W CONTRAST ?CLINICAL DATA:  Acute pancreatitis.  Epigastric pain. ? ?EXAM: ?CT ABDOMEN AND PELVIS WITH CONTRAST ? ?TECHNIQUE: ?Multidetector CT imaging of the abdomen and pelvis was performed ?using the standard protocol following bolus administration of ?intravenous contrast. ? ?RADIATION DOSE REDUCTION: This exam was performed according to the ?departmental dose-optimization program which includes automated ?exposure control, adjustment of the mA and/or kV according to ?patient size and/or use of iterative reconstruction technique. ? ?CONTRAST:  10mL OMNIPAQUE IOHEXOL 300 MG/ML  SOLN ? ?COMPARISON:  Report only CT abdomen and pelvis 06/06/2015. ? ?FINDINGS: ?Lower chest: No acute abnormality. ? ?Hepatobiliary: No focal liver abnormality is seen. No gallstones, ?gallbladder wall thickening, or biliary dilatation. ? ?Pancreas: Unremarkable. No pancreatic ductal dilatation or ?surrounding inflammatory changes. ? ?Spleen: Normal in size without focal abnormality. ? ?Adrenals/Urinary  Tract: Adrenal glands are unremarkable. Kidneys are ?normal, without renal calculi, focal lesion, or hydronephrosis. ?Bladder is unremarkable. ? ?Stomach/Bowel: Stomach is within normal limits. Appendix appears ?normal. No evidence of bowel wall thickening, distention,  or ?inflammatory changes. ? ?Vascular/Lymphatic: No significant vascular findings are present. No ?enlarged abdominal or pelvic lymph nodes. ? ?Reproductive: Uterus is mildly prominent in size and heterogeneous ?which may be related to fibroid change. Adnexa are unremarkable. ? ?Other: No abdominal wall hernia or abnormality. No abdominopelvic ?ascites. ? ?Musculoskeletal: No acute or significant osseous findings. ? ?IMPRESSION: ?1. No acute localizing process in the abdomen or pelvis. ?2. Heterogeneous mildly enlarged uterus may related to fibroid ?change. This can be followed up with pelvic ultrasound if clinically ?warranted. ? ?Electronically Signed ?  By: Darliss CheneyAmy  Guttmann M.D. ?  On: 06/25/2021 21:15 ? ? ? ?Past Medical History:  ?Diagnosis Date  ? Anemia   ? Anxiety   ? Asthma   ? Depression   ? Gastroparesis   ? GERD (gastroesophageal reflux disease)   ? Hiatal hernia   ? Hyperemesis affecting pregnancy, antepartum   ? Kidney stones   ? Lupus (HCC)   ? Peptic ulcer   ? ?Past Surgical History:  ?Procedure Laterality Date  ? CYST EXCISION    ? EYE  ? ?Family History  ?Problem Relation Age of Onset  ? Hypertension Mother   ? Ovarian cancer Mother   ? Thyroid cancer Mother   ? Crohn's disease Mother   ? Hypertension Father   ? Hyperlipidemia Father   ? Heart failure Father   ? Hashimoto's thyroiditis Sister   ? Colon cancer Paternal Grandfather   ? Breast cancer Maternal Aunt   ? ?Social History  ? ?Tobacco Use  ? Smoking status: Former  ?  Types: Cigarettes  ?  Quit date: 2017  ?  Years since quitting: 6.3  ? Smokeless tobacco: Never  ?Vaping Use  ? Vaping Use: Never used  ?Substance Use Topics  ? Alcohol use: Yes  ?  Comment: 3 per week  ? Drug use:  Never  ? ?Current Outpatient Medications  ?Medication Sig Dispense Refill  ? acetaminophen (TYLENOL) 500 MG tablet Take 500-1,000 mg by mouth 2 (two) times daily.    ? dicyclomine (BENTYL) 20 MG tabl

## 2021-06-28 NOTE — Patient Instructions (Signed)
You have been scheduled for an endoscopy. Please follow written instructions given to you at your visit today. ?If you use inhalers (even only as needed), please bring them with you on the day of your procedure.  ? ?We will try to obtain your records from [facility] Records ? ?Due to recent changes in healthcare laws, you may see the results of your imaging and laboratory studies on MyChart before your provider has had a chance to review them.  We understand that in some cases there may be results that are confusing or concerning to you. Not all laboratory results come back in the same time frame and the provider may be waiting for multiple results in order to interpret others.  Please give [place] 48 hours in order for your provider to thoroughly review all the results before contacting the office for clarification of your results.   ? ?If you are age 31 or older, your body mass index should be between 23-30. Your Body mass index is 23.88 kg/m?Marland Kitchen If this is out of the aforementioned range listed, please consider follow up with your Primary Care Provider. ? ?If you are age 2 or younger, your body mass index should be between 19-25. Your Body mass index is 23.88 kg/m?Marland Kitchen If this is out of the aformentioned range listed, please consider follow up with your Primary Care Provider.  ? ?________________________________________________________ ? ?The [facility] providers would like to encourage you to use [facility] to communicate with providers for non-urgent requests or questions.  Due to long hold times on the telephone, sending your provider a message by [facility] may be a faster and more efficient way to get a response.  Please allow 48 business hours for a response.  Please remember that this is for non-urgent requests.  ?_______________________________________________________  ? ?I appreciate the  opportunity to care for you ? ?Thank You  ? ?Midge Minium ?

## 2021-06-29 ENCOUNTER — Encounter: Payer: Self-pay | Admitting: Internal Medicine

## 2021-06-29 ENCOUNTER — Ambulatory Visit (AMBULATORY_SURGERY_CENTER): Payer: Medicaid Other | Admitting: Internal Medicine

## 2021-06-29 VITALS — BP 100/66 | HR 66 | Temp 98.6°F | Resp 14 | Ht 64.0 in | Wt 139.0 lb

## 2021-06-29 DIAGNOSIS — R112 Nausea with vomiting, unspecified: Secondary | ICD-10-CM

## 2021-06-29 DIAGNOSIS — K2970 Gastritis, unspecified, without bleeding: Secondary | ICD-10-CM

## 2021-06-29 DIAGNOSIS — K2950 Unspecified chronic gastritis without bleeding: Secondary | ICD-10-CM | POA: Diagnosis not present

## 2021-06-29 DIAGNOSIS — R1013 Epigastric pain: Secondary | ICD-10-CM

## 2021-06-29 MED ORDER — SODIUM CHLORIDE 0.9 % IV SOLN: 500.0000 mL | Freq: Once | INTRAVENOUS | Status: DC

## 2021-06-29 NOTE — Patient Instructions (Signed)
YOU HAD AN ENDOSCOPIC PROCEDURE TODAY AT THE [facility]:   Refer to the procedure report that was given to you for any specific questions about what was found during the examination.  If the procedure report does not answer your questions, please call your gastroenterologist to clarify.  If you requested that your care partner not be given the details of your procedure findings, then the procedure report has been included in a sealed envelope for you to review at your convenience later.  YOU SHOULD EXPECT: Some feelings of bloating in the abdomen. Passage of more gas than usual.  Walking can help get rid of the air that was put into your GI tract during the procedure and reduce the bloating. If you had a lower endoscopy (such as a colonoscopy or flexible sigmoidoscopy) you may notice spotting of blood in your stool or on the toilet paper. If you underwent a bowel prep for your procedure, you may not have a normal bowel movement for a few days.  Please Note:  You might notice some irritation and congestion in your nose or some drainage.  This is from the oxygen used during your procedure.  There is no need for concern and it should clear up in a day or so.  SYMPTOMS TO REPORT IMMEDIATELY:    Following upper endoscopy (EGD)  Vomiting of blood or coffee ground material  New chest pain or pain under the shoulder blades  Painful or persistently difficult swallowing  New shortness of breath  Fever of 100F or higher  Black, tarry-looking stools  For urgent or emergent issues, a gastroenterologist can be reached at any hour by calling [phone]. Do not use MyChart messaging for urgent concerns.    DIET:  We do recommend a small meal at first, but then you may proceed to your regular diet.  Drink plenty of fluids but you should avoid alcoholic beverages for 24 hours.  ACTIVITY:  You should plan to take it easy for the rest of today and you should NOT DRIVE or use heavy machinery  until tomorrow (because of the sedation medicines used during the test).    FOLLOW UP: Our staff will call the number listed on your records 48-72 hours following your procedure to check on you and address any questions or concerns that you may have regarding the information given to you following your procedure. If we do not reach you, we will leave a message.  We will attempt to reach you two times.  During this call, we will ask if you have developed any symptoms of COVID 19. If you develop any symptoms (ie: fever, flu-like symptoms, shortness of breath, cough etc.) before then, please call [phone].  If you test positive for Covid 19 in the 2 weeks post procedure, please call and report this information to us.    If any biopsies were taken you will be contacted by phone or by letter within the next 1-3 weeks.  Please call us at [phone] if you have not heard about the biopsies in 3 weeks.    SIGNATURES/CONFIDENTIALITY: You and/or your care partner have signed paperwork which will be entered into your electronic medical record.  These signatures attest to the fact that that the information above on your After Visit Summary has been reviewed and is understood.  Full responsibility of the confidentiality of this discharge information lies with you and/or your care-partner. 

## 2021-06-29 NOTE — Progress Notes (Signed)
See office note dated yesterday for details ?Patient presenting for upper endoscopy to evaluate nausea, vomiting and upper abdominal pain ?She remains appropriate for EGD in the [facility] today ?

## 2021-06-29 NOTE — Progress Notes (Signed)
VSS, transported to PACU °

## 2021-06-29 NOTE — Progress Notes (Signed)
Called to room to assist during endoscopic procedure.  Patient ID and intended procedure confirmed with present staff. Received instructions for my participation in the procedure from the performing physician.  

## 2021-06-29 NOTE — Op Note (Signed)
[facility] ?Patient Name: Meagan Hunter ?Procedure Date: 06/29/2021 3:24 PM ?MRN: [id number] ?Endoscopist: Beverley Fiedler , MD ?Age: 31 ?Referring MD:  ?Date of Birth: Feb 13, 1991 ?Gender: Female ?Account #: [id number] ?Procedure:                Upper GI endoscopy ?Indications:              Epigastric abdominal pain, Nausea with vomiting,  ?                          history of GERD controlled on omeprazole in AM and  ?                          famotidine in PM, history of gastroparesis ?Medicines:                Monitored Anesthesia Care ?Procedure:                Pre-Anesthesia Assessment: ?                          - Prior to the procedure, a History and Physical  ?                          was performed, and patient medications and  ?                          allergies were reviewed. The patient's tolerance of  ?                          previous anesthesia was also reviewed. The risks  ?                          and benefits of the procedure and the sedation  ?                          options and risks were discussed with the patient.  ?                          All questions were answered, and informed consent  ?                          was obtained. Prior Anticoagulants: The patient has  ?                          taken no previous anticoagulant or antiplatelet  ?                          agents. ASA Grade Assessment: II - A patient with  ?                          mild systemic disease. After reviewing the risks  ?                          and benefits, the patient was deemed in  ?  satisfactory condition to undergo the procedure. ?                          After obtaining informed consent, the endoscope was  ?                          passed under direct vision. Throughout the  ?                          procedure, the patient's blood pressure, pulse, and  ?                          oxygen saturations were monitored continuously. The  ?                          Endoscope was  introduced through the mouth, and  ?                          advanced to the second part of duodenum. The upper  ?                          GI endoscopy was accomplished without difficulty.  ?                          The patient tolerated the procedure well. ?Scope In: ?Scope Out: ?Findings:                 In the proximal esophagus there was a small inlet  ?                          patch without nodularity or visible abnormality. ?                          The examined esophagus was normal. ?                          The entire examined stomach was normal. Biopsies  ?                          were taken with a cold forceps for histology and  ?                          Helicobacter pylori testing. ?                          The examined duodenum was normal. Biopsies were  ?                          taken with a cold forceps for histology. ?Complications:            No immediate complications. ?Estimated Blood Loss:     Estimated blood loss was minimal. ?Impression:               - Normal esophagus. ?                          -  Normal stomach. Biopsied. ?                          - Normal examined duodenum. Biopsied. ?Recommendation:           - Patient has a contact number available for  ?                          emergencies. The signs and symptoms of potential  ?                          delayed complications were discussed with the  ?                          patient. Return to normal activities tomorrow.  ?                          Written discharge instructions were provided to the  ?                          patient. ?                          - Resume previous diet. ?                          - Continue present medications. ?                          - Await pathology results. ?Beverley Fiedler, MD ?06/29/2021 3:46:23 PM ?This report has been signed electronically. ?

## 2021-07-03 ENCOUNTER — Telehealth: Payer: Self-pay

## 2021-07-03 NOTE — Telephone Encounter (Signed)
Left message on answering machine. 

## 2021-07-04 ENCOUNTER — Encounter: Payer: Self-pay | Admitting: Internal Medicine

## 2021-07-09 ENCOUNTER — Other Ambulatory Visit: Payer: Self-pay

## 2021-07-09 ENCOUNTER — Encounter: Payer: Self-pay | Admitting: Internal Medicine

## 2021-07-09 ENCOUNTER — Telehealth: Payer: Self-pay | Admitting: Internal Medicine

## 2021-07-09 MED ORDER — ONDANSETRON 4 MG PO TBDP: 4.0000 mg | ORAL_TABLET | Freq: Three times a day (TID) | ORAL | 0 refills | Status: DC | PRN

## 2021-07-09 NOTE — Telephone Encounter (Signed)
Pt states she continues to have abd pain/nausea and vomiting. Reports the pain goes around her back to her shoulder blade at times. Pt scheduled to see Meagan Cluster NP 07/12/21 at 1:30pm, pt aware of appt. ?

## 2021-07-09 NOTE — Telephone Encounter (Signed)
Patient called to obtain her EGD results stated she is not getting any better has been vomiting all morning. ?

## 2021-07-12 ENCOUNTER — Ambulatory Visit: Payer: Medicaid Other | Admitting: Nurse Practitioner

## 2021-07-12 NOTE — Progress Notes (Deleted)
Assessment   Patient Profile:  Meagan Hunter is a 31 y.o. female known to Dr. Hilarie Fredrickson with a past medical history of .asthma, kidney stones, lupus, PUD, GERD, hiatal hernia, gastroparesis,  anxiety   Additional medical history as listed in [place] .    Plan      HPI   Chief Complaint : [place] establish care here on 06/28/2021.  At that time she presented with multiple GI issues including chronic nausea and vomiting with history of gastroparesis, abdominal pain, diarrhea.  She had been to the ED, labs were unremarkable except for a lipase of 109.  CT scan unremarkable except for possible uterine fibroid.  She was scheduled for an EGD, which was normal. Of note, she was followed by GI with [facility] in 2015 then [place] in 2017 for similar symptoms. Apparently had EGD and colonoscopy with random biopsies in 2017 were unremarkable.     Previous GI Evaluation   Diagnosis 1. Surgical [P], small bowel biopsies - DUODENAL MUCOSA WITH NORMAL VILLOUS ARCHITECTURE. - NO VILLOUS ATROPHY OR INCREASED INTRAEPITHELIAL LYMPHOCYTES. 2. Surgical [P], gastric biopsies - ANTRAL AND OXYNTIC MUCOSA WITH SLIGHT CHRONIC INFLAMMATION. - NO HELICOBACTER PYLORI IDENTIFIED.   Imaging   Sept 2015 Gastric Emptying study FINDINGS: Gastric emptying at 4 hours is 60%. Normal is greater than or equal to 90%   06/25/21 CTAP w/ contrast  IMPRESSION: 1. No acute localizing process in the abdomen or pelvis. 2. Heterogeneous mildly enlarged uterus may related to fibroid change. This can be followed up with pelvic ultrasound if clinically warranted  Labs:     Latest Ref Rng & Units 06/25/2021    7:23 PM 12/14/2020    5:12 AM 12/13/2020    8:15 AM  CBC  WBC 4.0 - 10.5 K/uL 6.3   13.5   9.7    Hemoglobin 12.0 - 15.0 g/dL 12.8   10.9   11.9    Hematocrit 36.0 - 46.0 % 37.6   31.8   35.1    Platelets 150 - 400 K/uL 211   171   193         Latest Ref Rng & Units 06/25/2021    7:23 PM 08/16/2020    6:55  AM  Hepatic Function  Total Protein 6.5 - 8.1 g/dL 7.1   7.0    Albumin 3.5 - 5.0 g/dL 4.4   3.5    AST 15 - 41 U/L 13   23    ALT 0 - 44 U/L 8   18    Alk Phosphatase 38 - 126 U/L 47   46    Total Bilirubin 0.3 - 1.2 mg/dL 0.3   0.9       Past Medical History:  Diagnosis Date   Anemia    Anxiety    Asthma    Depression    Gastroparesis    GERD (gastroesophageal reflux disease)    Hiatal hernia    Hyperemesis affecting pregnancy, antepartum    Kidney stones    Lupus (HCC)    Peptic ulcer     Past Surgical History:  Procedure Laterality Date   CYST EXCISION     EYE   ESOPHAGOGASTRODUODENOSCOPY      Current Medications, Allergies, Family History and Social History were reviewed in [employer] record.     Current Outpatient Medications  Medication Sig Dispense Refill   acetaminophen (TYLENOL) 500 MG tablet Take 500-1,000 mg by mouth 2 (two) times daily.  dicyclomine (BENTYL) 20 MG tablet Take 1 tablet (20 mg total) by mouth 2 (two) times daily as needed for spasms. Abdominal cramping 30 tablet 0   ibuprofen (ADVIL) 800 MG tablet Take 1 tablet (800 mg total) by mouth every 8 (eight) hours as needed for moderate pain. 21 tablet 0   omeprazole (PRILOSEC) 10 MG capsule Take 10 mg by mouth daily.     ondansetron (ZOFRAN-ODT) 4 MG disintegrating tablet Take 1 tablet (4 mg total) by mouth every 8 (eight) hours as needed for up to 15 doses for nausea or vomiting. 15 tablet 0   promethazine (PHENERGAN) 25 MG tablet Take 1 tablet by mouth at bedtime.     ranitidine (ZANTAC) 150 MG tablet Take 150 mg by mouth daily.     traMADol (ULTRAM) 50 MG tablet Take 1 tablet (50 mg total) by mouth every 8 (eight) hours as needed for up to 12 doses. 12 tablet 0   VENTOLIN HFA 108 (90 Base) MCG/ACT inhaler Inhale 1 puff into the lungs as needed.     No current facility-administered medications for this visit.    Review of Systems: No chest pain. No shortness of  breath. No urinary complaints.    Physical Exam  Wt Readings from Last 3 Encounters:  06/29/21 139 lb (63 kg)  06/28/21 139 lb 2 oz (63.1 kg)  06/25/21 138 lb (62.6 kg)    LMP 06/28/2021 Comment: neg preg test 06/26/21 in ER Constitutional:  Generally well appearing ***female in no acute distress. Psychiatric: Pleasant. Normal mood and affect. Behavior is normal. EENT: Pupils normal.  Conjunctivae are normal. No scleral icterus. Neck supple.  Cardiovascular: Normal rate, regular rhythm. No edema Pulmonary/chest: Effort normal and breath sounds normal. No wheezing, rales or rhonchi. Abdominal: Soft, nondistended, nontender. Bowel sounds active throughout. There are no masses palpable. No hepatomegaly. Neurological: Alert and oriented to person place and time. Skin: Skin is warm and dry. No rashes noted.  [name], NP  07/12/2021, 1:36 PM  Cc:  No ref. provider found

## 2021-10-19 ENCOUNTER — Telehealth: Payer: Self-pay | Admitting: *Deleted

## 2021-10-19 NOTE — Telephone Encounter (Signed)
Returned call from 11:52 AM, phones forward at 11:30 AM for close of business at 12:00 PM. Left patient a message to call and schedule.

## 2022-01-14 ENCOUNTER — Encounter: Payer: Self-pay | Admitting: Family

## 2022-01-14 ENCOUNTER — Ambulatory Visit (INDEPENDENT_AMBULATORY_CARE_PROVIDER_SITE_OTHER): Payer: Medicaid Other | Admitting: Family

## 2022-01-14 VITALS — BP 125/77 | HR 84 | Temp 98.0°F | Ht 64.0 in | Wt 139.2 lb

## 2022-01-14 DIAGNOSIS — N923 Ovulation bleeding: Secondary | ICD-10-CM

## 2022-01-14 DIAGNOSIS — F902 Attention-deficit hyperactivity disorder, combined type: Secondary | ICD-10-CM | POA: Insufficient documentation

## 2022-01-14 DIAGNOSIS — R110 Nausea: Secondary | ICD-10-CM | POA: Diagnosis not present

## 2022-01-14 DIAGNOSIS — R1030 Lower abdominal pain, unspecified: Secondary | ICD-10-CM | POA: Diagnosis not present

## 2022-01-14 LAB — POCT URINE PREGNANCY: Preg Test, Ur: NEGATIVE

## 2022-01-14 MED ORDER — ONDANSETRON HCL 4 MG PO TABS: 4.0000 mg | ORAL_TABLET | Freq: Three times a day (TID) | ORAL | 0 refills | Status: DC | PRN

## 2022-01-14 MED ORDER — LISDEXAMFETAMINE DIMESYLATE 20 MG PO CAPS: 20.0000 mg | ORAL_CAPSULE | Freq: Every day | ORAL | 0 refills | Status: DC

## 2022-01-14 NOTE — Patient Instructions (Signed)
Welcome to [facility], It was a pleasure meeting you today!   I will review your lab results via MyChart in a day or 2.  As discussed, I have sent your Vyvanse to your pharmacy.  I have sent a referral to our Gastroenterology office  Please schedule a 1 month follow up visit today.    PLEASE NOTE: If you had any LAB tests please let us know if you have not heard back within a few days. You may see your results on MyChart before we have a chance to review them but we will give you a call once they are reviewed by [place]. If we ordered any REFERRALS today, please let us know if you have not heard from their office within the next week.  Let us know through MyChart if you are needing REFILLS, or have your pharmacy send [place] the request. You can also use MyChart to communicate with me or any office staff.
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
## 2022-01-14 NOTE — Assessment & Plan Note (Signed)
chronic - unstable - last tx in 2018 [place] with adderall and strattera, liked Ritalin better discussed options starting Vyvanse 20mg  qd, advised on use & [place] PDMP checked & verified f/u 1 month

## 2022-01-15 ENCOUNTER — Encounter: Payer: Self-pay | Admitting: Family

## 2022-01-15 DIAGNOSIS — F902 Attention-deficit hyperactivity disorder, combined type: Secondary | ICD-10-CM

## 2022-01-15 DIAGNOSIS — R110 Nausea: Secondary | ICD-10-CM

## 2022-01-15 LAB — HCG, SERUM, QUALITATIVE: Preg, Serum: NEGATIVE

## 2022-01-15 MED ORDER — ONDANSETRON HCL 4 MG PO TABS: 4.0000 mg | ORAL_TABLET | Freq: Three times a day (TID) | ORAL | 0 refills | Status: DC | PRN

## 2022-01-15 MED ORDER — LISDEXAMFETAMINE DIMESYLATE 20 MG PO CAPS: 20.0000 mg | ORAL_CAPSULE | Freq: Every day | ORAL | 0 refills | Status: DC

## 2022-01-15 NOTE — Progress Notes (Signed)
Both pregnancy tests are negative.  I have sent your prescriptions to [facility] on [address] - there is no store on [address]

## 2022-01-15 NOTE — Telephone Encounter (Signed)
RX sent to [facility] on [address] - [address]

## 2022-02-01 ENCOUNTER — Encounter: Payer: Self-pay | Admitting: Family

## 2022-02-01 ENCOUNTER — Ambulatory Visit (INDEPENDENT_AMBULATORY_CARE_PROVIDER_SITE_OTHER): Payer: Medicaid Other | Admitting: Family

## 2022-02-01 VITALS — BP 125/76 | HR 94 | Temp 97.7°F | Ht 64.0 in | Wt 137.0 lb

## 2022-02-01 DIAGNOSIS — F902 Attention-deficit hyperactivity disorder, combined type: Secondary | ICD-10-CM | POA: Diagnosis not present

## 2022-02-01 MED ORDER — AMPHETAMINE-DEXTROAMPHET ER 15 MG PO CP24: 15.0000 mg | ORAL_CAPSULE | ORAL | 0 refills | Status: DC

## 2022-02-01 NOTE — Assessment & Plan Note (Signed)
chronic - unstable - last tx in 2018 reports Vyvanse 20mg  made her feel numb, still did not focus, does not want to increase dose switching back to Adderall XR, will start at 15mg , reminded pt on use & SE f/u 3 month
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
## 2022-02-11 ENCOUNTER — Ambulatory Visit: Payer: Medicaid Other | Admitting: Family
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
## 2022-02-14 ENCOUNTER — Encounter: Payer: Self-pay | Admitting: Physician Assistant

## 2022-02-14 ENCOUNTER — Ambulatory Visit (INDEPENDENT_AMBULATORY_CARE_PROVIDER_SITE_OTHER)
Admission: RE | Admit: 2022-02-14 | Discharge: 2022-02-14 | Disposition: A | Discharge status: Home / Self Care | Payer: Medicaid Other | Source: Ambulatory Visit | Attending: Physician Assistant | Admitting: Physician Assistant

## 2022-02-14 ENCOUNTER — Other Ambulatory Visit (INDEPENDENT_AMBULATORY_CARE_PROVIDER_SITE_OTHER): Payer: Medicaid Other

## 2022-02-14 ENCOUNTER — Ambulatory Visit (INDEPENDENT_AMBULATORY_CARE_PROVIDER_SITE_OTHER): Payer: Medicaid Other | Admitting: Physician Assistant

## 2022-02-14 VITALS — BP 108/62 | HR 127 | Ht 64.0 in | Wt 134.4 lb

## 2022-02-14 DIAGNOSIS — R1114 Bilious vomiting: Secondary | ICD-10-CM

## 2022-02-14 DIAGNOSIS — K3184 Gastroparesis: Secondary | ICD-10-CM | POA: Diagnosis not present

## 2022-02-14 DIAGNOSIS — R1030 Lower abdominal pain, unspecified: Secondary | ICD-10-CM

## 2022-02-14 DIAGNOSIS — K760 Fatty (change of) liver, not elsewhere classified: Secondary | ICD-10-CM

## 2022-02-14 DIAGNOSIS — L299 Pruritus, unspecified: Secondary | ICD-10-CM | POA: Diagnosis not present

## 2022-02-14 DIAGNOSIS — K582 Mixed irritable bowel syndrome: Secondary | ICD-10-CM

## 2022-02-14 LAB — CBC WITH DIFFERENTIAL/PLATELET
Basophils Absolute: 0 10*3/uL (ref 0.0–0.1)
Basophils Relative: 0.5 % (ref 0.0–3.0)
Eosinophils Absolute: 0.1 10*3/uL (ref 0.0–0.7)
Eosinophils Relative: 1.2 % (ref 0.0–5.0)
HCT: 39.6 % (ref 36.0–46.0)
Hemoglobin: 13.7 g/dL (ref 12.0–15.0)
Lymphocytes Relative: 27.3 % (ref 12.0–46.0)
Lymphs Abs: 2.1 10*3/uL (ref 0.7–4.0)
MCHC: 34.6 g/dL (ref 30.0–36.0)
MCV: 91.8 fl (ref 78.0–100.0)
Monocytes Absolute: 0.3 10*3/uL (ref 0.1–1.0)
Monocytes Relative: 4.4 % (ref 3.0–12.0)
Neutro Abs: 5.2 10*3/uL (ref 1.4–7.7)
Neutrophils Relative %: 66.6 % (ref 43.0–77.0)
Platelets: 211 10*3/uL (ref 150.0–400.0)
RBC: 4.32 Mil/uL (ref 3.87–5.11)
RDW: 13 % (ref 11.5–15.5)
WBC: 7.8 10*3/uL (ref 4.0–10.5)

## 2022-02-14 LAB — HIGH SENSITIVITY CRP: CRP, High Sensitivity: 0.31 mg/L (ref 0.000–5.000)

## 2022-02-14 LAB — TSH: TSH: 1.34 u[IU]/mL (ref 0.35–5.50)

## 2022-02-14 LAB — COMPREHENSIVE METABOLIC PANEL
ALT: 9 U/L (ref 0–35)
AST: 13 U/L (ref 0–37)
Albumin: 4.7 g/dL (ref 3.5–5.2)
Alkaline Phosphatase: 34 U/L — ABNORMAL LOW (ref 39–117)
BUN: 8 mg/dL (ref 6–23)
CO2: 26 mEq/L (ref 19–32)
Calcium: 9.5 mg/dL (ref 8.4–10.5)
Chloride: 106 mEq/L (ref 96–112)
Creatinine, Ser: 0.79 mg/dL (ref 0.40–1.20)
GFR: 99.33 mL/min (ref >60.00)
Glucose, Bld: 86 mg/dL (ref 70–99)
Potassium: 4.6 mEq/L (ref 3.5–5.1)
Sodium: 139 mEq/L (ref 135–145)
Total Bilirubin: 0.4 mg/dL (ref 0.2–1.2)
Total Protein: 7.2 g/dL (ref 6.0–8.3)

## 2022-02-14 LAB — SEDIMENTATION RATE: Sed Rate: 6 mm/hr (ref 0–20)

## 2022-02-14 MED ORDER — HYOSCYAMINE SULFATE 0.125 MG SL SUBL: 0.1250 mg | SUBLINGUAL_TABLET | Freq: Four times a day (QID) | SUBLINGUAL | 1 refills | Status: DC | PRN

## 2022-02-14 NOTE — Progress Notes (Signed)
Addendum: Reviewed and agree with assessment and management plan. Caitlin Hillmer M, MD  

## 2022-02-14 NOTE — Patient Instructions (Addendum)
Your provider has requested that you go to the basement level for lab work before leaving today. Press "B" on the elevator. The lab is located at the first door on the left as you exit the elevator.  Your provider has requested that you have an abdominal x ray before leaving today. Please go to the basement floor to our Radiology department for the test.  You have been scheduled for an abdominal ultrasound at [facility] Radiology (1st floor of hospital) on 02/26/22 at 830 am. Please arrive 30 minutes prior to your appointment for registration. Make certain not to have anything to eat or drink 8 hours prior to your appointment. Should you need to reschedule your appointment, please contact radiology at [phone]. This test typically takes about 30 minutes to perform.   We have sent the following medications to your pharmacy for you to pick up at your convenience: Levsin  You have been scheduled for an appointment with Quentin Mulling on 04/17/22 at 200 pm . Please arrive 10 minutes early for your appointment.    PLEASE COME IN FOR LAB ONLY DAY ONLY OR DAY AFTER NEXT ATTACK.   First do a trial off milk/lactose products if you use them.  Add fiber like benefiber or citracel once a day Increase activity Can do trial of IBGard which is over the counter for AB pain- Take 1-2 capsules once a day for maintence or twice a day during a flare Can send in an anti spasm medication, Levsin, to take as needed  Please try low FODMAP diet- see below- start with eliminating just one column at a time, the table at the very bottom contains foods that are safe to take   FODMAP stands for fermentable oligo-, di-, mono-saccharides and polyols (1). These are the scientific terms used to classify groups of carbs that are notorious for triggering digestive symptoms like bloating, gas and stomach pain.     Gastroparesis Please do small frequent meals like 4-6 meals a day.  Eat and drink liquids at separate times.   Avoid high fiber foods, cook your vegetables, avoid high fat food.  Suggest spreading protein throughout the day (greek yogurt, glucerna, soft meat, milk, eggs) Choose soft foods that you can mash with a fork When you are more symptomatic, change to pureed foods foods and liquids.  Consider reading "Living well with Gastroparesis" by Reuel Derby Gastroparesis is a condition in which food takes longer than normal to empty from the stomach. This condition is also known as delayed gastric emptying. It is usually a long-term (chronic) condition. There is no cure, but there are treatments and things that you can do at home to help relieve symptoms. Treating the underlying condition that causes gastroparesis can also help relieve symptoms What are the causes? In many cases, the cause of this condition is not known. Possible causes include: A hormone (endocrine) disorder, such as hypothyroidism or diabetes. A nervous system disease, such as Parkinson's disease or multiple sclerosis. Cancer, infection, or surgery that affects the stomach or vagus nerve. The vagus nerve runs from your chest, through your neck, and to the lower part of your brain. A connective tissue disorder, such as scleroderma. Certain medicines. What increases the risk? You are more likely to develop this condition if: You have certain disorders or diseases. These may include: An endocrine disorder. An eating disorder. Amyloidosis. Scleroderma. Parkinson's disease. Multiple sclerosis. Cancer or infection of the stomach or the vagus nerve. You have had surgery on your stomach  or vagus nerve. You take certain medicines. You are female. What are the signs or symptoms? Symptoms of this condition include: Feeling full after eating very little or a loss of appetite. Nausea, vomiting, or heartburn. Bloating of your abdomen. Inconsistent blood sugar (glucose) levels on blood tests. Unexplained weight loss. Acid from the  stomach coming up into the esophagus (gastroesophageal reflux). Sudden tightening (spasm) of the stomach, which can be painful. Symptoms may come and go. Some people may not notice any symptoms. How is this diagnosed? This condition is diagnosed with tests, such as: Tests that check how long it takes food to move through the stomach and intestines. These tests include: Upper gastrointestinal (GI) series. For this test, you drink a liquid that shows up well on X-rays, and then X-rays are taken of your intestines. Gastric emptying scintigraphy. For this test, you eat food that contains a small amount of radioactive material, and then scans are taken. Wireless capsule GI monitoring system. For this test, you swallow a pill (capsule) that records information about how foods and fluid move through your stomach. Gastric manometry. For this test, a tube is passed down your throat and into your stomach to measure electrical and muscular activity. Endoscopy. For this test, a long, thin tube with a camera and light on the end is passed down your throat and into your stomach to check for problems in your stomach lining. Ultrasound. This test uses sound waves to create images of the inside of your body. This can help rule out gallbladder disease or pancreatitis as a cause of your symptoms. How is this treated? There is no cure for this condition, but treatment and home care may relieve symptoms. Treatment may include: Treating the underlying cause. Managing your symptoms by making changes to your diet and exercise habits. Taking medicines to control nausea and vomiting and to stimulate stomach muscles. Getting food through a feeding tube in the hospital. This may be done in severe cases. Having surgery to insert a device called a gastric electrical stimulator into your body. This device helps improve stomach emptying and control nausea and vomiting. Follow these instructions at home: Take over-the-counter  and prescription medicines only as told by your health care provider. Follow instructions from your health care provider about eating or drinking restrictions. Your health care provider may recommend that you: Eat smaller meals more often. Eat low-fat foods. Eat low-fiber forms of high-fiber foods. For example, eat cooked vegetables instead of raw vegetables. Have only liquid foods instead of solid foods. Liquid foods are easier to digest. Drink enough fluid to keep your urine pale yellow. Exercise as often as told by your health care provider. Keep all follow-up visits. This is important. Contact a health care provider if you: Notice that your symptoms do not improve with treatment. Have new symptoms. Get help right away if you: Have severe pain in your abdomen that does not improve with treatment. Have nausea that is severe or does not go away. Vomit every time you drink fluids. Summary Gastroparesis is a long-term (chronic) condition in which food takes longer than normal to empty from the stomach. Symptoms include nausea, vomiting, heartburn, bloating of your abdomen, and loss of appetite. Eating smaller portions, low-fat foods, and low-fiber forms of high-fiber foods may help you manage your symptoms. Get help right away if you have severe pain in your abdomen. This information is not intended to replace advice given to you by your health care provider. Make sure you discuss any  questions you have with your health care provider. Document Revised: 06/21/2019 Document Reviewed: 06/21/2019 Elsevier Patient Education  2021 [employer].

## 2022-02-19 ENCOUNTER — Encounter: Payer: Self-pay | Admitting: Family

## 2022-02-19 DIAGNOSIS — R110 Nausea: Secondary | ICD-10-CM

## 2022-02-19 DIAGNOSIS — K3184 Gastroparesis: Secondary | ICD-10-CM

## 2022-02-19 LAB — MITOCHONDRIAL ANTIBODIES: Mitochondrial M2 Ab, IgG: <=20 U (ref <=20.0)

## 2022-02-19 LAB — C3 AND C4
C3 Complement: 109 mg/dL (ref 83–193)
C4 Complement: 21 mg/dL (ref 15–57)

## 2022-02-19 LAB — C1 ESTERASE INHIBITOR, FUNCTIONAL: C1 Esterase Inhibitor Funct: >100 % (ref >=68)

## 2022-02-19 LAB — IGA: Immunoglobulin A: 146 mg/dL (ref 47–310)

## 2022-02-19 LAB — TISSUE TRANSGLUTAMINASE, IGA: (tTG) Ab, IgA: <1 U/mL

## 2022-02-19 MED ORDER — ONDANSETRON 8 MG PO TBDP: 8.0000 mg | ORAL_TABLET | Freq: Three times a day (TID) | ORAL | 0 refills | Status: DC | PRN

## 2022-02-19 NOTE — Addendum Note (Signed)
Addended by: Quentin Mulling on: 02/19/2022 12:38 PM   Modules accepted: Orders

## 2022-02-26 ENCOUNTER — Ambulatory Visit (HOSPITAL_COMMUNITY)
Admission: RE | Admit: 2022-02-26 | Discharge: 2022-02-26 | Disposition: A | Discharge status: Home / Self Care | Payer: Medicaid Other | Source: Ambulatory Visit | Attending: Physician Assistant | Admitting: Physician Assistant

## 2022-02-26 DIAGNOSIS — R1114 Bilious vomiting: Secondary | ICD-10-CM | POA: Diagnosis present

## 2022-02-26 DIAGNOSIS — R1030 Lower abdominal pain, unspecified: Secondary | ICD-10-CM | POA: Diagnosis present

## 2022-02-26 DIAGNOSIS — L299 Pruritus, unspecified: Secondary | ICD-10-CM

## 2022-02-27 ENCOUNTER — Encounter: Payer: Medicaid Other | Admitting: Family

## 2022-02-27 NOTE — Addendum Note (Signed)
Addended by: Vicie Mutters on: 02/27/2022 10:02 AM   Modules accepted: Orders

## 2022-02-28 ENCOUNTER — Other Ambulatory Visit (INDEPENDENT_AMBULATORY_CARE_PROVIDER_SITE_OTHER): Payer: Medicaid Other

## 2022-02-28 ENCOUNTER — Encounter: Payer: Self-pay | Admitting: Family

## 2022-02-28 ENCOUNTER — Ambulatory Visit (INDEPENDENT_AMBULATORY_CARE_PROVIDER_SITE_OTHER): Payer: Medicaid Other | Admitting: Family

## 2022-02-28 VITALS — BP 115/78 | HR 83 | Temp 98.0°F | Ht 64.0 in | Wt 131.2 lb

## 2022-02-28 DIAGNOSIS — F3281 Premenstrual dysphoric disorder: Secondary | ICD-10-CM | POA: Insufficient documentation

## 2022-02-28 DIAGNOSIS — F411 Generalized anxiety disorder: Secondary | ICD-10-CM | POA: Diagnosis not present

## 2022-02-28 DIAGNOSIS — K760 Fatty (change of) liver, not elsewhere classified: Secondary | ICD-10-CM | POA: Diagnosis not present

## 2022-02-28 DIAGNOSIS — L299 Pruritus, unspecified: Secondary | ICD-10-CM | POA: Diagnosis not present

## 2022-02-28 DIAGNOSIS — F902 Attention-deficit hyperactivity disorder, combined type: Secondary | ICD-10-CM | POA: Diagnosis not present

## 2022-02-28 DIAGNOSIS — Z0001 Encounter for general adult medical examination with abnormal findings: Secondary | ICD-10-CM | POA: Diagnosis not present

## 2022-02-28 HISTORY — DX: Premenstrual dysphoric disorder: F32.81

## 2022-02-28 LAB — IBC + FERRITIN
Ferritin: 23.8 ng/mL (ref 10.0–291.0)
Iron: 66 ug/dL (ref 42–145)
Saturation Ratios: 18.6 % — ABNORMAL LOW (ref 20.0–50.0)
TIBC: 355.6 ug/dL (ref 250.0–450.0)
Transferrin: 254 mg/dL (ref 212.0–360.0)

## 2022-02-28 LAB — LIPID PANEL
Cholesterol: 132 mg/dL (ref 0–200)
HDL: 55.6 mg/dL (ref >39.00)
LDL Cholesterol: 60 mg/dL (ref 0–99)
NonHDL: 76.02
Total CHOL/HDL Ratio: 2
Triglycerides: 81 mg/dL (ref 0.0–149.0)
VLDL: 16.2 mg/dL (ref 0.0–40.0)

## 2022-02-28 MED ORDER — VENLAFAXINE HCL ER 37.5 MG PO CP24: 37.5000 mg | ORAL_CAPSULE | Freq: Every day | ORAL | 2 refills | Status: DC

## 2022-02-28 MED ORDER — AMPHETAMINE-DEXTROAMPHET ER 20 MG PO CP24: 20.0000 mg | ORAL_CAPSULE | ORAL | 0 refills | Status: DC

## 2022-02-28 MED ORDER — CLONAZEPAM 0.5 MG PO TABS: 0.5000 mg | ORAL_TABLET | Freq: Every day | ORAL | 0 refills | Status: DC | PRN
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
## 2022-02-28 NOTE — Patient Instructions (Addendum)
It was very nice to see you today!   I will review your lab results via MyChart in a few days.  I have sent your med refills to your pharmacy.  Schedule a 1 month follow up visit to discuss your new medication.       PLEASE NOTE:  If you had any lab tests please let us know if you have not heard back within a few days. You may see your results on MyChart before we have a chance to review them but we will give you a call once they are reviewed by [place]. If we ordered any referrals today, please let us know if you have not heard from their office within the next week.

## 2022-03-02 NOTE — Assessment & Plan Note (Addendum)
chronic hx of zoloft (caused seizures), klonopin, effexor -many other meds that she says did not help her sx sending Effexor 37.5mg  ER qd, klonopin prn until Effexor effective;  advised on use & SE pt advised on getting in with [facility] for proper dx & therapy referring to [facility] f/u 1 mo

## 2022-03-02 NOTE — Assessment & Plan Note (Signed)
chronic - unstable - last tx in 2018 reports Vyvanse 20mg  made her feel numb, still did not focus switched  back to Adderall XR 15mg  denies SE, helped with focus some, but would like to increase dose to 20mg , refill sent f/u 1 month

## 2022-03-02 NOTE — Assessment & Plan Note (Signed)
chronic  pt has been tracking her cycles, sx are worse during her luteal phase starting Effexor, advised pt at lowest dose ok to take for 1-2 weeks, but does work better if taken qd, and esp at higher doses. 37.5mg  ER qd - sent to pharmacy f/u 1 month

## 2022-03-04 ENCOUNTER — Other Ambulatory Visit: Payer: Self-pay | Admitting: Family

## 2022-03-04 DIAGNOSIS — K3184 Gastroparesis: Secondary | ICD-10-CM

## 2022-03-05 ENCOUNTER — Other Ambulatory Visit: Payer: Self-pay | Admitting: Family

## 2022-03-05 DIAGNOSIS — K3184 Gastroparesis: Secondary | ICD-10-CM

## 2022-03-05 NOTE — Progress Notes (Signed)
Your cholesterol panel all looks good. Keep up the good work controlling your diet.

## 2022-03-06 MED ORDER — ONDANSETRON 8 MG PO TBDP: 8.0000 mg | ORAL_TABLET | Freq: Three times a day (TID) | ORAL | 0 refills | Status: DC | PRN

## 2022-03-06 NOTE — Telephone Encounter (Signed)
Let Meagan Hunter know I will send in a few more pills, but this needs to be done thru her GI - I gave her a refill last time d/t going out of town. Also, she should increase her Prilosec (Omeprazole) to 2 pills daily until she is seen again by GI to cut down on her sx causing the nausea & hopefully not need the zofran as much.

## 2022-03-07 LAB — ANA: Anti Nuclear Antibody (ANA): NEGATIVE

## 2022-03-07 LAB — ALPHA-1-ANTITRYPSIN: A-1 Antitrypsin, Ser: 145 mg/dL (ref 83–199)

## 2022-03-07 LAB — ANTI-SMOOTH MUSCLE ANTIBODY, IGG: Actin (Smooth Muscle) Antibody (IGG): <20 U (ref <20)

## 2022-03-07 LAB — CERULOPLASMIN: Ceruloplasmin: 23 mg/dL (ref 18–53)

## 2022-03-07 LAB — IGG: IgG (Immunoglobin G), Serum: 973 mg/dL (ref 600–1640)

## 2022-03-25 ENCOUNTER — Other Ambulatory Visit: Payer: Self-pay | Admitting: Family

## 2022-03-25 DIAGNOSIS — K3184 Gastroparesis: Secondary | ICD-10-CM

## 2022-03-25 MED ORDER — ONDANSETRON 8 MG PO TBDP: 8.0000 mg | ORAL_TABLET | Freq: Three times a day (TID) | ORAL | 0 refills | Status: DC | PRN

## 2022-04-04 ENCOUNTER — Other Ambulatory Visit: Payer: Self-pay | Admitting: Family

## 2022-04-04 DIAGNOSIS — K3184 Gastroparesis: Secondary | ICD-10-CM

## 2022-04-04 MED ORDER — ONDANSETRON 8 MG PO TBDP: 8.0000 mg | ORAL_TABLET | Freq: Three times a day (TID) | ORAL | 0 refills | Status: DC | PRN

## 2022-04-15 ENCOUNTER — Other Ambulatory Visit: Payer: Self-pay | Admitting: Family

## 2022-04-15 DIAGNOSIS — K3184 Gastroparesis: Secondary | ICD-10-CM

## 2022-04-15 MED ORDER — ONDANSETRON 8 MG PO TBDP: 8.0000 mg | ORAL_TABLET | Freq: Three times a day (TID) | ORAL | 0 refills | Status: DC | PRN
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
## 2022-04-17 ENCOUNTER — Ambulatory Visit (INDEPENDENT_AMBULATORY_CARE_PROVIDER_SITE_OTHER): Payer: Medicaid Other | Admitting: *Deleted

## 2022-04-17 ENCOUNTER — Other Ambulatory Visit (HOSPITAL_COMMUNITY)
Admission: RE | Admit: 2022-04-17 | Discharge: 2022-04-17 | Disposition: A | Discharge status: Home / Self Care | Payer: Medicaid Other | Source: Ambulatory Visit | Attending: Obstetrics & Gynecology | Admitting: Obstetrics & Gynecology

## 2022-04-17 ENCOUNTER — Ambulatory Visit: Payer: Medicaid Other | Admitting: Physician Assistant

## 2022-04-17 ENCOUNTER — Encounter (HOSPITAL_BASED_OUTPATIENT_CLINIC_OR_DEPARTMENT_OTHER): Payer: Self-pay | Admitting: *Deleted

## 2022-04-17 ENCOUNTER — Other Ambulatory Visit (INDEPENDENT_AMBULATORY_CARE_PROVIDER_SITE_OTHER): Payer: Medicaid Other

## 2022-04-17 VITALS — BP 111/73 | HR 74 | Wt 135.8 lb

## 2022-04-17 DIAGNOSIS — O3680X1 Pregnancy with inconclusive fetal viability, fetus 1: Secondary | ICD-10-CM

## 2022-04-17 DIAGNOSIS — Z3481 Encounter for supervision of other normal pregnancy, first trimester: Secondary | ICD-10-CM

## 2022-04-17 DIAGNOSIS — Z3A08 8 weeks gestation of pregnancy: Secondary | ICD-10-CM | POA: Diagnosis not present

## 2022-04-17 DIAGNOSIS — O3680X0 Pregnancy with inconclusive fetal viability, not applicable or unspecified: Secondary | ICD-10-CM

## 2022-04-17 MED ORDER — PROMETHAZINE HCL 25 MG PO TABS: 25.0000 mg | ORAL_TABLET | Freq: Four times a day (QID) | ORAL | 1 refills | Status: DC | PRN

## 2022-04-17 NOTE — Progress Notes (Addendum)
New OB Intake  I explained I am completing New OB Intake today. We discussed EDD of 11/23/22 that is based on LMP of 02/16/22. Pt is G4/P3. I reviewed her allergies, medications, Medical/Surgical/OB history, and appropriate screenings. I informed her of [facility] services.  Based on history, this is a low risk pregnancy.  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 Patient given information for [facility] Healthy Baby website for more information about Women's and Veteran. Patient is interested in water birth. Offered upcoming OB visit with CNM to discuss further.  MyChart/Babyscripts MyChart access verified. I explained pt will have some visits in office and some virtually. Babyscripts instructions given and order placed.   Blood Pressure Cuff/Weight Scale Blood pressure cuff ordered for patient to pick-up from [employer]. Explained after first prenatal appt pt will check weekly and document in 73. Patient does not have weight scale; order sent to [place], patient may track weight weekly in Babyscripts.  Anatomy US Explained first scheduled [place] will be around 19 weeks. Anatomy US scheduled for 07/01/22 at 0930. Pt notified to arrive at 0915.  Labs Discussed Johnsie Cancel genetic screening with patient. Would like both [place] has had Horizon drawn in past. Routine prenatal labs needed.    Social Determinants of Health Food Insecurity: Patient denies food insecurity. WIC Referral: Patient is interested in referral to [facility].  Transportation: Patient denies transportation needs. Childcare: Discussed no children allowed at ultrasound  appointments.   Pt currently taking zofran for nausea. Has taken in the past with her previous pregnancies. Advised pt that this is considered second line therapy due to concerns regarding possible fetal exposure and  is currently under investigation.  Pt is willing to try phenergan. Rx sent to pharmacy.    Blenda Nicely, RN 04/17/2022  9:28 AM

## 2022-04-18 LAB — CERVICOVAGINAL ANCILLARY ONLY
Chlamydia: NEGATIVE
Comment: NEGATIVE
Comment: NORMAL
Neisseria Gonorrhea: NEGATIVE

## 2022-04-19 LAB — URINE CULTURE

## 2022-04-19 MED ORDER — GOJJI WEIGHT SCALE MISC: 1.0000 | Freq: Once | 0 refills | Status: AC

## 2022-04-19 MED ORDER — BLOOD PRESSURE KIT DEVI: 1.0000 | Freq: Once | 0 refills | Status: AC

## 2022-04-29 ENCOUNTER — Other Ambulatory Visit (HOSPITAL_BASED_OUTPATIENT_CLINIC_OR_DEPARTMENT_OTHER): Payer: Medicaid Other

## 2022-04-29 DIAGNOSIS — Z3481 Encounter for supervision of other normal pregnancy, first trimester: Secondary | ICD-10-CM

## 2022-04-29 NOTE — Addendum Note (Signed)
Addended by: Blenda Nicely on: 04/29/2022 08:34 AM   Modules accepted: Orders

## 2022-04-30 ENCOUNTER — Ambulatory Visit (INDEPENDENT_AMBULATORY_CARE_PROVIDER_SITE_OTHER): Payer: Medicaid Other | Admitting: Advanced Practice Midwife

## 2022-04-30 VITALS — BP 124/76 | HR 102 | Temp 98.3°F | Wt 134.4 lb

## 2022-04-30 DIAGNOSIS — Z3A08 8 weeks gestation of pregnancy: Secondary | ICD-10-CM

## 2022-04-30 DIAGNOSIS — O021 Missed abortion: Secondary | ICD-10-CM

## 2022-04-30 DIAGNOSIS — O209 Hemorrhage in early pregnancy, unspecified: Secondary | ICD-10-CM

## 2022-04-30 DIAGNOSIS — O039 Complete or unspecified spontaneous abortion without complication: Secondary | ICD-10-CM

## 2022-04-30 HISTORY — DX: Complete or unspecified spontaneous abortion without complication: O03.9

## 2022-04-30 LAB — CBC
Hematocrit: 39.3 % (ref 34.0–46.6)
Hemoglobin: 13.5 g/dL (ref 11.1–15.9)
MCH: 31.6 pg (ref 26.6–33.0)
MCHC: 34.4 g/dL (ref 31.5–35.7)
MCV: 92 fL (ref 79–97)
Platelets: 226 10*3/uL (ref 150–450)
RBC: 4.27 x10E6/uL (ref 3.77–5.28)
RDW: 11.6 % — ABNORMAL LOW (ref 11.7–15.4)
WBC: 7.9 10*3/uL (ref 3.4–10.8)

## 2022-04-30 LAB — HIV ANTIBODY (ROUTINE TESTING W REFLEX): HIV Screen 4th Generation wRfx: NONREACTIVE

## 2022-04-30 LAB — HEPATITIS C ANTIBODY: Hep C Virus Ab: NONREACTIVE

## 2022-04-30 LAB — ABO/RH: Rh Factor: POSITIVE

## 2022-04-30 LAB — ANTIBODY SCREEN: Antibody Screen: NEGATIVE

## 2022-04-30 LAB — RUBELLA SCREEN: Rubella Antibodies, IGG: 9.22 index (ref >0.99)

## 2022-04-30 LAB — RPR: RPR Ser Ql: NONREACTIVE

## 2022-04-30 LAB — HEPATITIS B SURFACE ANTIGEN: Hepatitis B Surface Ag: NEGATIVE

## 2022-04-30 MED ORDER — TRAMADOL HCL 50 MG PO TABS: 50.0000 mg | ORAL_TABLET | Freq: Four times a day (QID) | ORAL | 0 refills | Status: DC | PRN

## 2022-04-30 MED ORDER — IBUPROFEN 600 MG PO TABS: 600.0000 mg | ORAL_TABLET | Freq: Four times a day (QID) | ORAL | 0 refills | Status: DC | PRN

## 2022-04-30 NOTE — Progress Notes (Signed)
   GYNECOLOGY PROGRESS NOTE  History:  32 y.o. [id number] at 2w3dby 8 week [place] vaginal bleeding with onset 04/29/22. She reports red bleeding, enough to soak a pantyliner but not requiring a pad.   She denies h/a, dizziness, shortness of breath, n/v, or fever/chills.    The following portions of the patient's history were reviewed and updated as appropriate: allergies, current medications, past family history, past medical history, past social history, past surgical history and problem list.   There are no preventive care reminders to display for this patient.   Review of Systems:  Pertinent items are noted in HPI.   Objective:  Physical Exam Blood pressure 124/76, pulse (!) 102, temperature 98.3 F (36.8 C), weight 134 lb 6.4 oz (61 kg), last menstrual period 02/16/2022, not currently breastfeeding. VS reviewed, nursing note reviewed,  Constitutional: well developed, well nourished, no distress HEENT: normocephalic CV: normal rate Pulm/chest wall: normal effort Breast Exam: deferred Abdomen: soft Neuro: alert and oriented x 3 Skin: warm, dry Psych: affect normal  Limited OB [place]oreaDate: 04/30/22 EDD : 11/26/22 based on 8 week UKoreaViability: FHT NOT detected, CRL [id number] today's [place] Pt informed that the ultrasound is considered a limited OB ultrasound and is not intended to be a complete ultrasound exam.  Patient also informed that the ultrasound is not being completed with the intent of assessing for fetal or placental anomalies or any pelvic abnormalities.  Explained that the purpose of today's ultrasound is to assess for  viability.  Patient acknowledges the purpose of the exam and the limitations of the study.     Assessment & Plan:  1. Missed abortion --Missed ab on TVUS confirmed at bedside today by Dr MiSabra Heck  --Pt upset, responding appropriately to loss, given opportunity to ask questions. --Discussed options with patient including  expectant management, Cytotec Rx, or D&C. Benefits/risks discussed for all options.  All options include close follow up in the office.  Pt chooses Cytotec Rx today which was sent to the pharmacy along with ibuprofen and Tramadol x 5 tabs only.     - ibuprofen (ADVIL) 600 MG tablet; Take 1 tablet (600 mg total) by mouth every 6 (six) hours as needed.  Dispense: 30 tablet; Refill: 0 - traMADol (ULTRAM) 50 MG tablet; Take 1-2 tablets (50-100 mg total) by mouth every 6 (six) hours as needed.  Dispense: 5 tablet; Refill: 0  2. Vaginal bleeding in pregnancy, first trimester    Return in about 2 weeks (around 05/14/2022).   LiFatima BlankCNM 12:50 PM

## 2022-05-01 ENCOUNTER — Encounter: Payer: Self-pay | Admitting: Family

## 2022-05-01 ENCOUNTER — Other Ambulatory Visit (HOSPITAL_BASED_OUTPATIENT_CLINIC_OR_DEPARTMENT_OTHER): Payer: Self-pay | Admitting: Obstetrics & Gynecology

## 2022-05-01 ENCOUNTER — Encounter (HOSPITAL_BASED_OUTPATIENT_CLINIC_OR_DEPARTMENT_OTHER): Payer: Self-pay | Admitting: Obstetrics & Gynecology

## 2022-05-01 DIAGNOSIS — F411 Generalized anxiety disorder: Secondary | ICD-10-CM

## 2022-05-01 DIAGNOSIS — O021 Missed abortion: Secondary | ICD-10-CM

## 2022-05-01 MED ORDER — MISOPROSTOL 200 MCG PO TABS: ORAL_TABLET | ORAL | 0 refills | Status: DC

## 2022-05-02 ENCOUNTER — Encounter (HOSPITAL_BASED_OUTPATIENT_CLINIC_OR_DEPARTMENT_OTHER): Payer: Self-pay | Admitting: Obstetrics & Gynecology

## 2022-05-03 ENCOUNTER — Ambulatory Visit (INDEPENDENT_AMBULATORY_CARE_PROVIDER_SITE_OTHER): Payer: Medicaid Other

## 2022-05-03 ENCOUNTER — Ambulatory Visit (INDEPENDENT_AMBULATORY_CARE_PROVIDER_SITE_OTHER): Payer: Medicaid Other | Admitting: Obstetrics & Gynecology

## 2022-05-03 ENCOUNTER — Inpatient Hospital Stay (HOSPITAL_COMMUNITY)
Admission: AD | Admit: 2022-05-03 | Discharge: 2022-05-03 | Disposition: A | Discharge status: Home / Self Care | Payer: Medicaid Other | Attending: Obstetrics and Gynecology | Admitting: Obstetrics and Gynecology

## 2022-05-03 ENCOUNTER — Encounter (HOSPITAL_COMMUNITY): Payer: Self-pay | Admitting: Obstetrics and Gynecology

## 2022-05-03 VITALS — BP 121/78 | HR 120 | Ht 64.0 in | Wt 133.2 lb

## 2022-05-03 DIAGNOSIS — R008 Other abnormalities of heart beat: Secondary | ICD-10-CM

## 2022-05-03 DIAGNOSIS — Z3A00 Weeks of gestation of pregnancy not specified: Secondary | ICD-10-CM

## 2022-05-03 DIAGNOSIS — R58 Hemorrhage, not elsewhere classified: Secondary | ICD-10-CM | POA: Diagnosis not present

## 2022-05-03 DIAGNOSIS — Z3A10 10 weeks gestation of pregnancy: Secondary | ICD-10-CM | POA: Diagnosis not present

## 2022-05-03 DIAGNOSIS — O039 Complete or unspecified spontaneous abortion without complication: Secondary | ICD-10-CM

## 2022-05-03 DIAGNOSIS — O021 Missed abortion: Secondary | ICD-10-CM

## 2022-05-03 LAB — CBC WITH DIFFERENTIAL/PLATELET
Abs Immature Granulocytes: 0.01 10*3/uL (ref 0.00–0.07)
Basophils Absolute: 0 10*3/uL (ref 0.0–0.1)
Basophils Relative: 0 %
Eosinophils Absolute: 0.1 10*3/uL (ref 0.0–0.5)
Eosinophils Relative: 1 %
HCT: 29.9 % — ABNORMAL LOW (ref 36.0–46.0)
Hemoglobin: 10.3 g/dL — ABNORMAL LOW (ref 12.0–15.0)
Immature Granulocytes: 0 %
Lymphocytes Relative: 37 %
Lymphs Abs: 1.7 10*3/uL (ref 0.7–4.0)
MCH: 32.5 pg (ref 26.0–34.0)
MCHC: 34.4 g/dL (ref 30.0–36.0)
MCV: 94.3 fL (ref 80.0–100.0)
Monocytes Absolute: 0.4 10*3/uL (ref 0.1–1.0)
Monocytes Relative: 8 %
Neutro Abs: 2.4 10*3/uL (ref 1.7–7.7)
Neutrophils Relative %: 54 %
Platelets: 161 10*3/uL (ref 150–400)
RBC: 3.17 MIL/uL — ABNORMAL LOW (ref 3.87–5.11)
RDW: 12 % (ref 11.5–15.5)
WBC: 4.5 10*3/uL (ref 4.0–10.5)
nRBC: 0 % (ref 0.0–0.2)

## 2022-05-03 LAB — TYPE AND SCREEN
ABO/RH(D): O POS
Antibody Screen: NEGATIVE

## 2022-05-03 MED ORDER — ASPIRIN-ACETAMINOPHEN-CAFFEINE 250-250-65 MG PO TABS: 2.0000 | ORAL_TABLET | Freq: Once | ORAL | Status: DC

## 2022-05-03 MED ORDER — LACTATED RINGERS IV BOLUS
1000.0000 mL | Freq: Once | INTRAVENOUS | Status: AC
  Administered 2022-05-03: 1000 mL via INTRAVENOUS

## 2022-05-03 MED ORDER — ACETAMINOPHEN-CAFFEINE 500-65 MG PO TABS
2.0000 | ORAL_TABLET | Freq: Once | ORAL | Status: AC
  Administered 2022-05-03: 2 via ORAL
  Filled 2022-05-03: qty 2

## 2022-05-03 MED ORDER — CYCLOBENZAPRINE HCL 5 MG PO TABS
10.0000 mg | ORAL_TABLET | Freq: Once | ORAL | Status: AC
  Administered 2022-05-03: 10 mg via ORAL
  Filled 2022-05-03: qty 2

## 2022-05-03 MED ORDER — ONDANSETRON HCL 4 MG/2ML IJ SOLN
4.0000 mg | Freq: Once | INTRAMUSCULAR | Status: AC
  Administered 2022-05-03: 4 mg via INTRAVENOUS
  Filled 2022-05-03: qty 2

## 2022-05-03 MED ORDER — CLONAZEPAM 0.5 MG PO TABS: 0.5000 mg | ORAL_TABLET | Freq: Every day | ORAL | 0 refills | Status: DC | PRN
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
## 2022-05-03 NOTE — Discharge Instructions (Signed)
Return to care  If you have heavier bleeding that soaks through more than 2 pads per hour for an hour or more If you bleed so much that you feel like you might pass out or you do pass out If you have significant abdominal pain that is not improved with medication

## 2022-05-03 NOTE — MAU Note (Signed)
.  Meagan Hunter is a 32 y.o. at [redacted]w[redacted]d here in MAU reporting: started having a miscarriage on Wednesday and was seen in the [facility] office. The bleeding was heavy and she was changing pads every hour or two, but has since decreased to only spotting since 2300 last night. She reports wearing a pad and has changed it once today. Small amount of bleeding on pad on RN assessment. She reports feeling lightheaded and dizzy. She is having some lower and upper abdominal cramping (3/10). She also started having some nausea after trying to eat an uncrustable on the way into the hospital, still has mild nausea, no emesis last 24 hours.  LMP: 02/16/2022 Onset of complaint: Wednesday Pain score: 3/10 Vitals:   05/03/22 1124 05/03/22 1125  BP: 118/70 117/76  Pulse: (!) 103 (!) 125  Resp:    Temp:       FHT:N/A Lab orders placed from triage:  Orthostatic VS, UA

## 2022-05-03 NOTE — MAU Provider Note (Signed)
History     [id number]  Arrival date and time: 05/03/22 1102    Chief Complaint  Patient presents with   Vaginal Bleeding   Dizziness     HPI Meagan Hunter is a 32 y.o. [phone] who presents for symptomatic anemia. Seen in the office this morning by Dr. Sabra Heck. Was diagnosed with MAB but completed miscarriage last night prior to taking cytotec. In the office today she was dizzy, pale, & tachycardic. Ultrasound showed empty uterus & minimal bleeding on pelvic exam. Patient sent to MAU for evaluation & management of anemia.  Patient states when she stands up she gets dizzy & her body feels "funny". Bleeding & cramping decreased since yesterday. Denies fever.    --/--/O POS (03/08 1137)  OB History     Gravida  4   Para  3   Term  3   Preterm      AB      Living  3      SAB  0   IAB  0   Ectopic  0   Multiple      Live Births  3           Past Medical History:  Diagnosis Date   Anemia    Anxiety    Asthma    Bipolar 1 disorder, depressed ([place]) 02/01/2016   Bipolar disorder, rapid cycling ([place]) 12/18/2015   Depression    Gastroparesis    GERD (gastroesophageal reflux disease)    Hiatal hernia    Kidney stones    Lupus ([place])    Peptic ulcer     Past Surgical History:  Procedure Laterality Date   CYST EXCISION     EYE   ESOPHAGOGASTRODUODENOSCOPY      Family History  Problem Relation Age of Onset   Hypertension Mother    Ovarian cancer Mother    Thyroid cancer Mother    Crohn's disease Mother    Hypertension Father    Hyperlipidemia Father    Heart failure Father    Hashimoto's thyroiditis Sister    Breast cancer Maternal Aunt    Heart failure Maternal Grandfather    Heart failure Paternal Grandfather    Colon cancer Paternal Grandfather     Social History   Socioeconomic History   Marital status: Single    Spouse name: Not on file   Number of children: 3   Years of education: Not on file   Highest education level: Not on file   Occupational History   Occupation: stay at home mom  Tobacco Use   Smoking status: Former    Types: Cigarettes    Quit date: 02/26/2015    Years since quitting: 7.1   Smokeless tobacco: Never  Vaping Use   Vaping Use: Never used  Substance and Sexual Activity   Alcohol use: Not Currently    Alcohol/week: 2.0 standard drinks of alcohol    Types: 2 Standard drinks or equivalent per week    Comment: 3 per week   Drug use: Never   Sexual activity: Not Currently    Birth control/protection: None  Other Topics Concern   Not on file  Social History Narrative   Not on file   Social Determinants of Health   Financial Resource Strain: Low Risk  (04/17/2022)   Overall Financial Resource Strain (CARDIA)    Difficulty of Paying Living Expenses: Not very hard  Food Insecurity: No Food Insecurity (04/17/2022)   Hunger Vital Sign    Worried About  Running Out of Food in the Last Year: Never true    [place] in the Last Year: Never true  Transportation Needs: No Transportation Needs (04/17/2022)   PRAPARE - Hydrologist (Medical): No    Lack of Transportation (Non-Medical): No  Physical Activity: Insufficiently Active (04/17/2022)   Exercise Vital Sign    Days of Exercise per Week: 3 days    Minutes of Exercise per Session: 20 min  Stress: No Stress Concern Present (04/17/2022)   [place]    Feeling of Stress : Not at all  Social Connections: Socially Isolated (04/17/2022)   Social Connection and Isolation Panel [NHANES]    Frequency of Communication with Friends and Family: More than three times a week    Frequency of Social Gatherings with Friends and Family: Once a week    Attends Religious Services: Never    Marine scientist or Organizations: No    Attends Archivist Meetings: Never    Marital Status: Divorced  Human resources officer Violence: Not At Risk (04/17/2022)    Humiliation, Afraid, Rape, and Kick questionnaire    Fear of Current or Ex-Partner: No    Emotionally Abused: No    Physically Abused: No    Sexually Abused: No    Allergies  Allergen Reactions   Penicillins Hives, Itching, Nausea And Vomiting and Other (See Comments)    Other reaction(s): Fever Fever, rash and vomiting Fever, rash and vomiting Other reaction(s): Fever Fever, rash and vomiting   Zoloft [Sertraline] Other (See Comments)    seizures Other reaction(s): Seizures seizures    No current facility-administered medications on file prior to encounter.   Current Outpatient Medications on File Prior to Encounter  Medication Sig Dispense Refill   ibuprofen (ADVIL) 600 MG tablet Take 1 tablet (600 mg total) by mouth every 6 (six) hours as needed. 30 tablet 0   omeprazole (PRILOSEC) 20 MG capsule Take 20 mg by mouth daily.     Prenatal Vit-Fe Fumarate-FA (PRENATAL MULTIVITAMIN) TABS tablet Take 1 tablet by mouth daily at 12 noon.     ranitidine (ZANTAC) 150 MG tablet Take 150 mg by mouth daily.     acetaminophen (TYLENOL) 500 MG tablet Take 500-1,000 mg by mouth 2 (two) times daily.     amphetamine-dextroamphetamine (ADDERALL XR) 20 MG 24 hr capsule Take 1 capsule (20 mg total) by mouth every morning. 30 capsule 0   amphetamine-dextroamphetamine (ADDERALL XR) 20 MG 24 hr capsule Take 1 capsule (20 mg total) by mouth every morning. (Patient not taking: Reported on 04/17/2022) 30 capsule 0   misoprostol (CYTOTEC) 200 MCG tablet Place 4 tablets buccally or vaginally (all at once).  Can be repeated in 12-24 hours if miscarriage is not complete. 8 tablet 0   ondansetron (ZOFRAN-ODT) 8 MG disintegrating tablet Take 1 tablet (8 mg total) by mouth every 8 (eight) hours as needed for nausea or vomiting. 15 tablet 0   promethazine (PHENERGAN) 25 MG tablet Take 1 tablet (25 mg total) by mouth every 6 (six) hours as needed for nausea or vomiting. 30 tablet 1   traMADol (ULTRAM) 50 MG tablet  Take 1-2 tablets (50-100 mg total) by mouth every 6 (six) hours as needed. 5 tablet 0   VENTOLIN HFA 108 (90 Base) MCG/ACT inhaler Inhale 1 puff into the lungs as needed.       ROS Pertinent positives and negative per HPI, all  others reviewed and negative  Physical Exam   BP 126/69   Pulse (!) 115   Temp 99.4 F (37.4 C) (Oral)   Resp 14   LMP 02/16/2022 (Exact Date)   SpO2 100%   Patient Vitals for the past 24 hrs:  BP Temp Temp src Pulse Resp SpO2  05/03/22 1300 126/69 -- -- (!) 115 -- --  05/03/22 1259 120/72 -- -- 99 -- --  05/03/22 1258 107/66 -- -- 86 -- --  05/03/22 1126 109/79 -- -- (!) 129 -- 100 %  05/03/22 1125 117/76 -- -- (!) 125 -- --  05/03/22 1124 118/70 -- -- (!) 103 -- --  05/03/22 1117 117/63 99.4 F (37.4 C) Oral 90 14 --    Physical Exam Vitals and nursing note reviewed.  Constitutional:      General: She is not in acute distress.    Appearance: She is well-developed. She is not ill-appearing.  HENT:     Head: Normocephalic and atraumatic.  Eyes:     General: No scleral icterus.       Right eye: No discharge.        Left eye: No discharge.     Conjunctiva/sclera: Conjunctivae normal.  Pulmonary:     Effort: Pulmonary effort is normal. No respiratory distress.  Abdominal:     General: Abdomen is flat. There is no distension.     Palpations: Abdomen is soft.     Tenderness: There is no abdominal tenderness. There is no guarding.  Neurological:     General: No focal deficit present.     Mental Status: She is alert.  Psychiatric:        Mood and Affect: Mood normal.        Behavior: Behavior normal.       Labs Results for orders placed or performed during the hospital encounter of 05/03/22 (from the past 24 hour(s))  Type and screen     Status: None   Collection Time: 05/03/22 11:37 AM  Result Value Ref Range   ABO/RH(D) O POS    Antibody Screen NEG    Sample Expiration      05/06/2022,2359 Performed at [facility], [place] [address] [place], [place] [address]   CBC with Differential/Platelet     Status: Abnormal   Collection Time: 05/03/22 11:37 AM  Result Value Ref Range   WBC 4.5 4.0 - 10.5 K/uL   RBC 3.17 (L) 3.87 - 5.11 MIL/uL   Hemoglobin 10.3 (L) 12.0 - 15.0 g/dL   HCT 29.9 (L) 36.0 - 46.0 %   MCV 94.3 80.0 - 100.0 fL   MCH 32.5 26.0 - 34.0 pg   MCHC 34.4 30.0 - 36.0 g/dL   RDW 12.0 11.5 - 15.5 %   Platelets 161 150 - 400 K/uL   nRBC 0.0 0.0 - 0.2 %   Neutrophils Relative % 54 %   Neutro Abs 2.4 1.7 - 7.7 K/uL   Lymphocytes Relative 37 %   Lymphs Abs 1.7 0.7 - 4.0 K/uL   Monocytes Relative 8 %   Monocytes Absolute 0.4 0.1 - 1.0 K/uL   Eosinophils Relative 1 %   Eosinophils Absolute 0.1 0.0 - 0.5 K/uL   Basophils Relative 0 %   Basophils Absolute 0.0 0.0 - 0.1 K/uL   Immature Granulocytes 0 %   Abs Immature Granulocytes 0.01 0.00 - 0.07 K/uL    Imaging US OB Transvaginal  Result Date: 05/03/2022 Limited bedside ultrasound performed today. Uterus  normal.  No free fluid in PCDS.  Endometrium echogenic without discrete mass measuring 1.7cm at thickest measurement.  No distinct area of defined POCS noted.    MAU Course  Procedures Lab Orders         CBC with Differential/Platelet    Meds ordered this encounter  Medications   lactated ringers bolus 1,000 mL   ondansetron (ZOFRAN) injection 4 mg   lactated ringers bolus 1,000 mL   cyclobenzaprine (FLEXERIL) tablet 10 mg   DISCONTD: aspirin-acetaminophen-caffeine (EXCEDRIN MIGRAINE) per tablet 2 tablet   acetaminophen-caffeine (EXCEDRIN TENSION HEADACHE) 500-65 MG per tablet 2 tablet   Imaging Orders  No imaging studies ordered today    MDM moderate  Assessment and Plan   1. Miscarriage at 8 to [redacted] weeks gestation   2. Symptomatic orthostatic increase in heart rate   3. [redacted] weeks gestation of pregnancy    -Patient orthostatic on arrival. After 2 liters of IV fluids she reports improvement in symptoms. Hemoglobin is 10.3 today down  from 13.5 earlier this week. Blood transfusion nor IV iron indicated at this time. Patient given excedrin & flexeril for headache with resolution of symptoms. Patient discharged after consultation with Dr. Elgie Congo.  -Reviewed reasons to return to MAU -increase dietary iron & take OTC iron supplements until f/u with Dr. Sabra Heck later this month  Dispo: discharged to home in stable condition.   Discharge Instructions     Discharge patient   Complete by: As directed    Discharge disposition: 01-Home or Self Care   Discharge patient date: 05/03/2022       Jorje Guild, NP 05/03/22 3:34 PM  Allergies as of 05/03/2022       Reactions   Penicillins Hives, Itching, Nausea And Vomiting, Other (See Comments)   Other reaction(s): Fever Fever, rash and vomiting Fever, rash and vomiting Other reaction(s): Fever Fever, rash and vomiting   Zoloft [sertraline] Other (See Comments)   seizures Other reaction(s): Seizures seizures        Medication List     TAKE these medications    acetaminophen 500 MG tablet Commonly known as: TYLENOL Take 500-1,000 mg by mouth 2 (two) times daily.   amphetamine-dextroamphetamine 20 MG 24 hr capsule Commonly known as: Adderall XR Take 1 capsule (20 mg total) by mouth every morning.   amphetamine-dextroamphetamine 20 MG 24 hr capsule Commonly known as: Adderall XR Take 1 capsule (20 mg total) by mouth every morning.   ibuprofen 600 MG tablet Commonly known as: ADVIL Take 1 tablet (600 mg total) by mouth every 6 (six) hours as needed.   misoprostol 200 MCG tablet Commonly known as: CYTOTEC Place 4 tablets buccally or vaginally (all at once).  Can be repeated in 12-24 hours if miscarriage is not complete.   omeprazole 20 MG capsule Commonly known as: PRILOSEC Take 20 mg by mouth daily.   ondansetron 8 MG disintegrating tablet Commonly known as: ZOFRAN-ODT Take 1 tablet (8 mg total) by mouth every 8 (eight) hours as needed for nausea or  vomiting.   prenatal multivitamin Tabs tablet Take 1 tablet by mouth daily at 12 noon.   promethazine 25 MG tablet Commonly known as: PHENERGAN Take 1 tablet (25 mg total) by mouth every 6 (six) hours as needed for nausea or vomiting.   ranitidine 150 MG tablet Commonly known as: ZANTAC Take 150 mg by mouth daily.   traMADol 50 MG tablet Commonly known as: ULTRAM Take 1-2 tablets (50-100 mg total) by mouth every 6 (  six) hours as needed.   Ventolin HFA 108 (90 Base) MCG/ACT inhaler Generic drug: albuterol Inhale 1 puff into the lungs as needed.

## 2022-05-06 LAB — PANORAMA PRENATAL TEST FULL PANEL:PANORAMA TEST PLUS 5 ADDITIONAL MICRODELETIONS: FETAL FRACTION: 5.4

## 2022-05-07 ENCOUNTER — Encounter (HOSPITAL_BASED_OUTPATIENT_CLINIC_OR_DEPARTMENT_OTHER): Payer: Medicaid Other | Admitting: Obstetrics & Gynecology

## 2022-05-07 LAB — HORIZON CUSTOM: REPORT SUMMARY: NEGATIVE

## 2022-05-08 ENCOUNTER — Other Ambulatory Visit: Payer: Self-pay | Admitting: Family

## 2022-05-08 DIAGNOSIS — K3184 Gastroparesis: Secondary | ICD-10-CM

## 2022-05-09 ENCOUNTER — Telehealth (HOSPITAL_BASED_OUTPATIENT_CLINIC_OR_DEPARTMENT_OTHER): Payer: Self-pay | Admitting: *Deleted

## 2022-05-09 NOTE — Telephone Encounter (Signed)
-----   Message from Mary S Miller, MD sent at 05/07/2022 10:48 PM EDT ----- Regarding: pt from friday Kim, Would you please call this pt and check on her for me?  Just want to make sure bleeding really did taper off over the weekend.  Thanks.  Suzanne  

## 2022-05-09 NOTE — Telephone Encounter (Signed)
Attempted to call pt for update on how she is feeling. LMOVM for pt to call office.

## 2022-05-10 ENCOUNTER — Encounter: Payer: Self-pay | Admitting: Family

## 2022-05-10 DIAGNOSIS — F902 Attention-deficit hyperactivity disorder, combined type: Secondary | ICD-10-CM

## 2022-05-14 MED ORDER — AMPHETAMINE-DEXTROAMPHETAMINE 10 MG PO TABS: 10.0000 mg | ORAL_TABLET | Freq: Two times a day (BID) | ORAL | 0 refills | Status: DC

## 2022-05-15 ENCOUNTER — Telehealth (HOSPITAL_BASED_OUTPATIENT_CLINIC_OR_DEPARTMENT_OTHER): Payer: Self-pay | Admitting: *Deleted

## 2022-05-15 ENCOUNTER — Encounter: Payer: Medicaid Other | Admitting: Family Medicine

## 2022-05-15 NOTE — Telephone Encounter (Signed)
Attempted to call pt to see how she is feeling s/p SAB. LMOVM for pt to call office.

## 2022-05-15 NOTE — Telephone Encounter (Signed)
-----   Message from Megan Salon, MD sent at 05/07/2022 10:48 PM EDT ----- Regarding: pt from Meagan Hunter, Would you please call this pt and check on her for me?  Just want to make sure bleeding really did taper off over the weekend.  Thanks.  Vinnie Level

## 2022-05-16 ENCOUNTER — Ambulatory Visit (INDEPENDENT_AMBULATORY_CARE_PROVIDER_SITE_OTHER): Payer: Medicaid Other | Admitting: Obstetrics & Gynecology

## 2022-05-16 ENCOUNTER — Encounter (HOSPITAL_BASED_OUTPATIENT_CLINIC_OR_DEPARTMENT_OTHER): Payer: Self-pay | Admitting: Obstetrics & Gynecology

## 2022-05-16 VITALS — BP 130/84 | HR 125 | Ht 64.0 in | Wt 132.6 lb

## 2022-05-16 DIAGNOSIS — F53 Postpartum depression: Secondary | ICD-10-CM | POA: Diagnosis not present

## 2022-05-16 DIAGNOSIS — D649 Anemia, unspecified: Secondary | ICD-10-CM

## 2022-05-16 DIAGNOSIS — Z3A00 Weeks of gestation of pregnancy not specified: Secondary | ICD-10-CM

## 2022-05-16 DIAGNOSIS — O039 Complete or unspecified spontaneous abortion without complication: Secondary | ICD-10-CM

## 2022-05-16 DIAGNOSIS — Z840 Family history of diseases of the skin and subcutaneous tissue: Secondary | ICD-10-CM | POA: Diagnosis not present

## 2022-05-16 NOTE — Progress Notes (Signed)
GYNECOLOGY  VISIT  CC:   follow up after miscarriage  HPI: 32 y.o. [id number] Partnered White or Caucasian female here for follow up after miscarriage.  She did have a significant amount of bleeding with her miscarriage.  Bleeding is down to just brownish discharge.    Feels very sad and anxious since miscarriage.  Amount of bleeding and the several days that this lasted felt traumatic for her.  Has been on several different anti-depressants in the past.  Has had multiple side effects so is nervous about being on anything risk now.  Does take clonazepam.  This does help her anxiety.  Edinburgh post natal depression score:  21.   She would like to consider therapy.  Miscarriage risks discussed.  Timing for next pregnancy (if decides to proceed) discussed.  Pt has hx of lupus but reports blood work last year was normal.  Recommended lupus anticoagulant testing and antiphospholipid antibody testing given lupus diagnosis.  Pt reports she has no access to lab work done for lupus diagnosis as that office has closed and they were never on an electronic medical records.  Does report she has fatigue but this is expected considering her anemia.  She is taking oral iron.                           Past Medical History:  Diagnosis Date   Anemia    Anxiety    Asthma    Bipolar 1 disorder, depressed ([place]) 02/01/2016   Bipolar disorder, rapid cycling ([place]) 12/18/2015   Depression    Gastroparesis    GERD (gastroesophageal reflux disease)    Hiatal hernia    Kidney stones    Lupus (HCC)    Peptic ulcer     MEDS:   Current Outpatient Medications on File Prior to Visit  Medication Sig Dispense Refill   acetaminophen (TYLENOL) 500 MG tablet Take 500-1,000 mg by mouth 2 (two) times daily.     amphetamine-dextroamphetamine (ADDERALL) 10 MG tablet Take 1 tablet (10 mg total) by mouth 2 (two) times daily. 60 tablet 0   clonazePAM (KLONOPIN) 0.5 MG tablet Take 1 tablet (0.5 mg total) by mouth daily as needed  for anxiety. 30 tablet 0   ibuprofen (ADVIL) 600 MG tablet Take 1 tablet (600 mg total) by mouth every 6 (six) hours as needed. 30 tablet 0   omeprazole (PRILOSEC) 20 MG capsule Take 20 mg by mouth daily.     ondansetron (ZOFRAN-ODT) 8 MG disintegrating tablet Take 1 tablet (8 mg total) by mouth every 8 (eight) hours as needed for nausea or vomiting. 15 tablet 0   Prenatal Vit-Fe Fumarate-FA (PRENATAL MULTIVITAMIN) TABS tablet Take 1 tablet by mouth daily at 12 noon.     promethazine (PHENERGAN) 25 MG tablet Take 1 tablet (25 mg total) by mouth every 6 (six) hours as needed for nausea or vomiting. 30 tablet 1   ranitidine (ZANTAC) 150 MG tablet Take 150 mg by mouth daily.     VENTOLIN HFA 108 (90 Base) MCG/ACT inhaler Inhale 1 puff into the lungs as needed.     amphetamine-dextroamphetamine (ADDERALL XR) 20 MG 24 hr capsule Take 1 capsule (20 mg total) by mouth every morning. 30 capsule 0   amphetamine-dextroamphetamine (ADDERALL XR) 20 MG 24 hr capsule Take 1 capsule (20 mg total) by mouth every morning. (Patient not taking: Reported on 04/17/2022) 30 capsule 0   No current facility-administered medications on file prior to  visit.    ALLERGIES: Penicillins and Zoloft [sertraline]  SH:  partnered, non smoker  Review of Systems  Psychiatric/Behavioral:  The patient is nervous/anxious.     PHYSICAL EXAMINATION:    BP 130/84 (BP Location: Right Arm, Patient Position: Sitting, Cuff Size: Normal)   Pulse (!) 125   Ht 5\' 4"  (1.626 m) Comment: Reported  Wt 132 lb 9.6 oz (60.1 kg)   LMP 02/16/2022 (Exact Date)   BMI 22.76 kg/m     General appearance: alert, cooperative and appears stated age Abdomen: soft, non-tender; bowel sounds normal; no masses,  no organomegaly Lymph:  no inguinal LAD noted  Pelvic: External genitalia:  no lesions              Urethra:  normal appearing urethra with no masses, tenderness or lesions              Bartholins and Skenes: normal                 Vagina:  normal appearing vagina with normal color and discharge, no lesions              Cervix: no lesions              Bimanual Exam:  Uterus:  normal size, contour, position, consistency, mobility, non-tender              Adnexa: no mass, fullness, tenderness  Chaperone, Octaviano Batty, CMA, was present for exam.  Assessment/Plan: 1. Postpartum depression - medication discussed.  Declines for now.  She desires to continue her clonazepam. - Ambulatory referral to [place]  2. Miscarriage - doing well  3. Family history of lupus erythematosus - Lupus anticoagulant panel( [facility]); Future - Antiphospholipid Syndrome Comp; Future  4. Anemia - taking oral iron - suggested repeating hb if fatigue continues after iron for two months

## 2022-05-19 ENCOUNTER — Encounter: Payer: Self-pay | Admitting: Family

## 2022-05-22 NOTE — Telephone Encounter (Signed)
Let Meagan Hunter know she will need an appointment, and I can only increase her dose to 0.5mg  bid for 1 month and refer her to Psychiatry if she thinks she will need higher doses for longer.

## 2022-05-23 ENCOUNTER — Other Ambulatory Visit (HOSPITAL_BASED_OUTPATIENT_CLINIC_OR_DEPARTMENT_OTHER): Payer: Self-pay | Admitting: Obstetrics & Gynecology

## 2022-05-23 ENCOUNTER — Encounter (HOSPITAL_BASED_OUTPATIENT_CLINIC_OR_DEPARTMENT_OTHER): Payer: Self-pay | Admitting: Obstetrics & Gynecology

## 2022-05-23 ENCOUNTER — Other Ambulatory Visit: Payer: Self-pay | Admitting: Family

## 2022-05-23 ENCOUNTER — Other Ambulatory Visit (HOSPITAL_BASED_OUTPATIENT_CLINIC_OR_DEPARTMENT_OTHER): Payer: Self-pay | Admitting: *Deleted

## 2022-05-23 ENCOUNTER — Ambulatory Visit (HOSPITAL_BASED_OUTPATIENT_CLINIC_OR_DEPARTMENT_OTHER)
Admission: RE | Admit: 2022-05-23 | Discharge: 2022-05-23 | Disposition: A | Discharge status: Home / Self Care | Payer: Medicaid Other | Source: Ambulatory Visit | Attending: Obstetrics & Gynecology | Admitting: Obstetrics & Gynecology

## 2022-05-23 ENCOUNTER — Other Ambulatory Visit (HOSPITAL_BASED_OUTPATIENT_CLINIC_OR_DEPARTMENT_OTHER): Payer: Medicaid Other

## 2022-05-23 DIAGNOSIS — O039 Complete or unspecified spontaneous abortion without complication: Secondary | ICD-10-CM

## 2022-05-23 DIAGNOSIS — N926 Irregular menstruation, unspecified: Secondary | ICD-10-CM

## 2022-05-23 DIAGNOSIS — F411 Generalized anxiety disorder: Secondary | ICD-10-CM

## 2022-05-23 MED ORDER — CLONAZEPAM 0.5 MG PO TABS: 0.5000 mg | ORAL_TABLET | Freq: Two times a day (BID) | ORAL | 0 refills | Status: DC | PRN

## 2022-05-23 NOTE — Telephone Encounter (Signed)
I called pt and left a voicemail in regards.  

## 2022-05-23 NOTE — Telephone Encounter (Signed)
Can let her know I have sent 10 pills, hopefully pharmacy will fill, but since it is controlled, they can override my RX. If she can't get the RX, she may need to go to ER or UC for her high heart rate, thx.

## 2022-05-23 NOTE — Telephone Encounter (Signed)
Please call pt back, she is still having issues with meds and would like to talk to someone. Please advise.

## 2022-05-24 ENCOUNTER — Other Ambulatory Visit (HOSPITAL_BASED_OUTPATIENT_CLINIC_OR_DEPARTMENT_OTHER): Payer: Self-pay | Admitting: Obstetrics & Gynecology

## 2022-05-24 DIAGNOSIS — O021 Missed abortion: Secondary | ICD-10-CM

## 2022-05-24 LAB — BETA HCG QUANT (REF LAB): hCG Quant: 12 m[IU]/mL

## 2022-05-24 MED ORDER — IBUPROFEN 800 MG PO TABS: 800.0000 mg | ORAL_TABLET | Freq: Three times a day (TID) | ORAL | 0 refills | Status: DC | PRN

## 2022-05-27 ENCOUNTER — Encounter: Payer: Self-pay | Admitting: Family

## 2022-05-27 ENCOUNTER — Ambulatory Visit (INDEPENDENT_AMBULATORY_CARE_PROVIDER_SITE_OTHER): Payer: Medicaid Other | Admitting: Family

## 2022-05-27 VITALS — BP 120/87 | HR 133 | Temp 97.2°F | Ht 64.0 in | Wt 136.6 lb

## 2022-05-27 DIAGNOSIS — F902 Attention-deficit hyperactivity disorder, combined type: Secondary | ICD-10-CM

## 2022-05-27 DIAGNOSIS — F411 Generalized anxiety disorder: Secondary | ICD-10-CM | POA: Diagnosis not present

## 2022-05-27 DIAGNOSIS — R000 Tachycardia, unspecified: Secondary | ICD-10-CM

## 2022-05-27 MED ORDER — AMPHETAMINE-DEXTROAMPHET ER 20 MG PO CP24: 20.0000 mg | ORAL_CAPSULE | ORAL | 0 refills | Status: DC

## 2022-05-27 MED ORDER — PROPRANOLOL HCL 10 MG PO TABS: 10.0000 mg | ORAL_TABLET | Freq: Three times a day (TID) | ORAL | 1 refills | Status: DC | PRN

## 2022-05-27 MED ORDER — CLONAZEPAM 0.5 MG PO TABS: 0.5000 mg | ORAL_TABLET | Freq: Two times a day (BID) | ORAL | 0 refills | Status: DC | PRN

## 2022-05-27 NOTE — Assessment & Plan Note (Signed)
chronic had to take Adderall 10mg  IR last month d/t shortage, states this made her too jittery, anxious would like to resume 20mg ER  advised pt ER can also worsen anxiety, she will need to monitor this printing RX for pt take with her to find pharmacy that has in stock f/u 3 month

## 2022-05-27 NOTE — BH Specialist Note (Unsigned)
Integrated Behavioral Health via Telemedicine Visit  06/10/2022 Meagan Hunter [id number]  Number of Integrated Behavioral Health Clinician visits: 1- Initial Visit  Session Start time: 0950   Session End time: 1046  Total time in minutes: 56   Referring Provider: Valentina Shaggy, MD Patient/Family location: Home [facility] Provider location: [facility] for [facility]  All persons participating in visit: Patient Meagan Hunter and [facility] Analise Glotfelty   Types of Service: Individual psychotherapy and Video visit  I connected with [name] and/or Rometta Emery  n/a  via  Telephone or Video Enabled Telemedicine Application  (Video is Caregility application) and verified that I am speaking with the correct person using two identifiers. Discussed confidentiality: Yes   I discussed the limitations of telemedicine and the availability of in person appointments.  Discussed there is a possibility of technology failure and discussed alternative modes of communication if that failure occurs.  I discussed that engaging in this telemedicine visit, they consent to the provision of behavioral healthcare and the services will be billed under their insurance.  Patient and/or legal guardian expressed understanding and consented to Telemedicine visit: Yes   Presenting Concerns: Patient and/or family reports the following symptoms/concerns: Grieving the traumatic loss of pregnancy(bleeding off and on for over one month, anemia with blood transfusion, migraine), followed by loss of father this week; poor appetite and poor sleep since losses; pt is considering adding antidepressant (no sertraline, caused seizures) and switching from Klonopin to safer medication to manage mood instability that she attributes to PMDD and ADHD. Pt is open to referral to psychiatry, as well as implementing self-coping strategy today.  Duration of problem: Over one month pregnancy loss; less  than one week loss of father; ongoing mood instability ; Severity of problem: severe  Patient and/or Family's Strengths/Protective Factors: Social connections and Sense of purpose  Goals Addressed: Patient will:  Reduce symptoms of: anxiety, depression, insomnia, mood instability, and stress   Increase knowledge and/or ability of: healthy habits and self-management skills   Demonstrate ability to: Increase healthy adjustment to current life circumstances, Increase adequate support systems for patient/family, and Increase motivation to adhere to plan of care  Progress towards Goals: Ongoing  Interventions: Interventions utilized:  Mindfulness or Relaxation Training, Supportive Counseling, and Psychoeducation and/or Health Education Standardized Assessments completed: Not Needed  Patient and/or Family Response: Patient agrees with treatment plan.   Assessment: Patient currently experiencing Grief, PMDD, ADHD.   Patient may benefit from psychoeducation and brief therapeutic interventions regarding coping with symptoms of depression, anxiety, life stress, mood instability .  Plan: Follow up with behavioral health clinician on : Two weeks Behavioral recommendations:  -Continue taking BH medications as prescribed by PCP (Klonopin and Adderall); continue taking iron pills as prescribed by ob/gyn provider -Accept referral to psychiatry -CALM relaxation breathing exercise twice daily (morning; at bedtime); as needed throughout the day. -Consider Grief support for additional support ([facility]  and Pregnancy loss support groups) Referral(s): Integrated Art gallery manager (In Clinic), Community Mental Health Services (LME/Outside Clinic), and grief support groups  I discussed the assessment and treatment plan with the patient and/or parent/guardian. They were provided an opportunity to ask questions and all were answered. They agreed with the plan and demonstrated an understanding  of the instructions.   They were advised to call back or seek an in-person evaluation if the symptoms worsen or if the condition fails to improve as anticipated.  Rae Lips, LCSW     05/27/2022  11:14 AM 05/16/2022    8:25 AM 04/17/2022    9:40 AM 11/15/2020    1:57 PM 09/28/2020    9:05 AM  Depression screen PHQ 2/9  Decreased Interest 2 0 0 0 0  Down, Depressed, Hopeless 2 0 0 0 0  PHQ - 2 Score 4 0 0 0 0  Altered sleeping 1  0 0 0  Tired, decreased energy Change in appetite 1  0 1 1  Feeling bad or failure about yourself  3  0 0 0  Trouble concentrating 2  1 0 0  Moving slowly or fidgety/restless 2  0 0 0  Suicidal thoughts 0  0 0 0  PHQ-9 Score Difficult doing work/chores Extremely dIfficult    Not difficult at all      05/27/2022   11:14 AM 04/17/2022    9:41 AM 11/15/2020    1:58 PM 09/28/2020    9:06 AM  GAD 7 : Generalized Anxiety Score  Nervous, Anxious, on Edge 3 0 0 0  Control/stop worrying 3 0 0 0  Worry too much - different things 3 0 0 0  Trouble relaxing 3 0 0 0  Restless 3 0 0 0  Easily annoyed or irritable 3 0 0 0  Afraid - awful might happen 3 0 0 0  Total GAD 7 Score 21 0 0 0  Anxiety Difficulty Extremely difficult   Not difficult at all

## 2022-05-27 NOTE — Assessment & Plan Note (Signed)
chronic, Unstable pt taking only Klonopin for her anxiety - did not take the Effexor had a recent miscarriage which has increased her anxiety requesting increased dose of Klonopin sending 0.5mg  bid x 1 month only, given a 10d supply bid also until visit today pt reminded of addiction profile of Benzos also starting Propranolol d/t tachycardia, advised pt on benefits to anxiety with this med as well f/u 3 mo or prn

## 2022-05-27 NOTE — Progress Notes (Signed)
Patient ID: Meagan Hunter, female    DOB: 01/11/1991, 32 y.o.   MRN: [id number]  Chief Complaint  Patient presents with  . ADHD  . Anxiety    HPI: PMDD/Anxiety/Tachycardia:  reports sx are worsening, had some postpartum sx. Anxiety is worse during her luteal phase & she has tracked using a phone app, but also having anxiety sx daily. Had been on Zoloft in past and caused seizures, wants to avoid SSRI meds. Had also been on Effexor in past. Has been seeing a therapist. Reports having a miscarriage a little over a month ago and has not been handling well, she has been having episodes of very high heart rate, even when resting. She is requesting higher dose of her Klonopin.  ADHD f/u: Medications helping target goals: Adderall 20mg  ER, pt denies SE, helped sx slightly. Reports having to take IR adderall last month d/t shortage, which pt did not like and worsened her anxiety. Regimen: taking most days Medication side effects/concerns:  none Weight: wnl Sleep: normal Mood changes: denies Tics:  denies Blood pressure, Weight, Pulse, Behavior reviewed: wnl   Assessment & Plan:  Generalized anxiety disorder Assessment & Plan: chronic pt taking only Klonopin for her anxiety - did not take the Effexor had a recent miscarriage which has increased her anxiety requesting increased dose of Klonopin sending 0.5mg  bid x 1 month only, given a 10d supply bid also until visit today pt reminded of addiction profile of Benzos also starting Propranolol d/t tachycardia, advised pt on benefits to anxiety with this med as well f/u 3 mo or prn  Orders: -     clonazePAM; Take 1 tablet (0.5 mg total) by mouth 2 (two) times daily as needed for anxiety.  Dispense: 60 tablet; Refill: 0 -     Propranolol HCl; Take 1 tablet (10 mg total) by mouth 3 (three) times daily as needed (Fast heart rate and anxiety).  Dispense: 60 tablet; Refill: 1  Tachycardia -     Propranolol HCl; Take 1 tablet (10 mg total) by  mouth 3 (three) times daily as needed (Fast heart rate and anxiety).  Dispense: 60 tablet; Refill: 1  ADHD (attention deficit hyperactivity disorder), combined type Assessment & Plan: chronic had to take Adderall 10mg  IR last month d/t shortage, states this made her too jittery, anxious would like to resume 20mg ER  advised pt ER can also worsen anxiety, she will need to monitor this printing RX for pt take with her to find pharmacy that has in stock f/u 3 month   Orders: -     Amphetamine-Dextroamphet ER; Take 1 capsule (20 mg total) by mouth every morning.  Dispense: 30 capsule; Refill: 0    Subjective:    Outpatient Medications Prior to Visit  Medication Sig Dispense Refill  . acetaminophen (TYLENOL) 500 MG tablet Take 500-1,000 mg by mouth 2 (two) times daily.    Marland Kitchen amphetamine-dextroamphetamine (ADDERALL XR) 20 MG 24 hr capsule Take 1 capsule (20 mg total) by mouth every morning. 30 capsule 0  . ibuprofen (ADVIL) 800 MG tablet Take 1 tablet (800 mg total) by mouth every 8 (eight) hours as needed. 30 tablet 0  . omeprazole (PRILOSEC) 20 MG capsule Take 20 mg by mouth daily.    . ondansetron (ZOFRAN-ODT) 8 MG disintegrating tablet Take 1 tablet (8 mg total) by mouth every 8 (eight) hours as needed for nausea or vomiting. 15 tablet 0  . Prenatal Vit-Fe Fumarate-FA (PRENATAL MULTIVITAMIN) TABS tablet Take 1 tablet by  mouth daily at 12 noon.    . promethazine (PHENERGAN) 25 MG tablet Take 1 tablet (25 mg total) by mouth every 6 (six) hours as needed for nausea or vomiting. 30 tablet 1  . ranitidine (ZANTAC) 150 MG tablet Take 150 mg by mouth daily.    . VENTOLIN HFA 108 (90 Base) MCG/ACT inhaler Inhale 1 puff into the lungs as needed.    Marland Kitchen amphetamine-dextroamphetamine (ADDERALL) 10 MG tablet Take 1 tablet (10 mg total) by mouth 2 (two) times daily. 60 tablet 0  . clonazePAM (KLONOPIN) 0.5 MG tablet Take 1 tablet (0.5 mg total) by mouth 2 (two) times daily as needed for anxiety. 10 tablet  0  . amphetamine-dextroamphetamine (ADDERALL XR) 20 MG 24 hr capsule Take 1 capsule (20 mg total) by mouth every morning. 30 capsule 0   No facility-administered medications prior to visit.   Past Medical History:  Diagnosis Date  . Anemia   . Anxiety   . Asthma   . Bipolar 1 disorder, depressed 02/01/2016  . Bipolar disorder, rapid cycling 12/18/2015  . Depression   . Gastroparesis   . GERD (gastroesophageal reflux disease)   . Hiatal hernia   . Kidney stones   . Lupus   . Peptic ulcer    Past Surgical History:  Procedure Laterality Date  . CYST EXCISION     EYE  . ESOPHAGOGASTRODUODENOSCOPY     Allergies  Allergen Reactions  . Penicillins Hives, Itching, Nausea And Vomiting and Other (See Comments)    Other reaction(s): Fever Fever, rash and vomiting Fever, rash and vomiting Other reaction(s): Fever Fever, rash and vomiting  . Zoloft [Sertraline] Other (See Comments)    seizures Other reaction(s): Seizures seizures      Objective:    Physical Exam Vitals and nursing note reviewed.  Constitutional:      Appearance: Normal appearance.  Cardiovascular:     Rate and Rhythm: Normal rate and regular rhythm.  Pulmonary:     Effort: Pulmonary effort is normal.     Breath sounds: Normal breath sounds.  Musculoskeletal:        General: Normal range of motion.  Skin:    General: Skin is warm and dry.  Neurological:     Mental Status: She is alert.  Psychiatric:        Mood and Affect: Mood normal.        Behavior: Behavior normal.   BP 120/87 (BP Location: Left Arm, Patient Position: Sitting, Cuff Size: Normal)   Pulse (!) 133   Temp (!) 97.2 F (36.2 C) (Temporal)   Ht 5\' 4"  (1.626 m)   Wt 136 lb 9.6 oz (62 kg)   LMP 02/16/2022 (Exact Date)   SpO2 100%   BMI 23.45 kg/m  Wt Readings from Last 3 Encounters:  05/27/22 136 lb 9.6 oz (62 kg)  05/16/22 132 lb 9.6 oz (60.1 kg)  05/03/22 133 lb 3.2 oz (60.4 kg)       Jeanie Sewer, NP

## 2022-05-28 ENCOUNTER — Encounter: Payer: Self-pay | Admitting: Family

## 2022-05-28 DIAGNOSIS — R000 Tachycardia, unspecified: Secondary | ICD-10-CM

## 2022-05-28 HISTORY — DX: Tachycardia, unspecified: R00.0

## 2022-05-28 NOTE — Assessment & Plan Note (Signed)
New pt reports increased anxiety d/t recent miscarriage pt reports her watch alerts her when HR >140 - has had up to 180 denies palpitations or SOB sending Propranolol 10mg  tid prn, advised on use & SE, can also benefit with anxiety sx f/u 1 month

## 2022-05-30 ENCOUNTER — Encounter (HOSPITAL_BASED_OUTPATIENT_CLINIC_OR_DEPARTMENT_OTHER): Payer: Self-pay | Admitting: Obstetrics & Gynecology

## 2022-05-31 ENCOUNTER — Telehealth (HOSPITAL_BASED_OUTPATIENT_CLINIC_OR_DEPARTMENT_OTHER): Payer: Self-pay | Admitting: Obstetrics & Gynecology

## 2022-05-31 ENCOUNTER — Other Ambulatory Visit (HOSPITAL_BASED_OUTPATIENT_CLINIC_OR_DEPARTMENT_OTHER): Payer: Self-pay | Admitting: Obstetrics & Gynecology

## 2022-05-31 ENCOUNTER — Other Ambulatory Visit (HOSPITAL_BASED_OUTPATIENT_CLINIC_OR_DEPARTMENT_OTHER): Payer: Medicaid Other

## 2022-05-31 ENCOUNTER — Ambulatory Visit (HOSPITAL_BASED_OUTPATIENT_CLINIC_OR_DEPARTMENT_OTHER)
Admission: RE | Admit: 2022-05-31 | Discharge: 2022-05-31 | Disposition: A | Discharge status: Home / Self Care | Payer: Medicaid Other | Source: Ambulatory Visit | Attending: Obstetrics & Gynecology | Admitting: Obstetrics & Gynecology

## 2022-05-31 ENCOUNTER — Other Ambulatory Visit (HOSPITAL_BASED_OUTPATIENT_CLINIC_OR_DEPARTMENT_OTHER): Payer: Self-pay | Admitting: *Deleted

## 2022-05-31 ENCOUNTER — Encounter (HOSPITAL_BASED_OUTPATIENT_CLINIC_OR_DEPARTMENT_OTHER): Payer: Self-pay | Admitting: Obstetrics & Gynecology

## 2022-05-31 ENCOUNTER — Ambulatory Visit (INDEPENDENT_AMBULATORY_CARE_PROVIDER_SITE_OTHER): Payer: Medicaid Other | Admitting: Obstetrics & Gynecology

## 2022-05-31 VITALS — BP 118/70 | Ht 64.0 in | Wt 135.0 lb

## 2022-05-31 DIAGNOSIS — O039 Complete or unspecified spontaneous abortion without complication: Secondary | ICD-10-CM | POA: Diagnosis not present

## 2022-05-31 DIAGNOSIS — N96 Recurrent pregnancy loss: Secondary | ICD-10-CM | POA: Diagnosis not present

## 2022-05-31 DIAGNOSIS — R000 Tachycardia, unspecified: Secondary | ICD-10-CM

## 2022-05-31 DIAGNOSIS — N926 Irregular menstruation, unspecified: Secondary | ICD-10-CM | POA: Insufficient documentation

## 2022-05-31 DIAGNOSIS — Z3A00 Weeks of gestation of pregnancy not specified: Secondary | ICD-10-CM

## 2022-05-31 DIAGNOSIS — O034 Incomplete spontaneous abortion without complication: Secondary | ICD-10-CM | POA: Diagnosis not present

## 2022-05-31 DIAGNOSIS — Z840 Family history of diseases of the skin and subcutaneous tissue: Secondary | ICD-10-CM

## 2022-05-31 DIAGNOSIS — N939 Abnormal uterine and vaginal bleeding, unspecified: Secondary | ICD-10-CM

## 2022-05-31 NOTE — Telephone Encounter (Signed)
Called pt in response to my chart message.  Bleeding is better but was heavier with some clotting this week.  Husband encouraged her to call.  Did take misoprostol for retained POCs.  Offered [facility] to be seen but she would prefer not to do there.  Vaginal ultrasound scheduled with radiology today at 11am.  Pt will come and have lab work for repeat HCG and CBC.  Pt comfortable with plan.

## 2022-06-01 ENCOUNTER — Encounter (HOSPITAL_BASED_OUTPATIENT_CLINIC_OR_DEPARTMENT_OTHER): Payer: Self-pay | Admitting: Obstetrics & Gynecology

## 2022-06-01 DIAGNOSIS — N96 Recurrent pregnancy loss: Secondary | ICD-10-CM | POA: Insufficient documentation

## 2022-06-01 NOTE — Progress Notes (Signed)
GYNECOLOGY  VISIT  CC:   follow up for retained POCs after miscarriage, irregular bleeding  HPI: 32 y.o. [id number] Single White or Caucasian female here for follow up after having ultrasound done today due to continued irregular bleeding with hx of retained POCs after miscarriage.  U/s on 3/28 showed 1.7cm echogenic endometrial finding with vascularity concerning for POCs.  She did ultimately take the 800mcg cytotec dosing.  Bleeding and cramping were heavier but then tapered off and have been much better the past couple of days.  As still irregular, repeat HCG, CBC and ultrasound recommended.  Ultrasound done in radiology just a little while ago.  Has not been unclear.  But I reviewed the imaging and the echogenic lesion is gone.  The endometrium is thin.  It does appear the retained products have been expelled.  Due to the irregular bleeding, progesterone therapy or combination OCP is recommended.  Patient is very nervous about being on a hormonal therapy and declines today.  She did have a normal hemoglobin on 3/8 she is on iron to hopefully this will may normal.  She is also continuing to have tachycardia.  She does feel this is related possibly to her anxiety.  Medication has been recently increased and this has not seem to make any change.  This tachycardia has been going on several weeks and was present when I saw her when here hemoglobin was 12.0 so I think this is not due to anemia.  She is open cardiology referral and wearing a monitor if needed.  She would really like to understand her pulse increases like it does.   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     amphetamine-dextroamphetamine (ADDERALL XR) 20 MG 24 hr capsule Take 1 capsule (20 mg total) by mouth every morning. 30 capsule 0   clonazePAM (KLONOPIN) 0.5 MG tablet Take 1 tablet (0.5 mg total) by mouth 2 (two) times daily as needed for anxiety. 60 tablet 0   ibuprofen (ADVIL) 800 MG tablet Take 1 tablet (800 mg total) by mouth every 8 (eight) hours as needed. 30 tablet 0   omeprazole (PRILOSEC) 20 MG capsule Take 20 mg by mouth daily.     ondansetron (ZOFRAN-ODT) 8 MG disintegrating tablet Take 1 tablet (8 mg total) by mouth every 8 (eight) hours as needed for nausea or vomiting. 15 tablet 0   Prenatal Vit-Fe Fumarate-FA (PRENATAL MULTIVITAMIN) TABS tablet Take 1 tablet by mouth daily at 12 noon.     promethazine (PHENERGAN) 25 MG tablet Take 1 tablet (25 mg total) by mouth every 6 (six) hours as needed for nausea or vomiting. 30 tablet 1   propranolol (INDERAL) 10 MG tablet Take 1 tablet (10 mg total) by mouth 3 (three) times daily as needed (Fast heart rate and anxiety). 60 tablet 1   ranitidine (ZANTAC) 150 MG tablet Take 150 mg by mouth daily.  VENTOLIN HFA 108 (90 Base) MCG/ACT inhaler Inhale 1 puff into the lungs as needed.     No current facility-administered medications on file prior to visit.    ALLERGIES: Penicillins and Zoloft [sertraline]  SH:  married, non smoker  Review of Systems  Constitutional: Negative.   Cardiovascular:        Increased heard rate  Genitourinary:        Cramping Irregular bleeding    PHYSICAL EXAMINATION:    BP 118/70 (BP Location: Right Arm, Patient Position: Sitting, Cuff Size: Normal)   Ht 5\' 4"  (1.626 m)   Wt 135 lb (61.2 kg)   LMP 02/16/2022 (Exact Date)   BMI 23.17 kg/m     General appearance: alert, cooperative and appears stated age CV:  elevated rate, no murmurs, regular  rhythm Lungs:  clear to auscultation, no wheezes, rales or rhonchi, symmetric air entry  Assessment/Plan: 1. Retained products of conception after miscarriage - will await formal reading but it does look like she has passed the remaining tissue  2. Tachycardia - AMB Referral to Cardio Obstetrics  3. Abnormal vaginal bleeding - repeat HCG and CBC obtained today - reasons to be seen emergently discussed with pt today  4.  H/o recurrent miscarriages - antiphospholipid ab and lupus anticoagulant testing was drawn with labs to day.  This has been done as a future lab and we were waiting until issues with this pregnancy resolved.  Checked with phlebotomy and blood has already gone out so cannot stop processing.  5.  Recurrent miscarriages - lab work pending for initial evaluation as well

## 2022-06-02 ENCOUNTER — Other Ambulatory Visit: Payer: Self-pay

## 2022-06-02 ENCOUNTER — Observation Stay (HOSPITAL_COMMUNITY)
Admission: AD | Admit: 2022-06-02 | Discharge: 2022-06-03 | Disposition: A | Discharge status: Home / Self Care | Payer: Medicaid Other | Attending: Obstetrics and Gynecology | Admitting: Obstetrics and Gynecology

## 2022-06-02 DIAGNOSIS — Z79899 Other long term (current) drug therapy: Secondary | ICD-10-CM | POA: Insufficient documentation

## 2022-06-02 DIAGNOSIS — J45909 Unspecified asthma, uncomplicated: Secondary | ICD-10-CM | POA: Insufficient documentation

## 2022-06-02 DIAGNOSIS — Z87891 Personal history of nicotine dependence: Secondary | ICD-10-CM | POA: Insufficient documentation

## 2022-06-02 DIAGNOSIS — D649 Anemia, unspecified: Secondary | ICD-10-CM | POA: Diagnosis not present

## 2022-06-02 DIAGNOSIS — R42 Dizziness and giddiness: Secondary | ICD-10-CM | POA: Diagnosis present

## 2022-06-02 DIAGNOSIS — G43909 Migraine, unspecified, not intractable, without status migrainosus: Secondary | ICD-10-CM | POA: Insufficient documentation

## 2022-06-02 DIAGNOSIS — K3184 Gastroparesis: Secondary | ICD-10-CM

## 2022-06-02 LAB — COMPREHENSIVE METABOLIC PANEL
ALT: 13 U/L (ref 0–44)
AST: 17 U/L (ref 15–41)
Albumin: 3.9 g/dL (ref 3.5–5.0)
Alkaline Phosphatase: 37 U/L — ABNORMAL LOW (ref 38–126)
Anion gap: 9 (ref 5–15)
BUN: 10 mg/dL (ref 6–20)
CO2: 25 mmol/L (ref 22–32)
Calcium: 9.1 mg/dL (ref 8.9–10.3)
Chloride: 105 mmol/L (ref 98–111)
Creatinine, Ser: 0.83 mg/dL (ref 0.44–1.00)
GFR, Estimated: >60 mL/min (ref >60)
Glucose, Bld: 99 mg/dL (ref 70–99)
Potassium: 3.9 mmol/L (ref 3.5–5.1)
Sodium: 139 mmol/L (ref 135–145)
Total Bilirubin: 0.4 mg/dL (ref 0.3–1.2)
Total Protein: 6.7 g/dL (ref 6.5–8.1)

## 2022-06-02 LAB — CBC
HCT: 24.8 % — ABNORMAL LOW (ref 36.0–46.0)
Hemoglobin: 8.7 g/dL — ABNORMAL LOW (ref 12.0–15.0)
MCH: 33.1 pg (ref 26.0–34.0)
MCHC: 35.1 g/dL (ref 30.0–36.0)
MCV: 94.3 fL (ref 80.0–100.0)
Platelets: 217 10*3/uL (ref 150–400)
RBC: 2.63 MIL/uL — ABNORMAL LOW (ref 3.87–5.11)
RDW: 12.8 % (ref 11.5–15.5)
WBC: 4 10*3/uL (ref 4.0–10.5)
nRBC: 0 % (ref 0.0–0.2)

## 2022-06-02 LAB — PREPARE RBC (CROSSMATCH)

## 2022-06-02 LAB — TYPE AND SCREEN: ABO/RH(D): O POS

## 2022-06-02 LAB — POCT PREGNANCY, URINE: Preg Test, Ur: NEGATIVE

## 2022-06-02 LAB — MAGNESIUM: Magnesium: 1.9 mg/dL (ref 1.7–2.4)

## 2022-06-02 MED ORDER — PROPRANOLOL HCL 10 MG PO TABS: 10.0000 mg | ORAL_TABLET | Freq: Three times a day (TID) | ORAL | Status: DC | PRN

## 2022-06-02 MED ORDER — TRANEXAMIC ACID 650 MG PO TABS
1300.0000 mg | ORAL_TABLET | Freq: Three times a day (TID) | ORAL | Status: DC
  Administered 2022-06-02 – 2022-06-03 (×2): 1300 mg via ORAL
  Filled 2022-06-02 (×2): qty 2

## 2022-06-02 MED ORDER — CLONAZEPAM 0.5 MG PO TABS: 0.5000 mg | ORAL_TABLET | Freq: Two times a day (BID) | ORAL | Status: DC | PRN

## 2022-06-02 MED ORDER — LACTATED RINGERS IV BOLUS
1000.0000 mL | Freq: Once | INTRAVENOUS | Status: AC
  Administered 2022-06-02: 1000 mL via INTRAVENOUS

## 2022-06-02 MED ORDER — SODIUM CHLORIDE 0.9 % IV SOLN: INTRAVENOUS | Status: DC

## 2022-06-02 MED ORDER — ONDANSETRON HCL 4 MG PO TABS: 4.0000 mg | ORAL_TABLET | Freq: Four times a day (QID) | ORAL | Status: DC | PRN

## 2022-06-02 MED ORDER — ONDANSETRON HCL 4 MG/2ML IJ SOLN
4.0000 mg | Freq: Once | INTRAMUSCULAR | Status: AC
  Administered 2022-06-02: 4 mg via INTRAVENOUS
  Filled 2022-06-02: qty 2

## 2022-06-02 MED ORDER — PANTOPRAZOLE SODIUM 40 MG PO TBEC
40.0000 mg | DELAYED_RELEASE_TABLET | Freq: Every day | ORAL | Status: DC
  Administered 2022-06-02 – 2022-06-03 (×2): 40 mg via ORAL
  Filled 2022-06-02 (×2): qty 1

## 2022-06-02 MED ORDER — PRENATAL MULTIVITAMIN CH: 1.0000 | ORAL_TABLET | Freq: Every day | ORAL | Status: DC

## 2022-06-02 MED ORDER — ONDANSETRON HCL 4 MG/2ML IJ SOLN
4.0000 mg | Freq: Four times a day (QID) | INTRAMUSCULAR | Status: DC | PRN
  Administered 2022-06-03 (×2): 4 mg via INTRAVENOUS
  Filled 2022-06-02 (×2): qty 2

## 2022-06-02 MED ORDER — IBUPROFEN 600 MG PO TABS
600.0000 mg | ORAL_TABLET | Freq: Four times a day (QID) | ORAL | Status: DC | PRN
  Administered 2022-06-02: 600 mg via ORAL
  Filled 2022-06-02 (×2): qty 1

## 2022-06-02 MED ORDER — SODIUM CHLORIDE 0.9 % IV SOLN
510.0000 mg | Freq: Once | INTRAVENOUS | Status: AC
  Administered 2022-06-02: 510 mg via INTRAVENOUS
  Filled 2022-06-02: qty 17

## 2022-06-02 MED ORDER — KETOROLAC TROMETHAMINE 30 MG/ML IJ SOLN
30.0000 mg | Freq: Once | INTRAMUSCULAR | Status: AC
  Administered 2022-06-02: 30 mg via INTRAVENOUS
  Filled 2022-06-02: qty 1

## 2022-06-02 MED ORDER — SODIUM CHLORIDE 0.9% IV SOLUTION: Freq: Once | INTRAVENOUS | Status: AC

## 2022-06-02 MED ORDER — CLONAZEPAM 1 MG PO TABS
1.0000 mg | ORAL_TABLET | Freq: Two times a day (BID) | ORAL | Status: DC | PRN
  Administered 2022-06-02: 1 mg via ORAL
  Filled 2022-06-02: qty 1

## 2022-06-02 MED ORDER — AMPHETAMINE-DEXTROAMPHET ER 10 MG PO CP24: 20.0000 mg | ORAL_CAPSULE | ORAL | Status: DC

## 2022-06-02 NOTE — MAU Provider Note (Signed)
History     CSN: [id number]  Arrival date and time: 06/02/22 1100   Event Date/Time   First Provider Initiated Contact with Patient 06/02/22 1158      Chief Complaint  Patient presents with   Dizziness   Leg Pain   Vaginal Bleeding   HPI Meagan Hunter is a nonpregnant 32 y.o. [id number] who presents to MAU for worsening heavy vaginal bleeding and dizziness. Patient was diagnosed with a missed AB on 3/5. She subsequently had retained POC's in which she took Cytotec approximately 2 weeks ago. She reports she has had heavy vaginal bleeding since then that comes and goes. She reports bleeding has been heavy to the point of having to change her pad every hour on it's heaviest days as well as passing large fist-sized clots. She had a normal pelvic ultrasound on 4/5. She reports today her bleeding has "lightened up".  She is here today because she has had ongoing dizziness and an increased heart rate that worsened yesterday. Her symptoms started prior to taking Cytotec and her OBGYN placed a cardiology referral. She reports symptoms have gotten so bad over the weekend that it makes it difficult to go up a flight of stairs or hold her child. When she stands up she "has a whooshing sound in her head" followed but her she can feel her heart rate increase, her legs start to hurt and then feel numb and weak. She reports heart heart rate has gotten as high as 186. She reports at times her chest feels tight and as if she cannot catch her breath.   She reports poor appetite and nausea. She reports she has not had much of an appetite over the past several days but has tolerated small amounts of food/fluids.   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    Past Surgical History:  Procedure Laterality Date   CYST EXCISION     EYE   ESOPHAGOGASTRODUODENOSCOPY      Family History  Problem Relation Age of Onset   Hypertension Mother    Ovarian cancer Mother    Thyroid cancer Mother    Crohn's disease Mother    Hypertension Father    Hyperlipidemia Father    Heart failure Father    Hashimoto's thyroiditis Sister    Breast cancer Maternal Aunt    Heart failure Maternal Grandfather    Heart failure Paternal Grandfather    Colon cancer Paternal Grandfather     Social History   Tobacco Use   Smoking status: Former    Types: Cigarettes    Quit date: 02/26/2015    Years since quitting: 7.2   Smokeless tobacco: Never  Vaping Use  Vaping Use: Never used  Substance Use Topics   Alcohol use: Not Currently    Alcohol/week: 2.0 standard drinks of alcohol    Types: 2 Standard drinks or equivalent per week    Comment: 3 per week   Drug use: Never    Allergies:  Allergies  Allergen Reactions   Penicillins Hives, Itching, Nausea And Vomiting and Other (See Comments)    Other reaction(s): Fever Fever, rash and vomiting Fever, rash and vomiting Other reaction(s): Fever Fever, rash and vomiting   Zoloft [Sertraline] Other (See Comments)    seizures Other reaction(s): Seizures seizures    Medications Prior to Admission  Medication Sig Dispense Refill Last Dose   acetaminophen (TYLENOL) 500 MG tablet Take 500-1,000 mg by mouth 2 (two) times daily.      amphetamine-dextroamphetamine (ADDERALL XR) 20 MG 24 hr capsule Take 1 capsule (20 mg total) by mouth every morning. 30 capsule 0    clonazePAM (KLONOPIN) 0.5 MG tablet Take 1 tablet (0.5 mg total) by mouth 2 (two) times daily as needed for anxiety. 60 tablet 0     ibuprofen (ADVIL) 800 MG tablet Take 1 tablet (800 mg total) by mouth every 8 (eight) hours as needed. 30 tablet 0    omeprazole (PRILOSEC) 20 MG capsule Take 20 mg by mouth daily.      ondansetron (ZOFRAN-ODT) 8 MG disintegrating tablet Take 1 tablet (8 mg total) by mouth every 8 (eight) hours as needed for nausea or vomiting. 15 tablet 0    Prenatal Vit-Fe Fumarate-FA (PRENATAL MULTIVITAMIN) TABS tablet Take 1 tablet by mouth daily at 12 noon.      promethazine (PHENERGAN) 25 MG tablet Take 1 tablet (25 mg total) by mouth every 6 (six) hours as needed for nausea or vomiting. 30 tablet 1    propranolol (INDERAL) 10 MG tablet Take 1 tablet (10 mg total) by mouth 3 (three) times daily as needed (Fast heart rate and anxiety). 60 tablet 1    ranitidine (ZANTAC) 150 MG tablet Take 150 mg by mouth daily.      VENTOLIN HFA 108 (90 Base) MCG/ACT inhaler Inhale 1 puff into the lungs as needed.      Review of Systems  Constitutional:  Positive for appetite change.  Respiratory:  Positive for chest tightness and shortness of breath.   Cardiovascular:  Positive for palpitations.  Gastrointestinal:  Positive for nausea.  Genitourinary:  Positive for vaginal bleeding.  Neurological:  Positive for dizziness, light-headedness and headaches.  Psychiatric/Behavioral:  The patient is nervous/anxious.    Physical Exam  Patient Vitals for the past 24 hrs:  BP Temp Temp src Pulse Resp SpO2  06/02/22 1540 113/74 -- -- 90 -- --  06/02/22 1535 112/68 -- -- 88 -- --  06/02/22 1505 109/64 -- -- 88 -- --  06/02/22 1500 106/69 -- -- 85 -- 100 %  06/02/22 1455 109/63 -- -- 88 -- --  06/02/22 1450 114/62 -- -- 79 -- --  06/02/22 1320 110/81 -- -- (!) 131 -- --  06/02/22 1316 126/75 -- -- (!) 126 -- --  06/02/22 1315 121/71 -- -- 99 -- --  06/02/22 1313 126/66 -- -- 89 -- --  06/02/22 1125 100/66 98.9 F (37.2 C) Oral (!) 123 15 --   Physical Exam Vitals and nursing note reviewed. Exam conducted with a  chaperone present.  Constitutional:      General: She is not in acute distress. Eyes:     Extraocular Movements: Extraocular movements  intact.     Pupils: Pupils are equal, round, and reactive to light.  Cardiovascular:     Rate and Rhythm: Regular rhythm. Tachycardia present.     Heart sounds: Normal heart sounds.  Pulmonary:     Effort: Pulmonary effort is normal. No respiratory distress.     Breath sounds: Normal breath sounds.  Abdominal:     Palpations: Abdomen is soft.     Tenderness: There is abdominal tenderness in the periumbilical area and suprapubic area. There is no guarding or rebound.  Genitourinary:    Comments: Normal external female genitalia, vaginal walls pink and well-rugated, small amount of dark red blood removed with 1 fox swab, cervix visually closed without lesions/masses Musculoskeletal:        General: Normal range of motion.     Cervical back: Normal range of motion.  Skin:    General: Skin is warm and dry.     Coloration: Skin is pale.  Neurological:     General: No focal deficit present.     Mental Status: She is alert and oriented to person, place, and time.     Cranial Nerves: Cranial nerves 2-12 are intact.  Psychiatric:        Mood and Affect: Mood normal.        Behavior: Behavior normal.    Results for orders placed or performed during the hospital encounter of 06/02/22 (from the past 24 hour(s))  Pregnancy, urine POC     Status: None   Collection Time: 06/02/22 11:50 AM  Result Value Ref Range   Preg Test, Ur NEGATIVE NEGATIVE  CBC     Status: Abnormal   Collection Time: 06/02/22 12:15 PM  Result Value Ref Range   WBC 4.0 4.0 - 10.5 K/uL   RBC 2.63 (L) 3.87 - 5.11 MIL/uL   Hemoglobin 8.7 (L) 12.0 - 15.0 g/dL   HCT 87.5 (L) 64.3 - 32.9 %   MCV 94.3 80.0 - 100.0 fL   MCH 33.1 26.0 - 34.0 pg   MCHC 35.1 30.0 - 36.0 g/dL   RDW 51.8 84.1 - 66.0 %   Platelets 217 150 - 400 K/uL   nRBC 0.0 0.0 - 0.2 %  Comprehensive metabolic panel      Status: Abnormal   Collection Time: 06/02/22  2:22 PM  Result Value Ref Range   Sodium 139 135 - 145 mmol/L   Potassium 3.9 3.5 - 5.1 mmol/L   Chloride 105 98 - 111 mmol/L   CO2 25 22 - 32 mmol/L   Glucose, Bld 99 70 - 99 mg/dL   BUN 10 6 - 20 mg/dL   Creatinine, Ser 6.30 0.44 - 1.00 mg/dL   Calcium 9.1 8.9 - 16.0 mg/dL   Total Protein 6.7 6.5 - 8.1 g/dL   Albumin 3.9 3.5 - 5.0 g/dL   AST 17 15 - 41 U/L   ALT 13 0 - 44 U/L   Alkaline Phosphatase 37 (L) 38 - 126 U/L   Total Bilirubin 0.4 0.3 - 1.2 mg/dL   GFR, Estimated >10 >93 mL/min   Anion gap 9 5 - 15  Magnesium     Status: None   Collection Time: 06/02/22  2:22 PM  Result Value Ref Range   Magnesium 1.9 1.7 - 2.4 mg/dL  Prepare RBC (crossmatch)     Status: None   Collection Time: 06/02/22  7:40 PM  Result Value Ref Range   Order Confirmation      ORDER PROCESSED BY BLOOD  BANK Performed at [facility], [address] [place], [place] [address]   Type and screen [facility]     Status: None (Preliminary result)   Collection Time: 06/02/22  7:40 PM  Result Value Ref Range   ABO/RH(D) PENDING    Antibody Screen PENDING    Sample Expiration      06/05/2022,2359 Performed at [facility], [address] [place], [place] [address]     MAU Course  Procedures  MDM UPT CBC, CMP, Magnesium EKG Orthostatic BP's  2L IVF's  IV Fereheme  UPT negative. EKG normal. Vitals stable on arrival. Patient is orthostatic. Orthostatics performed with significant increase in heart rate upon standing. HR increased to 130s-140s and patient became very dizzy. Bleeding minimal upon exam. CBC shows anemia with hgb of 8.7.   C/w Dr. Earlene Plateravis to discuss possibility of blood transfusion given patient being symptomatic vs IV iron. Will give a dose of IV Fereheme. CMP and magnesium level added on to labs. Patient was also given a dose of Toradol and Zofran. She did tolerate soup after receiving anti-emetics.   On  reassessment after patient receiving IV Netta CedarsFereheme, RN to have patient ambulate. However patient was unable to tolerate standing as she was still symptomatic and dizzy. CMP and magnesium level normal. Dr. Earlene Plateravis updated on patient. Will give second liter of IVF's and [facility]u pRBC's for symptomatic anemia. Patient notified and agrees with plan of care.   Assessment and Plan  Symptomatic anemia  - Admit to [facility] - Orders placed by Dr. Arlana Pouchavis  Mone Commisso L Shaquaya Wuellner, CNM 06/02/2022, 8:56 PM

## 2022-06-02 NOTE — H&P (Signed)
OB/GYN History and Physical  Meagan Hunter is a 32 y.o. [id number] presenting for severely worsening light-headedness and dizziness. She has had these symptoms for the last month, but they have progressed to the point this weekend that she cannot stand up. She had a miscarriage about a month ago and took cytotec two weeks ago for retained products. She had an ultrasound 2 days ago that showed no retained products. She reports her light-headedness and dizziness has increased over the course of the month but got acutely worse yesterday, which is why she is at MAU today. She has continued to bleed this month, reporting fist sized clots regularly with soaking a pad an hour at times. She reports over the last day or so, she is having some smaller clots but will with huge clots.  She does report poor appetite with ongoing nausea and vomiting things she tries to eat over the last few days. She is not drinking much either. She was able to tolerate soup her with anti-emetics on board.       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    Past Surgical History:  Procedure Laterality Date   CYST EXCISION     EYE   ESOPHAGOGASTRODUODENOSCOPY      OB History  Gravida Para Term Preterm AB Living  5 3 3   2 3   SAB IAB Ectopic Multiple Live Births  2 0 0   3    # Outcome Date GA Lbr Len/2nd Weight Sex Delivery Anes PTL Lv  5 SAB 04/2022          4 Term 12/13/20   3350 g M Vag-Spont   LIV  3 Term 08/08/14   3450 g M Vag-Spont   LIV  2 Term 01/14/11   3175 g F Vag-Forceps   LIV  1 SAB             Social History   Socioeconomic History   Marital status: Single    Spouse name: Not on file   Number of  children: 3   Years of education: Not on file   Highest education level: 12th grade  Occupational History   Occupation: stay at home mom  Tobacco Use   Smoking status: Former    Types: Cigarettes    Quit date: 02/26/2015    Years since quitting: 7.2   Smokeless tobacco: Never  Vaping Use   Vaping Use: Never used  Substance and Sexual Activity   Alcohol use: Not Currently    Alcohol/week: 2.0 standard drinks of alcohol    Types: 2 Standard drinks or equivalent per week    Comment: 3 per week   Drug use: Never   Sexual activity: Not Currently    Birth control/protection: None  Other Topics Concern   Not on file  Social History Narrative   Not on file   Social Determinants of Health   Financial Resource Strain: Low Risk  (05/26/2022)   Overall Financial Resource Strain (CARDIA)    Difficulty of Paying Living Expenses: Not hard at all  Food Insecurity: No Food Insecurity (05/26/2022)   Hunger  Vital Sign    Worried About Programme researcher, broadcasting/film/videounning Out of Food in the Last Year: Never true    Ran Out of Food in the Last Year: Never true  Transportation Needs: No Transportation Needs (05/26/2022)   PRAPARE - Administrator, Civil ServiceTransportation    Lack of Transportation (Medical): No    Lack of Transportation (Non-Medical): No  Physical Activity: Inactive (05/26/2022)   Exercise Vital Sign    Days of Exercise per Week: 0 days    Minutes of Exercise per Session: 20 min  Stress: Stress Concern Present (05/26/2022)   [employer] - Occupational Stress Questionnaire    Feeling of Stress : Very much  Social Connections: Unknown (05/26/2022)   Social Connection and Isolation Panel [NHANES]    Frequency of Communication with Friends and Family: Once a week    Frequency of Social Gatherings with Friends and Family: Once a week    Attends Religious Services: Patient declined    Database administratorActive Member of Clubs or Organizations: Yes    Attends Engineer, structuralClub or Organization Meetings: More than 4 times per year    Marital  Status: Living with partner  Recent Concern: Social Connections - Socially Isolated (04/17/2022)   Social Connection and Isolation Panel [NHANES]    Frequency of Communication with Friends and Family: More than three times a week    Frequency of Social Gatherings with Friends and Family: Once a week    Attends Religious Services: Never    Database administratorActive Member of Clubs or Organizations: No    Attends Engineer, structuralClub or Organization Meetings: Never    Marital Status: Divorced    Family History  Problem Relation Age of Onset   Hypertension Mother    Ovarian cancer Mother    Thyroid cancer Mother    Crohn's disease Mother    Hypertension Father    Hyperlipidemia Father    Heart failure Father    Hashimoto's thyroiditis Sister    Breast cancer Maternal Aunt    Heart failure Maternal Grandfather    Heart failure Paternal Grandfather    Colon cancer Paternal Grandfather     Medications Prior to Admission  Medication Sig Dispense Refill Last Dose   acetaminophen (TYLENOL) 500 MG tablet Take 500-1,000 mg by mouth 2 (two) times daily.      amphetamine-dextroamphetamine (ADDERALL XR) 20 MG 24 hr capsule Take 1 capsule (20 mg total) by mouth every morning. 30 capsule 0    clonazePAM (KLONOPIN) 0.5 MG tablet Take 1 tablet (0.5 mg total) by mouth 2 (two) times daily as needed for anxiety. 60 tablet 0    ibuprofen (ADVIL) 800 MG tablet Take 1 tablet (800 mg total) by mouth every 8 (eight) hours as needed. 30 tablet 0    omeprazole (PRILOSEC) 20 MG capsule Take 20 mg by mouth daily.      ondansetron (ZOFRAN-ODT) 8 MG disintegrating tablet Take 1 tablet (8 mg total) by mouth every 8 (eight) hours as needed for nausea or vomiting. 15 tablet 0    Prenatal Vit-Fe Fumarate-FA (PRENATAL MULTIVITAMIN) TABS tablet Take 1 tablet by mouth daily at 12 noon.      promethazine (PHENERGAN) 25 MG tablet Take 1 tablet (25 mg total) by mouth every 6 (six) hours as needed for nausea or vomiting. 30 tablet 1    propranolol (INDERAL)  10 MG tablet Take 1 tablet (10 mg total) by mouth 3 (three) times daily as needed (Fast heart rate and anxiety). 60 tablet 1    ranitidine (ZANTAC) 150 MG  tablet Take 150 mg by mouth daily.      VENTOLIN HFA 108 (90 Base) MCG/ACT inhaler Inhale 1 puff into the lungs as needed.       Allergies  Allergen Reactions   Penicillins Hives, Itching, Nausea And Vomiting and Other (See Comments)    Other reaction(s): Fever Fever, rash and vomiting Fever, rash and vomiting Other reaction(s): Fever Fever, rash and vomiting   Zoloft [Sertraline] Other (See Comments)    seizures Other reaction(s): Seizures seizures    Review of Systems: Negative except for what is mentioned in HPI.     Physical Exam: BP 113/74   Pulse 90   Temp 98.9 F (37.2 C) (Oral)   Resp 15   SpO2 100%  CONSTITUTIONAL: Well-developed, well-nourished female in mild distress.  HENT:  Normocephalic, atraumatic, External right and left ear normal. Oropharynx is clear and moist EYES: Conjunctivae and EOM are normal. Pupils are equal, round, and reactive to light. No scleral icterus.  NECK: Normal range of motion, supple, no masses SKIN: Skin is warm and dry. No rash noted. Not diaphoretic. No erythema. Sppears pale NEUROLGIC: Alert and oriented to person, place, and time. Normal reflexes, muscle tone coordination. No cranial nerve deficit noted. PSYCHIATRIC: Normal mood and affect. Normal behavior. Normal judgment and thought content. CARDIOVASCULAR: Normal heart rate noted, regular rhythm however when patient stands, HR jumps 130s-140s RESPIRATORY: Effort normal, no problems with respiration noted ABDOMEN: Soft, very mildly tender over uterus. PELVIC: Deferred MUSCULOSKELETAL: Normal range of motion. No edema and no tenderness. 2+ distal pulses.   Pertinent Labs/Studies:   Results for orders placed or performed during the hospital encounter of 06/02/22 (from the past 72 hour(s))  Pregnancy, urine POC     Status: None    Collection Time: 06/02/22 11:50 AM  Result Value Ref Range   Preg Test, Ur NEGATIVE NEGATIVE    Comment:        THE SENSITIVITY OF THIS METHODOLOGY IS >24 mIU/mL   CBC     Status: Abnormal   Collection Time: 06/02/22 12:15 PM  Result Value Ref Range   WBC 4.0 4.0 - 10.5 K/uL   RBC 2.63 (L) 3.87 - 5.11 MIL/uL   Hemoglobin 8.7 (L) 12.0 - 15.0 g/dL   HCT 68.0 (L) 32.1 - 22.4 %   MCV 94.3 80.0 - 100.0 fL   MCH 33.1 26.0 - 34.0 pg   MCHC 35.1 30.0 - 36.0 g/dL   RDW 82.5 00.3 - 70.4 %   Platelets 217 150 - 400 K/uL   nRBC 0.0 0.0 - 0.2 %    Comment: Performed at [facility], [address], [place], [place] [address]  Comprehensive metabolic panel     Status: Abnormal   Collection Time: 06/02/22  2:22 PM  Result Value Ref Range   Sodium 139 135 - 145 mmol/L   Potassium 3.9 3.5 - 5.1 mmol/L   Chloride 105 98 - 111 mmol/L   CO2 25 22 - 32 mmol/L   Glucose, Bld 99 70 - 99 mg/dL    Comment: Glucose reference range applies only to samples taken after fasting for at least 8 hours.   BUN 10 6 - 20 mg/dL   Creatinine, Ser 6.94 0.44 - 1.00 mg/dL   Calcium 9.1 8.9 - 50.3 mg/dL   Total Protein 6.7 6.5 - 8.1 g/dL   Albumin 3.9 3.5 - 5.0 g/dL   AST 17 15 - 41 U/L   ALT 13 0 - 44  U/L   Alkaline Phosphatase 37 (L) 38 - 126 U/L   Total Bilirubin 0.4 0.3 - 1.2 mg/dL   GFR, Estimated >16 >10 mL/min    Comment: (NOTE) Calculated using the CKD-EPI Creatinine Equation (2021)    Anion gap 9 5 - 15    Comment: Performed at [facility], [address] [place], [place] [address]  Magnesium     Status: None   Collection Time: 06/02/22  2:22 PM  Result Value Ref Range   Magnesium 1.9 1.7 - 2.4 mg/dL    Comment: Performed at [facility], [address] [place], [place] [address]       Assessment and Plan :Jayley Hustead is a 32 y.o. [phone] admitted for symptomatic blood loss anemia secondary to miscarriage. She has had worsening symptoms over the last month to the point  today that she cannot stand at all. Given IVFs and PO intake with antimemetics on board but that has not improved her symptoms. No electrolyte abnormalities. She is orthostatic with heart rate jumping to 130s-140s as soon as she stands, and only feels somewhat better with laying down. She does have a history of intermittent tachycardia, for which she has a cardiology consult pending, however the inability to stand up due to light-headedness is new.  Her Hgb dropped from 13 to 8.7 over the course of a month. While patients are typically not symptomatic at 8.7, she is orthostatic and displaying symptomatic anemia with no other clear etiology. No retained products and no signs of infection s/p SAB. Will plan for transfusion of 1 unit pRBCs, start Lysteda for bleeding as she does not want to use anything hormonal and see how she feels post transfuion.  Transfuse 1 unit of blood Diet as tolerated Home meds ordered VS per unit routine May need further workup if continues   K. Therese Sarah, M.D. Attending Obstetrician & Gynecologist, [facility] for [employer], [facility]

## 2022-06-02 NOTE — MAU Note (Signed)
.  Meagan Hunter is a 32 y.o. post SAB here in MAU reporting: heavy bleeding for the past month that has now lightened.  Pt reports she has had a rapid HR since the SAB and often feels dizzy and weak.  Reports her legs feel heavy and tingly and states she can't climb her stairs.  Denies leg pain at this time.  States she was seen in office on Friday and given referral for cardiologist.    Onset of complaint: 1 month  Pain score: 0 Vitals:   06/02/22 1125  BP: 100/66  Pulse: (!) 123  Resp: 15  Temp: 98.9 F (37.2 C)      Lab orders placed from triage:

## 2022-06-03 ENCOUNTER — Other Ambulatory Visit (HOSPITAL_BASED_OUTPATIENT_CLINIC_OR_DEPARTMENT_OTHER): Payer: Medicaid Other

## 2022-06-03 DIAGNOSIS — D649 Anemia, unspecified: Secondary | ICD-10-CM | POA: Diagnosis not present

## 2022-06-03 LAB — BPAM RBC
Blood Product Expiration Date: 202405052359
ISSUE DATE / TIME: 202404072125
Unit Type and Rh: 5100

## 2022-06-03 LAB — TYPE AND SCREEN
Antibody Screen: NEGATIVE
Unit division: 0

## 2022-06-03 LAB — CBC
HCT: 26.2 % — ABNORMAL LOW (ref 36.0–46.0)
Hemoglobin: 8.8 g/dL — ABNORMAL LOW (ref 12.0–15.0)
MCH: 31.5 pg (ref 26.0–34.0)
MCHC: 33.6 g/dL (ref 30.0–36.0)
MCV: 93.9 fL (ref 80.0–100.0)
Platelets: 153 10*3/uL (ref 150–400)
RBC: 2.79 MIL/uL — ABNORMAL LOW (ref 3.87–5.11)
RDW: 12.9 % (ref 11.5–15.5)
WBC: 5.8 10*3/uL (ref 4.0–10.5)
nRBC: 0 % (ref 0.0–0.2)

## 2022-06-03 MED ORDER — SUMATRIPTAN SUCCINATE 50 MG PO TABS: 50.0000 mg | ORAL_TABLET | ORAL | 0 refills | Status: DC | PRN

## 2022-06-03 MED ORDER — FERROUS SULFATE 325 (65 FE) MG PO TABS: 325.0000 mg | ORAL_TABLET | ORAL | 3 refills | Status: DC

## 2022-06-03 MED ORDER — ONDANSETRON HCL 4 MG PO TABS: 4.0000 mg | ORAL_TABLET | Freq: Four times a day (QID) | ORAL | 0 refills | Status: DC | PRN

## 2022-06-03 MED ORDER — SUMATRIPTAN SUCCINATE 50 MG PO TABS
50.0000 mg | ORAL_TABLET | ORAL | Status: DC | PRN
  Filled 2022-06-03: qty 1

## 2022-06-03 MED ORDER — KETOROLAC TROMETHAMINE 30 MG/ML IJ SOLN
30.0000 mg | Freq: Once | INTRAMUSCULAR | Status: AC
  Administered 2022-06-03: 30 mg via INTRAVENOUS
  Filled 2022-06-03: qty 1

## 2022-06-03 MED ORDER — ONDANSETRON 8 MG PO TBDP
8.0000 mg | ORAL_TABLET | Freq: Three times a day (TID) | ORAL | 0 refills | Status: DC | PRN
Start: 2022-06-03 — End: 2022-06-28

## 2022-06-03 NOTE — Discharge Summary (Addendum)
Physician Discharge Summary  Patient ID: Meagan Hunter MRN: [id number] DOB/AGE: 1991/02/01 32 y.o.  Admit date: 06/02/2022 Discharge date: 06/03/2022  Admission Diagnoses:  symptomatic anemia  Discharge Diagnoses:  Principal Problem:   Symptomatic anemia Migraine headache  Discharged Condition: good  Hospital Course: Pt was admitted with symptomatic anemia after remote treatment of SAB.  She was started on oral TXA to aid in decreasing bleeding which was effective.  Pt did have symptomatic anemia and was found to have a hemoglobin of 8.  Pt received one unit of PRBC and states she is symptomatically better.  She did have a migraine this morning which was treated with imitrex.  Since she felt better patient was discharged home on oral iron on 06/03/22.  Consults: None  Significant Diagnostic Studies: labs: cbc  Treatments: blood transfusion  Discharge Exam: Blood pressure 107/63, pulse 83, temperature 98.4 F (36.9 C), temperature source Oral, resp. rate 16, SpO2 94 %, not currently breastfeeding. General appearance: alert, cooperative, and no distress Head: Normocephalic, without obvious abnormality, atraumatic Resp: clear to auscultation bilaterally Cardio: regular rate and rhythm GI: soft, non-tender; bowel sounds normal; no masses,  no organomegaly Extremities: Homans sign is negative, no sign of DVT  Disposition: Discharge disposition: 01-Home or Self Care       Discharge Instructions     Call MD for:  extreme fatigue   Complete by: As directed    Call MD for:  persistant dizziness or light-headedness   Complete by: As directed    Call MD for:  persistant nausea and vomiting   Complete by: As directed    Call MD for:  temperature >100.4   Complete by: As directed    Diet general   Complete by: As directed    Increase activity slowly   Complete by: As directed    Sexual Activity Restrictions   Complete by: As directed    Pelvic rest 2 weeks      Allergies as  of 06/03/2022       Reactions   Penicillins Hives, Itching, Nausea And Vomiting, Other (See Comments)   Other reaction(s): Fever Fever, rash and vomiting Fever, rash and vomiting Other reaction(s): Fever Fever, rash and vomiting   Zoloft [sertraline] Other (See Comments)   seizures Other reaction(s): Seizures seizures        Medication List     STOP taking these medications    prenatal multivitamin Tabs tablet   promethazine 25 MG tablet Commonly known as: PHENERGAN       TAKE these medications    acetaminophen 500 MG tablet Commonly known as: TYLENOL Take 500-1,000 mg by mouth 2 (two) times daily.   amphetamine-dextroamphetamine 20 MG 24 hr capsule Commonly known as: Adderall XR Take 1 capsule (20 mg total) by mouth every morning.   clonazePAM 0.5 MG tablet Commonly known as: KLONOPIN Take 1 tablet (0.5 mg total) by mouth 2 (two) times daily as needed for anxiety.   ferrous sulfate 325 (65 FE) MG tablet Take 1 tablet (325 mg total) by mouth every other day.   ibuprofen 800 MG tablet Commonly known as: ADVIL Take 1 tablet (800 mg total) by mouth every 8 (eight) hours as needed.   omeprazole 20 MG capsule Commonly known as: PRILOSEC Take 20 mg by mouth daily.   ondansetron 4 MG tablet Commonly known as: ZOFRAN Take 1 tablet (4 mg total) by mouth every 6 (six) hours as needed for nausea.   ondansetron 8 MG disintegrating tablet Commonly known  as: ZOFRAN-ODT Take 1 tablet (8 mg total) by mouth every 8 (eight) hours as needed for nausea or vomiting.   propranolol 10 MG tablet Commonly known as: INDERAL Take 1 tablet (10 mg total) by mouth 3 (three) times daily as needed (Fast heart rate and anxiety).   ranitidine 150 MG tablet Commonly known as: ZANTAC Take 150 mg by mouth daily.   SUMAtriptan 50 MG tablet Commonly known as: IMITREX Take 1 tablet (50 mg total) by mouth every 2 (two) hours as needed for migraine or headache. May repeat in 2 hours if  headache persists or recurs.   Ventolin HFA 108 (90 Base) MCG/ACT inhaler Generic drug: albuterol Inhale 1 puff into the lungs as needed.        Follow-up Information     Jerene Bears, MD Follow up in 1 week(s).   Specialty: Obstetrics and Gynecology Why: hospital follow up in 1-2 weeks s/p transfusion Contact information: [address] [place] [address] [phone]                 Signed: Warden Fillers 06/03/2022, 11:34 AM

## 2022-06-03 NOTE — Plan of Care (Signed)
Pt discharged home with printed instructions. Unnamed Zeien L Kaisha Wachob, RN  

## 2022-06-03 NOTE — Progress Notes (Signed)
Subjective: Patient reports tolerating PO.  Less dizziness but she has a migraine and some nausea. Slight relief after Toradol  Objective: I have reviewed patient's vital signs and medications. Blood pressure (!) 112/55, pulse 91, temperature 98.1 F (36.7 C), temperature source Oral, resp. rate 17, SpO2 96 %, not currently breastfeeding.  General: alert, cooperative, and mild distress Resp: effort normal    Assessment/Plan: Sx anemia s/p transfusion repeat CBC now Migraine-Imitrex 50 mg PO now  LOS: 1 day    Scheryl Darter, MD 06/03/2022, 6:43 AM

## 2022-06-04 ENCOUNTER — Telehealth: Payer: Self-pay

## 2022-06-04 ENCOUNTER — Encounter (HOSPITAL_BASED_OUTPATIENT_CLINIC_OR_DEPARTMENT_OTHER): Payer: Medicaid Other | Admitting: Obstetrics & Gynecology

## 2022-06-04 ENCOUNTER — Telehealth (HOSPITAL_BASED_OUTPATIENT_CLINIC_OR_DEPARTMENT_OTHER): Payer: Self-pay | Admitting: *Deleted

## 2022-06-04 NOTE — Telephone Encounter (Signed)
LMOVM for pt to call office to set up follow up appt.

## 2022-06-04 NOTE — Transitions of Care (Post Inpatient/ED Visit) (Signed)
   06/04/2022  Name: Meagan Hunter MRN: [id number] DOB: [birth year]  Today's TOC FU Call Status: Today's TOC FU Call Status:: Unsuccessul Call (1st Attempt) Unsuccessful Call (1st Attempt) Date: 06/04/22  Attempted to reach the patient regarding the most recent Inpatient/ED visit.  Follow Up Plan: Additional outreach attempts will be made to reach the patient to complete the Transitions of Care (Post Inpatient/ED visit) call.   Agnes Lawrence, CMA (AAMA)  [facility] [phone]

## 2022-06-06 LAB — ANTIPHOSPHOLIPID SYNDROME COMP
APTT: 23.5 s
Anticardiolipin Ab, IgA: <10 [APL'U]
Anticardiolipin Ab, IgG: <10 [GPL'U]
Anticardiolipin Ab, IgM: <10 [MPL'U]
Antiphosphatidylserine IgG: 1 {GPS'U}
Antiphosphatidylserine IgM: 4 {MPS'U}
Antiprothrombin Antibody, IgG: 0 G units
Beta-2 Glycoprotein I, IgA: <10 SAU
Beta-2 Glycoprotein I, IgG: <10 SGU
Beta-2 Glycoprotein I, IgM: <10 SMU
DRVVT Screen Seconds: 28.2 s
Hexagonal Phospholipid Neutral: 2 s
Platelet Neutralization: 0.2 s

## 2022-06-06 LAB — CBC
Hematocrit: 23 % — ABNORMAL LOW (ref 34.0–46.6)
Hemoglobin: 7.5 g/dL — ABNORMAL LOW (ref 11.1–15.9)
MCH: 31.6 pg (ref 26.6–33.0)
MCHC: 32.6 g/dL (ref 31.5–35.7)
MCV: 97 fL (ref 79–97)
Platelets: 190 10*3/uL (ref 150–450)
RBC: 2.37 x10E6/uL — CL (ref 3.77–5.28)
RDW: 12.4 % (ref 11.7–15.4)
WBC: 6.5 10*3/uL (ref 3.4–10.8)

## 2022-06-06 LAB — LUPUS ANTICOAGULANT PANEL
Dilute Viper Venom Time: 26.8 s (ref 0.0–47.0)
PTT Lupus Anticoagulant: 27 s (ref 0.0–43.5)

## 2022-06-06 LAB — BETA HCG QUANT (REF LAB): hCG Quant: 4 m[IU]/mL

## 2022-06-10 ENCOUNTER — Ambulatory Visit (INDEPENDENT_AMBULATORY_CARE_PROVIDER_SITE_OTHER): Payer: Medicaid Other | Admitting: Clinical

## 2022-06-10 DIAGNOSIS — F3281 Premenstrual dysphoric disorder: Secondary | ICD-10-CM | POA: Diagnosis not present

## 2022-06-10 DIAGNOSIS — F4321 Adjustment disorder with depressed mood: Secondary | ICD-10-CM

## 2022-06-10 DIAGNOSIS — F902 Attention-deficit hyperactivity disorder, combined type: Secondary | ICD-10-CM

## 2022-06-10 NOTE — Patient Instructions (Addendum)
[facility] [address] [place], [place] [address] [phone] (main office) [phone] (Samah Lapiana's office)  [facility] (Individual and group grief support) [facility]  947-837-9438  Pregnancy Loss Support Groups www.postpartum.net www.[facility].com  [facility]  [address] [place], [place] [address] [phone] or [phone] [facility] 24/7 FOR ANYONE [address] [place], [place]  [phone] Fax: [phone] [email or web address] *Interpreters available *Accepts all insurance and uninsured for Urgent Care needs *Accepts Medicaid and uninsured for outpatient treatment (below)    ONLY FOR [facility]  Below:   Outpatient New Patient Assessment/Therapy Walk-ins:        Monday -Thursday 8am until slots are full.        Every Friday 1pm-4pm  (first come, first served)                   New Patient Psychiatry/Medication Management        Monday-Friday 8am-11am (first come, first served)              For all walk-ins we ask that you arrive by 7:15am, because patients will be seen in the order of arrival.

## 2022-06-12 NOTE — BH Specialist Note (Signed)
Pt did not arrive to video visit and did not answer the phone ; Unable to leave voicemail; left MyChart message for patient.   

## 2022-06-25 ENCOUNTER — Ambulatory Visit (HOSPITAL_BASED_OUTPATIENT_CLINIC_OR_DEPARTMENT_OTHER): Payer: Medicaid Other | Admitting: Obstetrics & Gynecology

## 2022-06-26 ENCOUNTER — Ambulatory Visit: Payer: Medicaid Other | Admitting: Clinical

## 2022-06-26 DIAGNOSIS — Z91199 Patient's noncompliance with other medical treatment and regimen due to unspecified reason: Secondary | ICD-10-CM

## 2022-06-26 NOTE — Transitions of Care (Post Inpatient/ED Visit) (Signed)
   06/26/2022  Name: Meagan Hunter MRN: [id number] DOB: 10/22/1990  Today's TOC FU Call Status: Today's TOC FU Call Status:: Unsuccessul Call (1st Attempt) Unsuccessful Call (1st Attempt) Date: 06/04/22  Attempted to reach the patient regarding the most recent Inpatient/ED visit.  Follow Up Plan: No further outreach attempts will be made at this time. We have been unable to contact the patient.  Signature Agnes Lawrence, CMA (AAMA)  [facility] [phone]

## 2022-06-28 ENCOUNTER — Other Ambulatory Visit: Payer: Self-pay | Admitting: Family

## 2022-06-28 ENCOUNTER — Other Ambulatory Visit: Payer: Self-pay | Admitting: Obstetrics and Gynecology

## 2022-06-28 DIAGNOSIS — K3184 Gastroparesis: Secondary | ICD-10-CM

## 2022-06-28 DIAGNOSIS — F411 Generalized anxiety disorder: Secondary | ICD-10-CM

## 2022-07-01 ENCOUNTER — Ambulatory Visit: Payer: No Typology Code available for payment source

## 2022-07-01 MED ORDER — ONDANSETRON 8 MG PO TBDP
8.0000 mg | ORAL_TABLET | Freq: Three times a day (TID) | ORAL | 0 refills | Status: DC | PRN
Start: 2022-07-01 — End: 2022-07-17

## 2022-07-01 MED ORDER — CLONAZEPAM 0.5 MG PO TABS
0.5000 mg | ORAL_TABLET | Freq: Two times a day (BID) | ORAL | 0 refills | Status: DC | PRN
Start: 2022-07-01 — End: 2022-08-02

## 2022-07-01 NOTE — Telephone Encounter (Signed)
yes, thx

## 2022-07-02 ENCOUNTER — Encounter (HOSPITAL_BASED_OUTPATIENT_CLINIC_OR_DEPARTMENT_OTHER): Payer: Self-pay | Admitting: Obstetrics & Gynecology

## 2022-07-02 ENCOUNTER — Ambulatory Visit (HOSPITAL_BASED_OUTPATIENT_CLINIC_OR_DEPARTMENT_OTHER): Payer: Medicaid Other | Admitting: Obstetrics & Gynecology

## 2022-07-02 VITALS — BP 121/85 | HR 90 | Ht 64.0 in | Wt 134.2 lb

## 2022-07-02 DIAGNOSIS — D508 Other iron deficiency anemias: Secondary | ICD-10-CM

## 2022-07-02 DIAGNOSIS — R002 Palpitations: Secondary | ICD-10-CM

## 2022-07-02 DIAGNOSIS — F3281 Premenstrual dysphoric disorder: Secondary | ICD-10-CM | POA: Diagnosis not present

## 2022-07-02 MED ORDER — FLUOXETINE HCL 10 MG PO TABS
ORAL_TABLET | ORAL | 2 refills | Status: DC
Start: 2022-07-02 — End: 2022-07-04

## 2022-07-02 NOTE — Progress Notes (Signed)
GYNECOLOGY  VISIT  CC:   Follow up  HPI: 32 y.o. [id number] Single White or Caucasian female here for follow up after having retained POCs that finally passed.  However, she did have significant bleeding during that time and did end up received oral TXA and a unit of PRBCs.  She has been on oral iron since.  Reports she does feel better but is still having episodes of feeling palpitations and dizziness.  She does have appt with Dr. Shari Prows on 07/19/22.  Pt is going to have repeat hb and iron studies done today.  Since I last saw her, her father, who was in his early 85's had a fall and head injury and ultimately passed.  This has been very sad for her.  She does not have either parent living now.  Feels she is coping/adjusting.  Has been seen by Tonette Lederer, [facility] provider.  Psychiatry referral was placed.  She does not have appt at this time.   H/O PMDD and can feel mood changing as she feels she is getting closer to regular cycle starting.  Treatment with luteal phase fluoxetine discussed.  She is a bit nervous about medications as sertraline caused seizure in the past.  Discussed using lowest dosage for a few months to see if helps.  She is open to this.    Not interested in pregnancy right now but doesn't want any contraception.  Feels they will likely try again for another pregnancy and to add to their family.   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 60 tablet 0   omeprazole (PRILOSEC) 20 MG capsule Take 20 mg by mouth daily.     SUMAtriptan (IMITREX) 50 MG tablet Take 1 tablet (50 mg total) by mouth every 2 (two) hours as needed for migraine or headache. May repeat in 2 hours if headache persists or recurs. 20 tablet 0   amphetamine-dextroamphetamine (ADDERALL XR) 20 MG 24 hr capsule Take 1 capsule (20 mg total) by mouth every morning. 30 capsule 0   ferrous sulfate 325 (65 FE) MG tablet Take 1 tablet (325 mg total) by mouth every other day. (Patient not taking: Reported on 07/02/2022) 60 tablet 3   ondansetron (ZOFRAN) 4 MG tablet Take 1 tablet (4 mg total) by mouth every 6 (six) hours as needed for nausea. (Patient not taking: Reported on 07/02/2022) 30 tablet 0   ondansetron (ZOFRAN-ODT) 8 MG disintegrating tablet Take 1 tablet (8 mg total) by mouth every 8 (eight) hours as needed for nausea or vomiting. (Patient not taking: Reported on 07/02/2022) 30 tablet 0   propranolol (INDERAL) 10 MG tablet Take 1 tablet (10 mg total) by mouth 3 (three) times daily as needed (Fast heart rate and anxiety). (Patient not taking: Reported on 07/02/2022) 60 tablet 1   VENTOLIN HFA 108 (90 Base) MCG/ACT inhaler Inhale  1 puff into the lungs as needed. (Patient not taking: Reported on 07/02/2022)     No current facility-administered medications on file prior to visit.    ALLERGIES: Penicillins and Zoloft [sertraline]  SH:  married, non smoker  ROS  PHYSICAL EXAMINATION:    BP 121/85   Pulse 90   Ht 5\' 4"  (1.626 m)   Wt 134 lb 3.2 oz (60.9 kg)   LMP 06/28/2022 (Exact Date)   Breastfeeding No   BMI 23.04 kg/m     General appearance: alert, cooperative and appears stated age CV:  Regular rate and rhythm Lungs:  clear to auscultation, no wheezes, rales or rhonchi, symmetric air entry   Assessment/Plan: 1. Other iron deficiency anemia - will check hb and iron levels today - CBC - Iron, TIBC and Ferritin Panel  2. PMDD (premenstrual  dysphoric disorder) - will start luteal phase dosing for treatment - FLUoxetine (PROZAC) 10 MG tablet; Take 1 tablet daily for 14 days starting with cycle  Dispense: 14 tablet; Refill: 2  3.  Palpitations - does have appt with Dr. Shari Prows 5/24.

## 2022-07-03 LAB — IRON,TIBC AND FERRITIN PANEL
Ferritin: 52 ng/mL (ref 15–150)
Iron Saturation: 10 % — ABNORMAL LOW (ref 15–55)
Iron: 34 ug/dL (ref 27–159)
Total Iron Binding Capacity: 334 ug/dL (ref 250–450)
UIBC: 300 ug/dL (ref 131–425)

## 2022-07-03 LAB — CBC
Hematocrit: 37.5 % (ref 34.0–46.6)
Hemoglobin: 12.4 g/dL (ref 11.1–15.9)
MCH: 31.7 pg (ref 26.6–33.0)
MCHC: 33.1 g/dL (ref 31.5–35.7)
MCV: 96 fL (ref 79–97)
Platelets: 199 10*3/uL (ref 150–450)
RBC: 3.91 x10E6/uL (ref 3.77–5.28)
RDW: 13 % (ref 11.7–15.4)
WBC: 4.9 10*3/uL (ref 3.4–10.8)

## 2022-07-04 ENCOUNTER — Encounter (HOSPITAL_BASED_OUTPATIENT_CLINIC_OR_DEPARTMENT_OTHER): Payer: Medicaid Other | Admitting: Obstetrics & Gynecology

## 2022-07-04 ENCOUNTER — Other Ambulatory Visit (HOSPITAL_BASED_OUTPATIENT_CLINIC_OR_DEPARTMENT_OTHER): Payer: Self-pay | Admitting: Obstetrics & Gynecology

## 2022-07-04 MED ORDER — FLUOXETINE HCL 10 MG PO CAPS: ORAL_CAPSULE | ORAL | 2 refills | Status: DC

## 2022-07-17 ENCOUNTER — Other Ambulatory Visit: Payer: Self-pay | Admitting: Family

## 2022-07-17 DIAGNOSIS — K3184 Gastroparesis: Secondary | ICD-10-CM

## 2022-07-17 MED ORDER — ONDANSETRON 8 MG PO TBDP
8.0000 mg | ORAL_TABLET | Freq: Three times a day (TID) | ORAL | 0 refills | Status: DC | PRN
Start: 2022-07-17 — End: 2022-11-06

## 2022-07-18 NOTE — Progress Notes (Signed)
Cardio[facility]st[facility]s Clinic  New Evaluation  Date:  07/19/2022   ID:  Meagan Hunter, DOB Apr 11, 1990, MRN [id number]  PCP:  Dulce Sellar, NP   [facility] Providers Cardiologist:  None  Electrophysiologist:  None       Referring MD: Jerene Bears, MD   Chief Complaint: tachycardia  History of Present Illness:    Meagan Hunter is a 32 y.o. female [[id number]] who is being seen today for the evaluation of tachycardia at the request of Jerene Bears, MD.   Patient with history of retained POCs after miscarriage that passed. She experienced bleeding at that time and required oral iron and 1 unit pRBCs. She began having palpitations/tachycardia despite HgB 12. Given symptoms, she is now referred to Cardiology for further evaluation.  Today, the patient states that she has been having palpitations where she feels like her "heart is racing out of her chest" for several months. States this can occur at rest and with exertion. Has some associated SOB, lightheadedness and dizziness. Symptoms developed prior to anemia and have persisted despite treatment of her anemia. States she cannot exercise due to severe elevation in her HR with significant symptoms at that time.  Notably, she is on adderrall but she went off the medication for about 1 month with no improvement. Trialed off the Klonipin as well but this did not help symptoms. She has since resumed these medications.  Father with history of CHF (age 44) and arrhythmias. Passed away during heart surgery (unsure what procedure he was having performed). Sister has Hashimotos. 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   ferrous sulfate 325 (65 FE) MG tablet Take 1 tablet (325 mg total) by mouth every other day.   FLUoxetine (PROZAC) 10 MG capsule Take 1 capsule daily starting the first day of menstrual cycle.   omeprazole (PRILOSEC) 20 MG capsule Take 20 mg by mouth daily.   ondansetron (ZOFRAN-ODT) 8 MG disintegrating tablet Take 1 tablet (8 mg total) by mouth every 8 (eight) hours as needed for nausea or vomiting.   SUMAtriptan (IMITREX) 50 MG tablet Take 1 tablet (50 mg total) by mouth every 2 (  two) hours as needed for migraine or headache. May repeat in 2 hours if headache persists or recurs.   VENTOLIN HFA 108 (90 Base) MCG/ACT inhaler Inhale 1 puff into the lungs as needed.     Allergies:   Penicillins and Zoloft [sertraline]   Social History   Socioeconomic History   Marital status: Single    Spouse name: Not on file   Number of children: 3   Years of education: Not on file   Highest education level: 12th grade  Occupational History   Occupation: stay at home mom  Tobacco Use   Smoking status: Former    Types: Cigarettes    Quit date: 02/26/2015    Years since quitting: 7.3   Smokeless tobacco: Never  Vaping Use   Vaping Use: Never used  Substance and Sexual Activity   Alcohol use: Not Currently    Alcohol/week: 2.0 standard drinks of alcohol    Types: 2 Standard drinks or  equivalent per week    Comment: 3 per week   Drug use: Never   Sexual activity: Not Currently    Birth control/protection: None  Other Topics Concern   Not on file  Social History Narrative   Not on file   Social Determinants of Health   Financial Resource Strain: Low Risk  (05/26/2022)   Overall Financial Resource Strain (CARDIA)    Difficulty of Paying Living Expenses: Not hard at all  Food Insecurity: No Food Insecurity (05/26/2022)   Hunger Vital Sign    Worried About Running Out of Food in the Last Year: Never true    Ran Out of Food in the Last Year: Never true  Transportation Needs: No Transportation Needs (05/26/2022)   PRAPARE - Administrator, Civil Service (Medical): No    Lack of Transportation (Non-Medical): No  Physical Activity: Inactive (05/26/2022)   Exercise Vital Sign    Days of Exercise per Week: 0 days    Minutes of Exercise per Session: 20 min  Stress: Stress Concern Present (05/26/2022)   [employer] of Occupational Health - Occupational Stress Questionnaire    Feeling of Stress : Very much  Social Connections: Unknown (05/26/2022)   Social Connection and Isolation Panel [NHANES]    Frequency of Communication with Friends and Family: Once a week    Frequency of Social Gatherings with Friends and Family: Once a week    Attends Religious Services: Patient declined    Database administrator or Organizations: Yes    Attends Engineer, structural: More than 4 times per year    Marital Status: Living with partner  Recent Concern: Social Connections - Socially Isolated (04/17/2022)   Social Connection and Isolation Panel [NHANES]    Frequency of Communication with Friends and Family: More than three times a week    Frequency of Social Gatherings with Friends and Family: Once a week    Attends Religious Services: Never    Database administrator or Organizations: No    Attends Engineer, structural: Never    Marital Status: Divorced       Family History  Problem Relation Age of Onset   Hypertension Mother    Ovarian cancer Mother    Thyroid cancer Mother    Crohn's disease Mother    Hypertension Father    Hyperlipidemia Father    Heart failure Father    Hashimoto's thyroiditis Sister    Breast cancer Maternal Aunt    Heart failure Maternal  Grandfather    Heart failure Paternal Grandfather    Colon cancer Paternal Grandfather       ROS:   Please see the history of present illness.     All other systems reviewed and are negative.   Labs/EKG Reviewed:    EKG:   EKG is not ordered today.    Recent Labs: 02/14/2022: TSH 1.34 06/02/2022: ALT 13; BUN 10; Creatinine, Ser 0.83; Magnesium 1.9; Potassium 3.9; Sodium 139 07/02/2022: Hemoglobin 12.4; Platelets 199   Recent Lipid Panel Lab Results  Component Value Date/Time   CHOL 132 02/28/2022 10:34 AM   TRIG 81.0 02/28/2022 10:34 AM   HDL 55.60 02/28/2022 10:34 AM   CHOLHDL 2 02/28/2022 10:34 AM   LDLCALC 60 02/28/2022 10:34 AM    Physical Exam:    VS:  BP 130/88   Pulse (!) 112   Ht 5\' 4"  (1.626 m)   Wt 129 lb 6.4 oz (58.7 kg)   LMP 06/28/2022 (Exact Date)   SpO2 97%   BMI 22.21 kg/m     Wt Readings from Last 3 Encounters:  07/19/22 129 lb 6.4 oz (58.7 kg)  07/02/22 134 lb 3.2 oz (60.9 kg)  05/31/22 135 lb (61.2 kg)     GEN:  Well nourished, well developed in no acute distress HEENT: Normal NECK: No JVD; No carotid bruits CARDIAC: Tachycardic, regular, 1/6 systolic murmur at RUSB RESPIRATORY:  Clear to auscultation without rales, wheezing or rhonchi  ABDOMEN: Soft, non-tender, non-distended MUSCULOSKELETAL:  No edema; No deformity  SKIN: Warm and dry NEUROLOGIC:  Alert and oriented x 3 PSYCHIATRIC:  Normal affect    Risk Assessment/Risk Calculators:      ASSESSMENT & PLAN:    #Palpitations/Tachycardia: -Has been ongoing for several months and preceded the development of anemia and has persisted despite improvement of her  anemia -Reports episodes of elevated HR at rest and with minimal activity up to 200bpm -Symptoms are exacerbated with activity and she is unable to exercise due to significant palpitations -Will check 3 day zio to assess further -Given worsening symptoms with exertion, patient would like to pursue ETT as well -Check TTE -Check TSH, T3, T4 -Continue management of anemia per Dr. Hyacinth Meeker -Increase hydration with electrolyte replacement -No significant caffeine use -Remains on adderrall and klonipin but symptoms did not improve off the medications and therefore she resumed them  Patient Instructions  Medication Instructions:   Your physician recommends that you continue on your current medications as directed. Please refer to the Current Medication list given to you today.  *If you need a refill on your cardiac medications before your next appointment, please call your pharmacy*   Lab Work:  SOMETIME SOON AT OUR [address] LOCATION--TSH, FREE T3, FREE T4  If you have labs (blood work) drawn today and your tests are completely normal, you will receive your results only by: MyChart Message (if you have MyChart) OR A paper copy in the mail If you have any lab test that is abnormal or we need to change your treatment, we will call you to review the results.   Testing/Procedures:  Your physician has requested that you have an echocardiogram. Echocardiography is a painless test that uses sound waves to create images of your heart. It provides your doctor with information about the size and shape of your heart and how well your heart's chambers and valves are working. This procedure takes approximately one hour. There are no restrictions for this procedure. Please do NOT wear cologne,  perfume, aftershave, or lotions (deodorant is allowed). Please arrive 15 minutes prior to your appointment time.    Your physician has requested that you have an exercise tolerance test. For further  information please visit [email or web address] Please also follow instruction sheet, as given.  You are scheduled for an Exercise Stress Test on   Please arrive 15 minutes prior to your appointment time for registration and insurance purposes.  The test will take approximately 45 minutes to complete.  How to prepare for your Exercise Stress Test:  Do wear comfortable clothes (no dresses or overalls) and walking shoes, tennis shoes preferred (no heels or open toed shoes are allowed) Do Not wear cologne, perfume, aftershave or lotions (deodorant is allowed).   If these instructions are not followed, your test will have to be rescheduled.     ZIO XT- Long Term Monitor Instructions  Your physician has requested you wear a ZIO patch monitor for 3 days.  This is a single patch monitor. [employer] supplies one patch monitor per enrollment. Additional stickers are not available. Please do not apply patch if you will be having a Nuclear Stress Test,  Echocardiogram, Cardiac CT, MRI, or Chest Xray during the period you would be wearing the  monitor. The patch cannot be worn during these tests. You cannot remove and re-apply the  ZIO XT patch monitor.  Your ZIO patch monitor will be mailed 3 day USPS to your address on file. It may take 3-5 days  to receive your monitor after you have been enrolled.  Once you have received your monitor, please review the enclosed instructions. Your monitor  has already been registered assigning a specific monitor serial # to you.  Billing and Patient Assistance Program Information  We have supplied [employer] with any of your insurance information on file for billing purposes. [employer] offers a sliding scale Patient Assistance Program for patients that do not have  insurance, or whose insurance does not completely cover the cost of the ZIO monitor.  You must apply for the Patient Assistance Program to qualify for this discounted rate.  To apply, please call  [employer] at [phone], select option 4, select option 2, ask to apply for  Patient Assistance Program. Meredeth Ide will ask your household income, and how many people  are in your household. They will quote your out-of-pocket cost based on that information.  [employer] will also be able to set up a 4-month, interest-free payment plan if needed.  Applying the monitor   Shave hair from upper left chest.  Hold abrader disc by orange tab. Rub abrader in 40 strokes over the upper left chest as  indicated in your monitor instructions.  Clean area with 4 enclosed alcohol pads. Let dry.  Apply patch as indicated in monitor instructions. Patch will be placed under collarbone on left  side of chest with arrow pointing upward.  Rub patch adhesive wings for 2 minutes. Remove white label marked "1". Remove the white  label marked "2". Rub patch adhesive wings for 2 additional minutes.  While looking in a mirror, press and release button in center of patch. A small green light will  flash 3-4 times. This will be your only indicator that the monitor has been turned on.  Do not shower for the first 24 hours. You may shower after the first 24 hours.  Press the button if you feel a symptom. You will hear a small click. Record Date, Time and  Symptom in the Patient Logbook.  When you  are ready to remove the patch, follow instructions on the last 2 pages of Patient  Logbook. Stick patch monitor onto the last page of Patient Logbook.  Place Patient Logbook in the blue and white box. Use locking tab on box and tape box closed  securely. The blue and white box has prepaid postage on it. Please place it in the mailbox as  soon as possible. Your physician should have your test results approximately 7 days after the  monitor has been mailed back to [facility].  Call [facility] Customer Care at [phone] if you have questions regarding  your ZIO XT patch monitor. Call them immediately if you see an orange  light blinking on your  monitor.  If your monitor falls off in less than 4 days, contact our Monitor department at [phone].  If your monitor becomes loose or falls off after 4 days call [employer] at [phone] for  suggestions on securing your monitor    Follow-Up:  3 MONTHS WITH AN EXTENDER IN THE OFFICE  6 MONTHS WITH BRIDGETTE CHRISTOPHER AT [place] LOCATION    Dispo:  No follow-ups on file.   Medication Adjustments/Labs and Tests Ordered: Current medicines are reviewed at length with the patient today.  Concerns regarding medicines are outlined above.  Tests Ordered: Orders Placed This Encounter  Procedures   TSH   T3, free   T4, free   LONG TERM MONITOR (3-14 DAYS)   Exercise Tolerance Test   ECHOCARDIOGRAM COMPLETE   Medication Changes: No orders of the defined types were placed in this encounter.

## 2022-07-19 ENCOUNTER — Ambulatory Visit: Payer: Medicaid Other | Attending: Cardiology

## 2022-07-19 ENCOUNTER — Encounter: Payer: Self-pay | Admitting: Cardiology

## 2022-07-19 ENCOUNTER — Ambulatory Visit (INDEPENDENT_AMBULATORY_CARE_PROVIDER_SITE_OTHER): Payer: Medicaid Other | Admitting: Cardiology

## 2022-07-19 ENCOUNTER — Encounter: Payer: Self-pay | Admitting: *Deleted

## 2022-07-19 VITALS — BP 130/88 | HR 112 | Ht 64.0 in | Wt 129.4 lb

## 2022-07-19 DIAGNOSIS — R002 Palpitations: Secondary | ICD-10-CM

## 2022-07-19 NOTE — Progress Notes (Unsigned)
Enrolled patient for a 3 day ZIo XT monitor to be mailed to patients home ?

## 2022-07-19 NOTE — Patient Instructions (Signed)
Medication Instructions:   Your physician recommends that you continue on your current medications as directed. Please refer to the Current Medication list given to you today.  *If you need a refill on your cardiac medications before your next appointment, please call your pharmacy*   Lab Work:  SOMETIME SOON AT OUR [address] LOCATION--TSH, FREE T3, FREE T4  If you have labs (blood work) drawn today and your tests are completely normal, you will receive your results only by: MyChart Message (if you have MyChart) OR A paper copy in the mail If you have any lab test that is abnormal or we need to change your treatment, we will call you to review the results.   Testing/Procedures:  Your physician has requested that you have an echocardiogram. Echocardiography is a painless test that uses sound waves to create images of your heart. It provides your doctor with information about the size and shape of your heart and how well your heart's chambers and valves are working. This procedure takes approximately one hour. There are no restrictions for this procedure. Please do NOT wear cologne, perfume, aftershave, or lotions (deodorant is allowed). Please arrive 15 minutes prior to your appointment time.    Your physician has requested that you have an exercise tolerance test. For further information please visit [email or web address] Please also follow instruction sheet, as given.  You are scheduled for an Exercise Stress Test on   Please arrive 15 minutes prior to your appointment time for registration and insurance purposes.  The test will take approximately 45 minutes to complete.  How to prepare for your Exercise Stress Test:  Do wear comfortable clothes (no dresses or overalls) and walking shoes, tennis shoes preferred (no heels or open toed shoes are allowed) Do Not wear cologne, perfume, aftershave or lotions (deodorant is allowed).   If these instructions are not followed, your  test will have to be rescheduled.     ZIO XT- Long Term Monitor Instructions  Your physician has requested you wear a ZIO patch monitor for 3 days.  This is a single patch monitor. [employer] supplies one patch monitor per enrollment. Additional stickers are not available. Please do not apply patch if you will be having a Nuclear Stress Test,  Echocardiogram, Cardiac CT, MRI, or Chest Xray during the period you would be wearing the  monitor. The patch cannot be worn during these tests. You cannot remove and re-apply the  ZIO XT patch monitor.  Your ZIO patch monitor will be mailed 3 day USPS to your address on file. It may take 3-5 days  to receive your monitor after you have been enrolled.  Once you have received your monitor, please review the enclosed instructions. Your monitor  has already been registered assigning a specific monitor serial # to you.  Billing and Patient Assistance Program Information  We have supplied [employer] with any of your insurance information on file for billing purposes. [employer] offers a sliding scale Patient Assistance Program for patients that do not have  insurance, or whose insurance does not completely cover the cost of the ZIO monitor.  You must apply for the Patient Assistance Program to qualify for this discounted rate.  To apply, please call [employer] at [phone], select option 4, select option 2, ask to apply for  Patient Assistance Program. Meredeth Ide will ask your household income, and how many people  are in your household. They will quote your out-of-pocket cost based on that information.  [employer] will also be able  to set up a 43-month, interest-free payment plan if needed.  Applying the monitor   Shave hair from upper left chest.  Hold abrader disc by orange tab. Rub abrader in 40 strokes over the upper left chest as  indicated in your monitor instructions.  Clean area with 4 enclosed alcohol pads. Let dry.  Apply patch as indicated in monitor  instructions. Patch will be placed under collarbone on left  side of chest with arrow pointing upward.  Rub patch adhesive wings for 2 minutes. Remove white label marked "1". Remove the white  label marked "2". Rub patch adhesive wings for 2 additional minutes.  While looking in a mirror, press and release button in center of patch. A small green light will  flash 3-4 times. This will be your only indicator that the monitor has been turned on.  Do not shower for the first 24 hours. You may shower after the first 24 hours.  Press the button if you feel a symptom. You will hear a small click. Record Date, Time and  Symptom in the Patient Logbook.  When you are ready to remove the patch, follow instructions on the last 2 pages of Patient  Logbook. Stick patch monitor onto the last page of Patient Logbook.  Place Patient Logbook in the blue and white box. Use locking tab on box and tape box closed  securely. The blue and white box has prepaid postage on it. Please place it in the mailbox as  soon as possible. Your physician should have your test results approximately 7 days after the  monitor has been mailed back to [facility].  Call [facility] Customer Care at [phone] if you have questions regarding  your ZIO XT patch monitor. Call them immediately if you see an orange light blinking on your  monitor.  If your monitor falls off in less than 4 days, contact our Monitor department at [phone].  If your monitor becomes loose or falls off after 4 days call [employer] at [phone] for  suggestions on securing your monitor    Follow-Up:  3 MONTHS WITH AN EXTENDER IN THE OFFICE  6 MONTHS WITH BRIDGETTE CHRISTOPHER AT [facility] LOCATION

## 2022-07-23 ENCOUNTER — Telehealth: Payer: Self-pay | Admitting: *Deleted

## 2022-07-23 NOTE — Telephone Encounter (Signed)
-----   Message from Flavia Shipper sent at 07/23/2022  7:51 AM EDT ----- Regarding: RE: 3 DAY ZIO PER DR. Shari Prows Done ----- Message ----- From: Loa Socks, LPN Sent: 06/03/8117   4:59 PM EDT To: Ernst Bowler; Katrina Claria Dice Subject: 3 DAY ZIO PER DR. Shari Prows                    3 day zio for palpitations  Please enroll and let me know when you do  Thanks Meagan Hunter

## 2022-07-24 ENCOUNTER — Encounter: Payer: Self-pay | Admitting: Cardiology

## 2022-07-24 ENCOUNTER — Ambulatory Visit: Payer: Medicaid Other | Attending: Cardiology

## 2022-07-31 ENCOUNTER — Other Ambulatory Visit: Payer: Self-pay | Admitting: Family

## 2022-07-31 DIAGNOSIS — F411 Generalized anxiety disorder: Secondary | ICD-10-CM

## 2022-07-31 DIAGNOSIS — K3184 Gastroparesis: Secondary | ICD-10-CM

## 2022-08-01 ENCOUNTER — Encounter (HOSPITAL_BASED_OUTPATIENT_CLINIC_OR_DEPARTMENT_OTHER): Payer: Medicaid Other | Admitting: Obstetrics & Gynecology

## 2022-08-01 ENCOUNTER — Other Ambulatory Visit: Payer: Self-pay

## 2022-08-01 MED ORDER — VENTOLIN HFA 108 (90 BASE) MCG/ACT IN AERS
1.0000 | INHALATION_SPRAY | RESPIRATORY_TRACT | 0 refills | Status: DC | PRN
  Filled 2022-08-01: qty 18, 25d supply, fill #0

## 2022-08-01 NOTE — Telephone Encounter (Signed)
yes - but need to know what for? Asthma? allergy induced? exercise? thx

## 2022-08-02 ENCOUNTER — Other Ambulatory Visit: Payer: Self-pay | Admitting: Family

## 2022-08-02 ENCOUNTER — Encounter: Payer: Self-pay | Admitting: Family

## 2022-08-02 DIAGNOSIS — F411 Generalized anxiety disorder: Secondary | ICD-10-CM

## 2022-08-02 DIAGNOSIS — K3184 Gastroparesis: Secondary | ICD-10-CM

## 2022-08-02 MED ORDER — CLONAZEPAM 0.5 MG PO TABS
0.5000 mg | ORAL_TABLET | Freq: Every day | ORAL | 2 refills | Status: DC | PRN
Start: 2022-08-02 — End: 2022-11-06

## 2022-08-02 NOTE — Telephone Encounter (Signed)
Let Meagan Hunter know I am reducing Klonopin to daily again - I told her I was only increasing for the one refill d/t the addiction potential of med. She needs to get ongoing Zofran refills from gastroenterology, this is not a medication that is taken daily long term, need to know the source of her nausea.  Thx

## 2022-08-16 ENCOUNTER — Other Ambulatory Visit: Payer: Self-pay

## 2022-08-22 ENCOUNTER — Ambulatory Visit (INDEPENDENT_AMBULATORY_CARE_PROVIDER_SITE_OTHER): Payer: Medicaid Other

## 2022-08-22 ENCOUNTER — Ambulatory Visit: Payer: Medicaid Other | Attending: Internal Medicine

## 2022-08-22 DIAGNOSIS — R002 Palpitations: Secondary | ICD-10-CM

## 2022-08-22 LAB — EXERCISE TOLERANCE TEST: Rest BP: 87 mmHg

## 2022-08-22 LAB — ECHOCARDIOGRAM COMPLETE
Area-P 1/2: 3.76 cm2
S' Lateral: 3.1 cm

## 2022-08-23 LAB — EXERCISE TOLERANCE TEST
Angina Index: 0
Duke Treadmill Score: 10
Estimated workload: 11.7
Exercise duration (min): 10 min
Exercise duration (sec): 0 s
MPHR: 188 {beats}/min
Peak HR: 179 {beats}/min
Percent HR: 95 %
RPE: 15
Rest HR: 1.6 {beats}/min
ST Depression (mm): 0 mm

## 2022-08-26 ENCOUNTER — Encounter (HOSPITAL_BASED_OUTPATIENT_CLINIC_OR_DEPARTMENT_OTHER): Payer: Medicaid Other | Admitting: Obstetrics & Gynecology

## 2022-09-16 ENCOUNTER — Encounter: Payer: Self-pay | Admitting: Family

## 2022-09-16 ENCOUNTER — Encounter (HOSPITAL_BASED_OUTPATIENT_CLINIC_OR_DEPARTMENT_OTHER): Payer: Medicaid Other | Admitting: Obstetrics & Gynecology

## 2022-09-16 ENCOUNTER — Other Ambulatory Visit: Payer: Self-pay

## 2022-09-16 DIAGNOSIS — R0602 Shortness of breath: Secondary | ICD-10-CM

## 2022-09-16 MED ORDER — VENTOLIN HFA 108 (90 BASE) MCG/ACT IN AERS
1.0000 | INHALATION_SPRAY | RESPIRATORY_TRACT | 0 refills | Status: DC | PRN
Start: 2022-09-16 — End: 2022-09-26

## 2022-09-19 ENCOUNTER — Encounter: Payer: Self-pay | Admitting: Cardiology

## 2022-09-25 ENCOUNTER — Ambulatory Visit: Payer: Medicaid Other | Attending: Cardiology

## 2022-09-25 DIAGNOSIS — R002 Palpitations: Secondary | ICD-10-CM

## 2022-09-26 ENCOUNTER — Other Ambulatory Visit: Payer: Self-pay

## 2022-09-26 DIAGNOSIS — R0602 Shortness of breath: Secondary | ICD-10-CM

## 2022-09-26 MED ORDER — VENTOLIN HFA 108 (90 BASE) MCG/ACT IN AERS
1.0000 | INHALATION_SPRAY | RESPIRATORY_TRACT | 0 refills | Status: DC | PRN
Start: 2022-09-26 — End: 2022-10-03

## 2022-09-27 DIAGNOSIS — R002 Palpitations: Secondary | ICD-10-CM | POA: Diagnosis not present

## 2022-09-30 ENCOUNTER — Encounter (HOSPITAL_BASED_OUTPATIENT_CLINIC_OR_DEPARTMENT_OTHER): Payer: Medicaid Other | Admitting: Obstetrics & Gynecology

## 2022-10-02 ENCOUNTER — Ambulatory Visit (INDEPENDENT_AMBULATORY_CARE_PROVIDER_SITE_OTHER): Payer: Medicaid Other | Admitting: Medical

## 2022-10-02 ENCOUNTER — Encounter (HOSPITAL_BASED_OUTPATIENT_CLINIC_OR_DEPARTMENT_OTHER): Payer: Self-pay | Admitting: Medical

## 2022-10-02 VITALS — BP 126/82 | HR 81 | Wt 134.2 lb

## 2022-10-02 DIAGNOSIS — Z8759 Personal history of other complications of pregnancy, childbirth and the puerperium: Secondary | ICD-10-CM

## 2022-10-02 NOTE — Progress Notes (Signed)
   History:  [name] is a 32 y.o. [phone] who presents to clinic today for pre-conception counseling. The patient had a miscarriage in March and wants to ensure she is doing all the right things for a successful pregnancy with this attempt. She has resumed regular periods. She is taking prenatal vitamins. She has also had 3 normal full-term pregnancies prior to the miscarriage.    The following portions of the patient's history were reviewed and updated as appropriate: allergies, current medications, family history, past medical history, social history, past surgical history and problem list.  Review of Systems:  Review of Systems  Constitutional:  Negative for fever.  Gastrointestinal:  Negative for abdominal pain.  Genitourinary:        Neg - vaginal bleeding     Objective:  Physical Exam BP 126/82 (BP Location: Right Arm, Patient Position: Sitting, Cuff Size: Normal)   Pulse 81   Wt 134 lb 3.2 oz (60.9 kg)   LMP 09/24/2022   BMI 23.04 kg/m  Physical Exam Constitutional:      General: She is not in acute distress.    Appearance: Normal appearance. She is normal weight.  Cardiovascular:     Rate and Rhythm: Normal rate.  Pulmonary:     Effort: Pulmonary effort is normal.  Skin:    General: Skin is warm and dry.     Coloration: Skin is not pale.  Neurological:     Mental Status: She is alert and oriented to person, place, and time.  Psychiatric:        Mood and Affect: Mood normal.    Assessment & Plan:  1. History of miscarriage - Discussed conception timing, prenatal vitamins and when to call for an appointment following conception - Discussed the likelihood of a normal pregnancy was greater than another miscarriage as risk for first trimester miscarriage is about 1/4 - When patient has +HPT, she will call the office and we will do serial hCG for reassurance of normal developing early pregnancy with [place] around 7-[redacted] weeks gestation   Approximately 15 minutes of  total time was spent with this patient on history taking, chart review, patient education and documentation  Return if symptoms worsen or fail to improve.  Marny Lowenstein, PA-C 10/02/2022 10:28 AM

## 2022-10-03 ENCOUNTER — Other Ambulatory Visit: Payer: Self-pay

## 2022-10-03 DIAGNOSIS — R0602 Shortness of breath: Secondary | ICD-10-CM

## 2022-10-03 MED ORDER — VENTOLIN HFA 108 (90 BASE) MCG/ACT IN AERS
2.0000 | INHALATION_SPRAY | Freq: Four times a day (QID) | RESPIRATORY_TRACT | 3 refills | Status: DC | PRN
Start: 2022-10-03 — End: 2023-04-14

## 2022-10-14 ENCOUNTER — Encounter (HOSPITAL_BASED_OUTPATIENT_CLINIC_OR_DEPARTMENT_OTHER): Payer: Medicaid Other | Admitting: Obstetrics & Gynecology

## 2022-10-16 NOTE — Progress Notes (Deleted)
Cardiology Office Note:  .   Date:  10/16/2022  ID:  Meagan Hunter, DOB 11-Jun-1990, MRN [id number] PCP: Dulce Sellar, NP  [facility] Providers Cardiologist:  None { Click to update primary MD,subspecialty MD or APP then REFRESH:1}   Patient Profile: .      PMH Tachycardia [id number] Anemia Anxiety Bipolar 1 disorder, depressed Bipolar disorder, rapid cycling Depression  She was referred to cardiology for evaluation of tachycardia with HR up to 200 bpm and seen by Dr. Shari Prows on 07/19/22.  She has a history of retained POC's after miscarriage that passed.  She experienced bleeding at that time and required oral iron and 1 unit PRBCs.  She began having palpitations/tachycardia despite hemoglobin 12.  Given symptoms, she is now referred to cardiology for further evaluation.  At the time of visit she reported she feels like "heart is racing out of her chest" for several months.  This can occur at rest and with exertion.  Has some associated SOB, lightheadedness, and dizziness.  Symptoms developed prior to anemia and have persisted despite treatment of anemia.  States she cannot exercise due to severe elevation in HR with significant symptoms at that time.  Notably, she is on Adderall but went off the medication for about 1 month with no improvement.  Trialed off the Klonopin as well but this did not help symptoms.  She has since resumed these medications.  Father with history of CHF (age 37) and arrhythmias.  Passed away during heart surgery (unsure what procedure he was having performed).  Sister has Hashimoto's, mother had thyroid cancer. TSH was normal 01/2022.  She underwent additional thyroid testing on 09/25/2022 that revealed no abnormality       History of Present Illness: .   Meagan Hunter is a *** 32 y.o. female ***  ROS: ***       Studies Reviewed: .        *** Risk Assessment/Calculations:   {Does this patient have ATRIAL FIBRILLATION?[phone]} No BP  recorded.  {Refresh Note OR Click here to enter BP  :1}***       Physical Exam:   VS:  LMP 09/24/2022    Wt Readings from Last 3 Encounters:  10/02/22 134 lb 3.2 oz (60.9 kg)  07/19/22 129 lb 6.4 oz (58.7 kg)  07/02/22 134 lb 3.2 oz (60.9 kg)    GEN: Well nourished, well developed in no acute distress NECK: No JVD; No carotid bruits CARDIAC: ***RRR, no murmurs, rubs, gallops RESPIRATORY:  Clear to auscultation without rales, wheezing or rhonchi  ABDOMEN: Soft, non-tender, non-distended EXTREMITIES:  No edema; No deformity     ASSESSMENT AND PLAN: .   ***    {Are you ordering a CV Procedure (e.g. stress test, cath, DCCV, TEE, etc)?   Press F2        :[id number]}  Dispo: ***  Signed, Eligha Bridegroom, NP-C

## 2022-10-22 ENCOUNTER — Ambulatory Visit: Payer: Medicaid Other | Attending: Nurse Practitioner | Admitting: Nurse Practitioner

## 2022-10-23 ENCOUNTER — Encounter: Payer: Self-pay | Admitting: Nurse Practitioner

## 2022-10-29 ENCOUNTER — Encounter (HOSPITAL_BASED_OUTPATIENT_CLINIC_OR_DEPARTMENT_OTHER): Payer: Medicaid Other | Admitting: Obstetrics & Gynecology

## 2022-11-01 ENCOUNTER — Ambulatory Visit: Payer: Medicaid Other | Admitting: Family

## 2022-11-04 ENCOUNTER — Encounter (HOSPITAL_BASED_OUTPATIENT_CLINIC_OR_DEPARTMENT_OTHER): Payer: Medicaid Other | Admitting: Obstetrics & Gynecology

## 2022-11-06 ENCOUNTER — Telehealth (INDEPENDENT_AMBULATORY_CARE_PROVIDER_SITE_OTHER): Payer: Medicaid Other | Admitting: Family

## 2022-11-06 VITALS — Ht 64.0 in

## 2022-11-06 DIAGNOSIS — F902 Attention-deficit hyperactivity disorder, combined type: Secondary | ICD-10-CM

## 2022-11-06 DIAGNOSIS — F411 Generalized anxiety disorder: Secondary | ICD-10-CM | POA: Diagnosis not present

## 2022-11-06 MED ORDER — METHYLPHENIDATE HCL 5 MG PO TABS
5.0000 mg | ORAL_TABLET | Freq: Two times a day (BID) | ORAL | 0 refills | Status: DC
Start: 2022-12-06 — End: 2022-12-24

## 2022-11-06 MED ORDER — METHYLPHENIDATE HCL 5 MG PO TABS
5.0000 mg | ORAL_TABLET | Freq: Two times a day (BID) | ORAL | 0 refills | Status: DC
Start: 2023-01-06 — End: 2022-12-24

## 2022-11-06 MED ORDER — METHYLPHENIDATE HCL 5 MG PO TABS
5.0000 mg | ORAL_TABLET | Freq: Two times a day (BID) | ORAL | 0 refills | Status: DC
Start: 2022-11-06 — End: 2022-12-24

## 2022-11-06 MED ORDER — CLONAZEPAM 0.5 MG PO TABS
0.5000 mg | ORAL_TABLET | Freq: Every day | ORAL | 2 refills | Status: DC | PRN
Start: 2022-11-06 — End: 2022-12-24

## 2022-11-06 NOTE — Assessment & Plan Note (Signed)
chronic, stable pt taking only Klonopin for her anxiety says she has been doing better since her miscarriage at beginning of the year given Propranolol last visit to help sx & pt also having tachycardia, pt states she did not tolerate sending refill of Klonopin 0.5mg  daily prn f/u 3 mo or prn

## 2022-11-06 NOTE — Progress Notes (Unsigned)
MyChart Video Visit    Virtual Visit via Video Note   This format is felt to be most appropriate for this patient at this time. Physical exam was limited by quality of the video and audio technology used for the visit. CMA was able to get the patient set up on a video visit.  Patient location: Home. Patient and provider in visit Provider location: Office  I discussed the limitations of evaluation and management by telemedicine and the availability of in person appointments. The patient expressed understanding and agreed to proceed.  Visit Date: 11/06/2022  Today's healthcare provider: Dulce Sellar, NP     Subjective:   Patient ID: Meagan Hunter, female    DOB: 08/27/1990, 32 y.o.   MRN: [id number]  Chief Complaint  Patient presents with   ADHD   Anxiety   HPI PMDD/Anxiety/Tachycardia:   Anxiety is worse during her luteal phase & she has tracked using a phone app, but also having anxiety sx sometimes daily. Had been on Zoloft in past and caused seizures, wants to avoid SSRI meds. Had also been on Effexor in past. Has been seeing a therapist. Pt reports stopping the Prozac, was not helping, and she overall is doing better, taking Klonopin just daily prn and mainly during the luteal phase of her menstrual cycle.  ADHD f/u: Medications helping target goals: Adderall 20mg  ER, pt denies SE, helped sx slightly. Reports having to take IR adderall last month d/t shortage, which pt did not like and worsened her anxiety. Lately reports the ER not working as well & she thinks related to her gastroparesis not metabolizing the med well. Reports IR Ritalin in the past did work for her and did not think affect her anxiety, she would like to try this again. Regimen: taking most days Medication side effects/concerns: see above. Weight: wnl Sleep: normal Mood changes: denies Tics:  denies Blood pressure, Weight, Pulse, Behavior reviewed: wnl   Assessment & Plan:  There are no  diagnoses linked to this encounter.  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 30 tablet 2   omeprazole (PRILOSEC) 20 MG capsule Take 20 mg by mouth daily.     VENTOLIN HFA 108 (90 Base) MCG/ACT inhaler Inhale 2 puffs into the lungs every 6 (six) hours as needed. 6.7 g 3   amphetamine-dextroamphetamine (ADDERALL XR) 20 MG 24 hr capsule Take 1 capsule (20 mg total) by mouth every morning. 30 capsule 0   FLUoxetine (PROZAC) 10 MG capsule Take 1 capsule daily starting the first day of menstrual cycle. 14 capsule 2   ferrous sulfate 325 (65 FE) MG tablet Take 1 tablet (325 mg total) by mouth every other day. 60 tablet 3   ondansetron (ZOFRAN-ODT) 8 MG disintegrating tablet Take 1 tablet (8  mg total) by mouth every 8 (eight) hours as needed for nausea or vomiting. 30 tablet 0   No facility-administered medications prior to visit.    Allergies  Allergen Reactions   Penicillins Hives, Itching, Nausea And Vomiting and Other (See Comments)    Other reaction(s): Fever Fever, rash and vomiting Fever, rash and vomiting Other reaction(s): Fever Fever, rash and vomiting   Zoloft [Sertraline] Other (See Comments)    seizures Other reaction(s): Seizures seizures       Objective:   Physical Exam Vitals and nursing note reviewed.  Constitutional:      General: Pt is not in acute distress.     Appearance: Normal appearance.  HENT:     Head: Normocephalic.  Pulmonary:     Effort: No respiratory distress.  Musculoskeletal:     Cervical back: Normal range of motion.  Skin:    General: Skin is dry.     Coloration: Skin is not pale.  Neurological:     Mental Status: Pt is alert and oriented to person, place, and time.  Psychiatric:        Mood and Affect: Mood normal.   Ht 5\' 4"  (1.626 m)   BMI 23.04 kg/m   Wt Readings from Last 3 Encounters:  10/02/22 134 lb 3.2 oz (60.9 kg)  07/19/22 129 lb 6.4 oz (58.7 kg)  07/02/22 134 lb 3.2 oz (60.9 kg)        I discussed the assessment and treatment plan with the patient. The patient was provided an opportunity to ask questions and all were answered. The patient agreed with the plan and demonstrated an understanding of the instructions.   The patient was advised to call back or seek an in-person evaluation if the symptoms worsen or if the condition fails to improve as anticipated.  Dulce Sellar, NP [facility] [phone] (phone) [phone] (fax)  [facility]

## 2022-11-07 NOTE — Assessment & Plan Note (Signed)
chronic taking Adderall 20mg  ER, states this is not effective any longer, would like to retry Ritalin IR that she took many years ago but tolerated well and did not make her anxious sending generic Ritalin 5mg  bid, advised on use & SE f/u 3 month

## 2022-11-11 ENCOUNTER — Encounter (HOSPITAL_BASED_OUTPATIENT_CLINIC_OR_DEPARTMENT_OTHER): Payer: Medicaid Other | Admitting: Obstetrics & Gynecology

## 2022-11-18 ENCOUNTER — Encounter (HOSPITAL_BASED_OUTPATIENT_CLINIC_OR_DEPARTMENT_OTHER): Payer: Medicaid Other | Admitting: Obstetrics & Gynecology

## 2022-11-25 ENCOUNTER — Encounter (HOSPITAL_BASED_OUTPATIENT_CLINIC_OR_DEPARTMENT_OTHER): Payer: Medicaid Other | Admitting: Obstetrics & Gynecology

## 2022-12-18 ENCOUNTER — Other Ambulatory Visit (HOSPITAL_BASED_OUTPATIENT_CLINIC_OR_DEPARTMENT_OTHER): Payer: Medicaid Other

## 2022-12-18 DIAGNOSIS — N926 Irregular menstruation, unspecified: Secondary | ICD-10-CM

## 2022-12-19 ENCOUNTER — Encounter (HOSPITAL_BASED_OUTPATIENT_CLINIC_OR_DEPARTMENT_OTHER): Payer: Self-pay | Admitting: Obstetrics & Gynecology

## 2022-12-19 LAB — BETA HCG QUANT (REF LAB): hCG Quant: 16 m[IU]/mL

## 2022-12-20 ENCOUNTER — Other Ambulatory Visit (HOSPITAL_BASED_OUTPATIENT_CLINIC_OR_DEPARTMENT_OTHER): Payer: Medicaid Other

## 2022-12-20 DIAGNOSIS — N926 Irregular menstruation, unspecified: Secondary | ICD-10-CM

## 2022-12-21 LAB — BETA HCG QUANT (REF LAB): hCG Quant: 111 m[IU]/mL

## 2022-12-23 ENCOUNTER — Other Ambulatory Visit (HOSPITAL_BASED_OUTPATIENT_CLINIC_OR_DEPARTMENT_OTHER): Payer: Self-pay | Admitting: *Deleted

## 2022-12-23 ENCOUNTER — Encounter (HOSPITAL_BASED_OUTPATIENT_CLINIC_OR_DEPARTMENT_OTHER): Payer: Medicaid Other

## 2022-12-23 MED ORDER — PROGESTERONE 200 MG PO CAPS: 200.0000 mg | ORAL_CAPSULE | Freq: Every day | ORAL | 1 refills | Status: DC

## 2022-12-24 ENCOUNTER — Ambulatory Visit (HOSPITAL_BASED_OUTPATIENT_CLINIC_OR_DEPARTMENT_OTHER): Payer: Medicaid Other | Admitting: Obstetrics & Gynecology

## 2022-12-24 ENCOUNTER — Encounter (HOSPITAL_BASED_OUTPATIENT_CLINIC_OR_DEPARTMENT_OTHER): Payer: Self-pay | Admitting: Obstetrics & Gynecology

## 2022-12-24 VITALS — BP 120/79 | HR 83 | Ht 64.0 in | Wt 132.4 lb

## 2022-12-24 DIAGNOSIS — Z8759 Personal history of other complications of pregnancy, childbirth and the puerperium: Secondary | ICD-10-CM | POA: Diagnosis not present

## 2022-12-24 DIAGNOSIS — N812 Incomplete uterovaginal prolapse: Secondary | ICD-10-CM | POA: Diagnosis not present

## 2022-12-24 NOTE — Progress Notes (Signed)
GYNECOLOGY  VISIT  CC:   early pregnancy, prolapse  HPI: 32 y.o. [id number] Single White or Caucasian female here for concerns about prolapse.  She's felt some vaginal prolapse in the past with early pregnancy.  She had a miscarriage with her last pregnancy so does have some anxiety about this one.  No vaginal bleeding.  Repeat HCG scheduled for 10/31.  Viability scan is scheduled 11/13.  Does not have urinary incontinence but does have urinary urgency.  She is having some pelvic cramping as well.     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     Prenatal Vit-Fe Fumarate-FA (MULTIVITAMIN-PRENATAL) 27-0.8 MG TABS tablet Take 1 tablet by mouth daily at 12 noon.     progesterone (PROMETRIUM) 200 MG capsule Place 1 capsule (200 mg total) vaginally daily. 30 capsule 1   VENTOLIN HFA 108 (90 Base) MCG/ACT inhaler Inhale 2 puffs into the lungs every 6 (six) hours as needed. (Patient not taking: Reported on 12/24/2022) 6.7 g 3   No current facility-administered medications on file prior to visit.    ALLERGIES: Penicillins and Zoloft [sertraline]  SH:  partnered, non smoker  Review of Systems  Constitutional: Negative.   Genitourinary: Negative.     PHYSICAL EXAMINATION:    BP 120/79   Pulse 83   Ht 5\' 4"  (1.626 m)   Wt 132 lb 6.4 oz (60.1 kg)   LMP 11/21/2022   BMI 22.73 kg/m     General appearance: alert, cooperative and appears stated age Lymph:  no inguinal LAD noted  Pelvic: External genitalia:  no lesions               Urethra:  normal appearing urethra with no masses, tenderness or lesions              Bartholins and Skenes: normal                 Vagina: normal mucosa without prolapse or lesions              Cervix: no lesions              Bimanual Exam:  Uterus:  normal size, contour, position, consistency, mobility, non-tender and prolapsed second degree              Adnexa: no mass, fullness, tenderness   Assessment/Plan: 1. Incomplete uterine prolapse - discussed possible pessary use if has worsening prolapse symptoms - Ambulatory referral to Physical Therapy  2. History of miscarriage - using vaginal progesterone 200mg  PV nightly - Viability u/s scheduled - she will return 10/31 for repeat HCG

## 2022-12-26 ENCOUNTER — Other Ambulatory Visit (HOSPITAL_BASED_OUTPATIENT_CLINIC_OR_DEPARTMENT_OTHER): Payer: Medicaid Other

## 2022-12-26 DIAGNOSIS — N926 Irregular menstruation, unspecified: Secondary | ICD-10-CM

## 2022-12-27 LAB — BETA HCG QUANT (REF LAB): hCG Quant: 1440 m[IU]/mL

## 2023-01-08 ENCOUNTER — Encounter (HOSPITAL_BASED_OUTPATIENT_CLINIC_OR_DEPARTMENT_OTHER): Payer: Self-pay

## 2023-01-08 ENCOUNTER — Ambulatory Visit (HOSPITAL_BASED_OUTPATIENT_CLINIC_OR_DEPARTMENT_OTHER): Payer: Medicaid Other | Admitting: *Deleted

## 2023-01-08 ENCOUNTER — Ambulatory Visit (HOSPITAL_BASED_OUTPATIENT_CLINIC_OR_DEPARTMENT_OTHER): Payer: Medicaid Other

## 2023-01-08 ENCOUNTER — Other Ambulatory Visit (HOSPITAL_BASED_OUTPATIENT_CLINIC_OR_DEPARTMENT_OTHER): Payer: Self-pay | Admitting: Obstetrics & Gynecology

## 2023-01-08 VITALS — BP 109/64 | HR 81 | Wt 135.4 lb

## 2023-01-08 DIAGNOSIS — Z3480 Encounter for supervision of other normal pregnancy, unspecified trimester: Secondary | ICD-10-CM

## 2023-01-08 DIAGNOSIS — Z3A01 Less than 8 weeks gestation of pregnancy: Secondary | ICD-10-CM | POA: Diagnosis not present

## 2023-01-08 DIAGNOSIS — O3680X0 Pregnancy with inconclusive fetal viability, not applicable or unspecified: Secondary | ICD-10-CM

## 2023-01-08 DIAGNOSIS — Z3481 Encounter for supervision of other normal pregnancy, first trimester: Secondary | ICD-10-CM | POA: Diagnosis not present

## 2023-01-08 DIAGNOSIS — O3680X1 Pregnancy with inconclusive fetal viability, fetus 1: Secondary | ICD-10-CM | POA: Diagnosis not present

## 2023-01-08 MED ORDER — BLOOD PRESSURE KIT DEVI: 1.0000 | Freq: Once | 0 refills | Status: AC

## 2023-01-08 NOTE — Progress Notes (Signed)
New OB Intake I explained I am completing New OB Intake today. We discussed EDD of 08/28/2023, by Last Menstrual Period. Pt is [id number]. I reviewed her allergies, medications and Medical/Surgical/OB history.    Patient Active Problem List   Diagnosis Date Noted   Supervision of other normal pregnancy, antepartum 01/09/2023   Generalized anxiety disorder 02/28/2022   ADHD (attention deficit hyperactivity disorder), combined type 01/14/2022   Lupus (systemic lupus erythematosus) (HCC) 06/04/2020   Bipolar disorder, current episode mixed, moderate (HCC) 06/20/2015    Concerns addressed today  Delivery Plans Plans to deliver at [facility]. Discussed the nature of our practice with multiple providers including residents and students. Due to the size of the practice, the delivering provider may not be the same as those providing prenatal care.   Patient  may be  interested in water birth. Offered upcoming OB visit with CNM to discuss further.  MyChart/Babyscripts MyChart access verified. I explained pt will have some visits in office and some virtually. Babyscripts instructions given and order placed. Patient verifies receipt of registration text/e-mail. Account successfully created and app downloaded.  Blood Pressure Cuff/Weight Scale Blood pressure cuff ordered for patient to pick-up from [employer]. Explained after first prenatal appt pt will check weekly and document in Babyscripts. Patient does have weight scale.  Anatomy US Explained first scheduled [place] will be around 19 weeks. Anatomy US scheduled for 04/07/23 at 1:15.  Is patient a CenteringPregnancy candidate?  Declined Declined due to Group setting    Is patient a Mom+Baby Combined Care candidate?  Declined   If accepted, confirm patient does not intend to move from the area for at least 12 months, then notify Mom+Baby staff   Is patient a candidate for Babyscripts Optimization? Yes  First visit review I reviewed new OB  appt with patient. Explained pt will be seen by Merrilee Jansky, CNM at first visit. Pt would like  Panorama done and would like to know the gender. Horizon neg 4/4 done on 04/29/22. Routine prenatal labs  needed.     Last Pap 06/05/20 normal2  Harrie Jeans, RN 01/09/2023  2:19 PM

## 2023-01-09 ENCOUNTER — Encounter (HOSPITAL_BASED_OUTPATIENT_CLINIC_OR_DEPARTMENT_OTHER): Payer: Self-pay | Admitting: *Deleted

## 2023-01-09 DIAGNOSIS — Z3480 Encounter for supervision of other normal pregnancy, unspecified trimester: Secondary | ICD-10-CM | POA: Insufficient documentation

## 2023-01-09 DIAGNOSIS — O0990 Supervision of high risk pregnancy, unspecified, unspecified trimester: Secondary | ICD-10-CM | POA: Insufficient documentation

## 2023-01-09 HISTORY — DX: Supervision of high risk pregnancy, unspecified, unspecified trimester: O09.90

## 2023-01-10 ENCOUNTER — Other Ambulatory Visit (HOSPITAL_BASED_OUTPATIENT_CLINIC_OR_DEPARTMENT_OTHER): Payer: Self-pay | Admitting: *Deleted

## 2023-01-10 ENCOUNTER — Encounter (HOSPITAL_BASED_OUTPATIENT_CLINIC_OR_DEPARTMENT_OTHER): Payer: Self-pay | Admitting: Obstetrics & Gynecology

## 2023-01-10 MED ORDER — PROMETHAZINE HCL 25 MG PO TABS: 25.0000 mg | ORAL_TABLET | Freq: Four times a day (QID) | ORAL | 0 refills | Status: DC | PRN

## 2023-01-10 NOTE — Progress Notes (Signed)
Pt requests rx for phenergan to help with pregnancy related n/v. Rx sent to pharmacy.

## 2023-01-24 ENCOUNTER — Other Ambulatory Visit (HOSPITAL_BASED_OUTPATIENT_CLINIC_OR_DEPARTMENT_OTHER): Payer: Self-pay | Admitting: Obstetrics & Gynecology

## 2023-01-27 ENCOUNTER — Inpatient Hospital Stay (HOSPITAL_COMMUNITY)
Admission: AD | Admit: 2023-01-27 | Discharge: 2023-01-27 | Disposition: A | Discharge status: Home / Self Care | Payer: Medicaid Other | Attending: Obstetrics and Gynecology | Admitting: Obstetrics and Gynecology

## 2023-01-27 ENCOUNTER — Encounter (HOSPITAL_COMMUNITY): Payer: Self-pay | Admitting: Obstetrics and Gynecology

## 2023-01-27 ENCOUNTER — Encounter (HOSPITAL_BASED_OUTPATIENT_CLINIC_OR_DEPARTMENT_OTHER): Payer: Self-pay | Admitting: Obstetrics & Gynecology

## 2023-01-27 DIAGNOSIS — O26891 Other specified pregnancy related conditions, first trimester: Secondary | ICD-10-CM | POA: Insufficient documentation

## 2023-01-27 DIAGNOSIS — R519 Headache, unspecified: Secondary | ICD-10-CM | POA: Insufficient documentation

## 2023-01-27 DIAGNOSIS — M329 Systemic lupus erythematosus, unspecified: Secondary | ICD-10-CM | POA: Insufficient documentation

## 2023-01-27 DIAGNOSIS — O99111 Other diseases of the blood and blood-forming organs and certain disorders involving the immune mechanism complicating pregnancy, first trimester: Secondary | ICD-10-CM | POA: Diagnosis not present

## 2023-01-27 DIAGNOSIS — Z3A09 9 weeks gestation of pregnancy: Secondary | ICD-10-CM | POA: Insufficient documentation

## 2023-01-27 DIAGNOSIS — O219 Vomiting of pregnancy, unspecified: Secondary | ICD-10-CM | POA: Insufficient documentation

## 2023-01-27 LAB — COMPREHENSIVE METABOLIC PANEL
ALT: 19 U/L (ref 0–44)
AST: 20 U/L (ref 15–41)
Albumin: 3.3 g/dL — ABNORMAL LOW (ref 3.5–5.0)
Alkaline Phosphatase: 34 U/L — ABNORMAL LOW (ref 38–126)
Anion gap: 10 (ref 5–15)
BUN: 12 mg/dL (ref 6–20)
CO2: 19 mmol/L — ABNORMAL LOW (ref 22–32)
Calcium: 8.3 mg/dL — ABNORMAL LOW (ref 8.9–10.3)
Chloride: 108 mmol/L (ref 98–111)
Creatinine, Ser: 0.67 mg/dL (ref 0.44–1.00)
GFR, Estimated: >60 mL/min (ref >60)
Glucose, Bld: 77 mg/dL (ref 70–99)
Potassium: 3.2 mmol/L — ABNORMAL LOW (ref 3.5–5.1)
Sodium: 137 mmol/L (ref 135–145)
Total Bilirubin: 0.6 mg/dL (ref <1.2)
Total Protein: 6.1 g/dL — ABNORMAL LOW (ref 6.5–8.1)

## 2023-01-27 LAB — URINALYSIS, ROUTINE W REFLEX MICROSCOPIC
Bilirubin Urine: NEGATIVE
Glucose, UA: NEGATIVE mg/dL
Hgb urine dipstick: NEGATIVE
Ketones, ur: 80 mg/dL — AB
Nitrite: NEGATIVE
Protein, ur: 100 mg/dL — AB
Specific Gravity, Urine: 1.027 (ref 1.005–1.030)
pH: 5 (ref 5.0–8.0)

## 2023-01-27 MED ORDER — SODIUM CHLORIDE 0.9 % IV SOLN
25.0000 mg | Freq: Once | INTRAVENOUS | Status: AC
  Administered 2023-01-27: 25 mg via INTRAVENOUS
  Filled 2023-01-27: qty 1

## 2023-01-27 MED ORDER — OMEPRAZOLE 20 MG PO CPDR: 20.0000 mg | DELAYED_RELEASE_CAPSULE | Freq: Every day | ORAL | 3 refills | Status: DC

## 2023-01-27 MED ORDER — PANTOPRAZOLE SODIUM 40 MG IV SOLR
40.0000 mg | Freq: Once | INTRAVENOUS | Status: AC
  Administered 2023-01-27: 40 mg via INTRAVENOUS
  Filled 2023-01-27: qty 10

## 2023-01-27 MED ORDER — ONDANSETRON 4 MG PO TBDP: 4.0000 mg | ORAL_TABLET | Freq: Four times a day (QID) | ORAL | 5 refills | Status: DC | PRN

## 2023-01-27 MED ORDER — PROMETHAZINE HCL 25 MG PO TABS: 25.0000 mg | ORAL_TABLET | Freq: Four times a day (QID) | ORAL | 0 refills | Status: DC | PRN

## 2023-01-27 MED ORDER — LACTATED RINGERS IV BOLUS
1000.0000 mL | Freq: Once | INTRAVENOUS | Status: AC
  Administered 2023-01-27: 1000 mL via INTRAVENOUS

## 2023-01-27 MED ORDER — PROMETHAZINE HCL 25 MG PO TABS: 25.0000 mg | ORAL_TABLET | Freq: Four times a day (QID) | ORAL | 5 refills | Status: DC | PRN

## 2023-01-27 MED ORDER — ONDANSETRON HCL 4 MG/2ML IJ SOLN
4.0000 mg | Freq: Once | INTRAMUSCULAR | Status: AC
  Administered 2023-01-27: 4 mg via INTRAVENOUS
  Filled 2023-01-27: qty 2

## 2023-01-27 NOTE — MAU Note (Signed)
.  Meagan Hunter is a 32 y.o. at [redacted]w[redacted]d here in MAU reporting: N/V and HA. She reports her HA began yesterday. She reports the N/V began this past Saturday. She reports she has an order for Phenergan and has not been able to keep that down. Last dose this past Saturday evening. She reports she has not taken it since then because she has been constantly vomiting. She reports she has tried drinking, eating, doing Pedialyte popsicles, and nothing is helping her. Denies VB, vaginal itching, and vaginal odors.  She reports HG with two of her previous pregnancies. 6 lb weight loss since 11/13. Patient actively vomiting in triage.  Onset of complaint:  Pain score: 7/10 HA  Vitals:   01/27/23 1737  BP: 128/82  Pulse: (!) 129  Resp: 20  Temp: 98.3 F (36.8 C)  SpO2: 98%     FHT: n/a Lab orders placed from triage:  UA

## 2023-01-27 NOTE — MAU Provider Note (Signed)
History     [id number]  Arrival date and time: 01/27/23 1713    Chief Complaint  Patient presents with   Nausea   Emesis   Headache     HPI Meagan Hunter is a 32 y.o. at [redacted]w[redacted]d by 6 wk [place] with PMHx notable for hyperemesis, lupus, anxiety, who presents for nausea and vomiting.   Patient reports that she has a history of severe n/v and hyperemesis in prior pregnancies Reports she last ate or drank anything about two days ago, since then has had constant nausea and vomiting Also having lots of heart burn in her chest and a headache In the past zofran has been helpful but was worried about taking it in the first trimester Has lost almost 3 kg in the past few weeks No vaginal bleeding  Wt Readings from Last 3 Encounters:  01/27/23 58.7 kg  01/08/23 61.4 kg  12/24/22 60.1 kg     --/--/O POS (04/07 1940)  OB History     Gravida  5   Para  3   Term  3   Preterm  0   AB  1   Living  3      SAB  1   IAB  0   Ectopic  0   Multiple  0   Live Births  3           Past Medical History:  Diagnosis Date   Anemia    Anxiety    Asthma    Bipolar 1 disorder, depressed (HCC) 02/01/2016   Bipolar disorder, rapid cycling (HCC) 12/18/2015   Chronic pain syndrome 06/20/2015   Formatting of this note might be different from the original. 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    Past Surgical History:  Procedure Laterality Date   CYST EXCISION     EYE   ESOPHAGOGASTRODUODENOSCOPY      Family History  Problem Relation Age of Onset   Hypertension Mother    Ovarian cancer Mother     Thyroid cancer Mother    Crohn's disease Mother    Hypertension Father    Hyperlipidemia Father    Heart failure Father    Hashimoto's thyroiditis Sister    Breast cancer Maternal Aunt    Heart failure Maternal Grandfather    Heart failure Paternal Grandfather    Colon cancer Paternal Grandfather     Social History   Socioeconomic History   Marital status: Media planner    Spouse name: Not on file   Number of children: 3   Years of education: Not on file   Highest education level: 12th grade  Occupational History   Occupation: stay at home mom  Tobacco Use   Smoking status: Former    Current packs/day: 0.00    Types: Cigarettes    Quit date: 02/26/2015    Years since quitting: 7.9   Smokeless tobacco: Never  Vaping Use   Vaping status: Never Used  Substance and Sexual Activity   Alcohol use: Not Currently    Alcohol/week: 2.0 standard drinks of alcohol    Types:  2 Standard drinks or equivalent per week    Comment: 3 per week   Drug use: Never   Sexual activity: Not Currently    Birth control/protection: None  Other Topics Concern   Not on file  Social History Narrative   Not on file   Social Determinants of Health   Financial Resource Strain: Low Risk  (05/26/2022)   Overall Financial Resource Strain (CARDIA)    Difficulty of Paying Living Expenses: Not hard at all  Food Insecurity: No Food Insecurity (01/08/2023)   Hunger Vital Sign    Worried About Running Out of Food in the Last Year: Never true    Ran Out of Food in the Last Year: Never true  Transportation Needs: No Transportation Needs (01/08/2023)   PRAPARE - Administrator, Civil Service (Medical): No    Lack of Transportation (Non-Medical): No  Physical Activity: Insufficiently Active (01/08/2023)   Exercise Vital Sign    Days of Exercise per Week: 4 days    Minutes of Exercise per Session: 20 min  Stress: No Stress Concern Present (01/08/2023)   [employer] of Occupational  Health - Occupational Stress Questionnaire    Feeling of Stress : Not at all  Social Connections: Moderately Isolated (01/08/2023)   Social Connection and Isolation Panel [NHANES]    Frequency of Communication with Friends and Family: Twice a week    Frequency of Social Gatherings with Friends and Family: Once a week    Attends Religious Services: Never    Database administrator or Organizations: No    Attends Banker Meetings: Never    Marital Status: Living with partner  Intimate Partner Violence: Not At Risk (01/08/2023)   Humiliation, Afraid, Rape, and Kick questionnaire    Fear of Current or Ex-Partner: No    Emotionally Abused: No    Physically Abused: No    Sexually Abused: No    Allergies  Allergen Reactions   Penicillins Hives, Itching, Nausea And Vomiting and Other (See Comments)    Other reaction(s): Fever Fever, rash and vomiting Fever, rash and vomiting Other reaction(s): Fever Fever, rash and vomiting   Zoloft [Sertraline] Other (See Comments)    seizures Other reaction(s): Seizures seizures    No current facility-administered medications on file prior to encounter.   Current Outpatient Medications on File Prior to Encounter  Medication Sig Dispense Refill   Prenatal Vit-Fe Fumarate-FA (MULTIVITAMIN-PRENATAL) 27-0.8 MG TABS tablet Take 1 tablet by mouth daily at 12 noon.     progesterone (PROMETRIUM) 200 MG capsule Place 1 capsule (200 mg total) vaginally daily. 30 capsule 1   VENTOLIN HFA 108 (90 Base) MCG/ACT inhaler Inhale 2 puffs into the lungs every 6 (six) hours as needed. 6.7 g 3     ROS Pertinent positives and negative per HPI, all others reviewed and negative  Physical Exam   BP 128/82 (BP Location: Right Arm)   Pulse (!) 129   Temp 98.3 F (36.8 C) (Oral)   Resp 20   Ht 5\' 4"  (1.626 m)   Wt 58.7 kg   LMP 11/21/2022   SpO2 98%   BMI 22.21 kg/m   Patient Vitals for the past 24 hrs:  BP Temp Temp src Pulse Resp SpO2 Height  Weight  01/27/23 1737 128/82 98.3 F (36.8 C) Oral (!) 129 20 98 % 5\' 4"  (1.626 m) 58.7 kg    Physical Exam Constitutional:      Appearance: She is well-developed.  Comments: Appears ill  HENT:     Head: Normocephalic and atraumatic.  Eyes:     General: No scleral icterus. Pulmonary:     Effort: Pulmonary effort is normal. No respiratory distress.  Skin:    General: Skin is warm and dry.  Neurological:     Mental Status: She is alert.  Psychiatric:        Mood and Affect: Mood normal.      Cervical Exam    Bedside Ultrasound Not performed.  My interpretation: n/a   Labs Results for orders placed or performed during the hospital encounter of 01/27/23 (from the past 24 hour(s))  Urinalysis, Routine w reflex microscopic -Urine, Clean Catch     Status: Abnormal   Collection Time: 01/27/23  6:27 PM  Result Value Ref Range   Color, Urine YELLOW YELLOW   APPearance HAZY (A) CLEAR   Specific Gravity, Urine 1.027 1.005 - 1.030   pH 5.0 5.0 - 8.0   Glucose, UA NEGATIVE NEGATIVE mg/dL   Hgb urine dipstick NEGATIVE NEGATIVE   Bilirubin Urine NEGATIVE NEGATIVE   Ketones, ur 80 (A) NEGATIVE mg/dL   Protein, ur 161 (A) NEGATIVE mg/dL   Nitrite NEGATIVE NEGATIVE   Leukocytes,Ua TRACE (A) NEGATIVE   RBC / HPF 6-10 0 - 5 RBC/hpf   WBC, UA 11-20 0 - 5 WBC/hpf   Bacteria, UA FEW (A) NONE SEEN   Squamous Epithelial / HPF 6-10 0 - 5 /HPF   Mucus PRESENT    Hyaline Casts, UA PRESENT   Comprehensive metabolic panel     Status: Abnormal   Collection Time: 01/27/23  7:36 PM  Result Value Ref Range   Sodium 137 135 - 145 mmol/L   Potassium 3.2 (L) 3.5 - 5.1 mmol/L   Chloride 108 98 - 111 mmol/L   CO2 19 (L) 22 - 32 mmol/L   Glucose, Bld 77 70 - 99 mg/dL   BUN 12 6 - 20 mg/dL   Creatinine, Ser 0.96 0.44 - 1.00 mg/dL   Calcium 8.3 (L) 8.9 - 10.3 mg/dL   Total Protein 6.1 (L) 6.5 - 8.1 g/dL   Albumin 3.3 (L) 3.5 - 5.0 g/dL   AST 20 15 - 41 U/L   ALT 19 0 - 44 U/L   Alkaline  Phosphatase 34 (L) 38 - 126 U/L   Total Bilirubin 0.6 <1.2 mg/dL   GFR, Estimated >04 >54 mL/min   Anion gap 10 5 - 15    Imaging No results found.  MAU Course  Procedures Lab Orders         Urinalysis, Routine w reflex microscopic -Urine, Clean Catch         Comprehensive metabolic panel    Meds ordered this encounter  Medications   ondansetron (ZOFRAN) injection 4 mg   promethazine (PHENERGAN) 25 mg in sodium chloride 0.9 % 1,000 mL infusion   pantoprazole (PROTONIX) injection 40 mg   lactated ringers bolus 1,000 mL   omeprazole (PRILOSEC) 20 MG capsule    Sig: Take 1 capsule (20 mg total) by mouth daily.    Dispense:  90 capsule    Refill:  3   promethazine (PHENERGAN) 25 MG tablet    Sig: Take 1 tablet (25 mg total) by mouth every 6 (six) hours as needed for nausea or vomiting.    Dispense:  30 tablet    Refill:  5   ondansetron (ZOFRAN-ODT) 4 MG disintegrating tablet    Sig: Take 1 tablet (4 mg  total) by mouth every 6 (six) hours as needed for nausea.    Dispense:  20 tablet    Refill:  5   Imaging Orders  No imaging studies ordered today    MDM Moderate (Level 3-4)  Assessment and Plan  #Nausea and vomiting of pregnancy #[redacted] weeks gestation of pregnancy Symptoms improved significantly and able to tolerate PO challenge with above interventions. We discussed low absolute risk increase in cardiac and craniofacial defects with zofran, rx sent to for home use.    Dispo: discharged to home in stable condition   Venora Maples, MD/MPH 01/27/23 8:46 PM  Allergies as of 01/27/2023       Reactions   Penicillins Hives, Itching, Nausea And Vomiting, Other (See Comments)   Other reaction(s): Fever Fever, rash and vomiting Fever, rash and vomiting Other reaction(s): Fever Fever, rash and vomiting   Zoloft [sertraline] Other (See Comments)   seizures Other reaction(s): Seizures seizures        Medication List     TAKE these medications     multivitamin-prenatal 27-0.8 MG Tabs tablet Take 1 tablet by mouth daily at 12 noon.   omeprazole 20 MG capsule Commonly known as: PRILOSEC Take 1 capsule (20 mg total) by mouth daily.   ondansetron 4 MG disintegrating tablet Commonly known as: ZOFRAN-ODT Take 1 tablet (4 mg total) by mouth every 6 (six) hours as needed for nausea.   progesterone 200 MG capsule Commonly known as: PROMETRIUM Place 1 capsule (200 mg total) vaginally daily.   promethazine 25 MG tablet Commonly known as: PHENERGAN Take 1 tablet (25 mg total) by mouth every 6 (six) hours as needed for nausea or vomiting.   Ventolin HFA 108 (90 Base) MCG/ACT inhaler Generic drug: albuterol Inhale 2 puffs into the lungs every 6 (six) hours as needed.

## 2023-01-28 ENCOUNTER — Encounter (HOSPITAL_BASED_OUTPATIENT_CLINIC_OR_DEPARTMENT_OTHER): Payer: Self-pay | Admitting: Obstetrics & Gynecology

## 2023-01-29 ENCOUNTER — Encounter (HOSPITAL_BASED_OUTPATIENT_CLINIC_OR_DEPARTMENT_OTHER): Payer: Self-pay | Admitting: Certified Nurse Midwife

## 2023-01-29 ENCOUNTER — Ambulatory Visit (HOSPITAL_BASED_OUTPATIENT_CLINIC_OR_DEPARTMENT_OTHER): Payer: Medicaid Other | Admitting: Certified Nurse Midwife

## 2023-01-29 VITALS — BP 121/73 | HR 99 | Wt 135.4 lb

## 2023-01-29 DIAGNOSIS — Z3480 Encounter for supervision of other normal pregnancy, unspecified trimester: Secondary | ICD-10-CM

## 2023-01-29 DIAGNOSIS — M329 Systemic lupus erythematosus, unspecified: Secondary | ICD-10-CM

## 2023-01-29 DIAGNOSIS — Z3A09 9 weeks gestation of pregnancy: Secondary | ICD-10-CM

## 2023-01-29 DIAGNOSIS — E876 Hypokalemia: Secondary | ICD-10-CM

## 2023-01-29 MED ORDER — ONDANSETRON 8 MG PO TBDP: 8.0000 mg | ORAL_TABLET | Freq: Three times a day (TID) | ORAL | 5 refills | Status: DC | PRN

## 2023-01-29 NOTE — Progress Notes (Signed)
PRENATAL VISIT NOTE  Subjective:  Meagan Hunter is a 32 y.o. [id number] at [redacted]w[redacted]d being seen today for problem work-in Ob visit to check fetal heart tones. Pt denies vaginal spotting or bleeding. Feels cramping and it is making her nervous b/c she has hx miscarriage earlier this year. Was also noted to have [facility] on early [place]. Pt reports Hx Systemic Lupus but not on meds currently and reports she is currently w/o symptoms.  She reports nausea/vomiting. Having difficulty keeping Promethazine down. May try Zofran. Taking Vitamin B6.    She is currently monitored for the following issues for this high-risk pregnancy and has Lupus (systemic lupus erythematosus) (HCC); ADHD (attention deficit hyperactivity disorder), combined type; Generalized anxiety disorder; Bipolar disorder, current episode mixed, moderate (HCC); and Supervision of other normal pregnancy, antepartum on their problem list.  Patient reports no bleeding.  Contractions: Not present. Vag. Bleeding: None.  Movement: Absent. Denies leaking of fluid.   The following portions of the patient's history were reviewed and updated as appropriate: allergies, current medications, past family history, past medical history, past social history, past surgical history and problem list.   Objective:   Vitals:   01/29/23 0841  BP: 121/73  Pulse: 99  Weight: 135 lb 6.4 oz (61.4 kg)    Fetal Status:     Movement: Absent     General:  Alert, oriented and cooperative. Patient is in no acute distress.  Skin: Skin is warm and dry. No rash noted.   Cardiovascular: Normal heart rate noted  Respiratory: Normal respiratory effort, no problems with respiration noted  Abdomen: Soft, gravid, appropriate for gestational age.  Pain/Pressure: Present (Cramping in abdominal area)     Pelvic: Cervical exam deferred        Extremities: Normal range of motion.  Edema: None  Mental Status: Normal mood and affect. Normal behavior. Normal judgment and thought  content.   Assessment and Plan:  Pregnancy: G5P3013 at [redacted]w[redacted]d 1. [redacted] weeks gestation of pregnancy - FHR 190bpm  2. Supervision of other normal pregnancy, antepartum  3. Systemic lupus erythematosus, unspecified SLE type, unspecified organ involvement status (HCC) - Pt will have Anatomy with MFM at 18-20 weeks, plan MFM consult at that time  4. Low Potassium Level - Repeat in 1 week   Preterm labor symptoms and general obstetric precautions including but not limited to vaginal bleeding, contractions, leaking of fluid and fetal movement were reviewed in detail with the patient. Please refer to After Visit Summary for other counseling recommendations.   No follow-ups on file.  Future Appointments  Date Time Provider Department Center  02/05/2023 11:15 AM Jeret Goyer, Toma Aran, CNM [facility]  03/06/2023  2:35 PM Jerene Bears, MD [facility]  04/07/2023  1:15 PM [facility] NURSE [facility]  04/07/2023  1:30 PM [facility] US3 [facility]  04/08/2023  1:35 PM Jerene Bears, MD [facility]  05/01/2023  2:15 PM Jerene Bears, MD [facility]  05/29/2023  8:35 AM Sherril Shipman, Toma Aran, CNM [facility]  06/23/2023  1:35 PM Merisa Julio, Toma Aran, CNM [facility]  07/07/2023  1:35 PM Kayo Zion, Toma Aran, CNM [facility]  07/17/2023  1:35 PM Jerene Bears, MD [facility]  07/31/2023  1:35 PM Jerene Bears, MD [facility]  08/07/2023  2:15 PM Jerene Bears, MD [facility]  08/14/2023  1:35 PM Jerene Bears, MD [facility]  08/21/2023  1:55 PM Jerene Bears, MD [facility]  08/28/2023  1:15  PM Jerene Bears, MD [facility]    Letta Kocher, CNM

## 2023-02-05 ENCOUNTER — Ambulatory Visit (HOSPITAL_BASED_OUTPATIENT_CLINIC_OR_DEPARTMENT_OTHER): Payer: Medicaid Other | Admitting: Certified Nurse Midwife

## 2023-02-05 ENCOUNTER — Other Ambulatory Visit (HOSPITAL_COMMUNITY)
Admission: RE | Admit: 2023-02-05 | Discharge: 2023-02-05 | Disposition: A | Discharge status: Home / Self Care | Payer: Medicaid Other | Source: Ambulatory Visit | Attending: Certified Nurse Midwife | Admitting: Certified Nurse Midwife

## 2023-02-05 ENCOUNTER — Ambulatory Visit (HOSPITAL_BASED_OUTPATIENT_CLINIC_OR_DEPARTMENT_OTHER): Payer: Medicaid Other | Admitting: Cardiology

## 2023-02-05 VITALS — BP 120/75 | HR 101 | Wt 136.2 lb

## 2023-02-05 DIAGNOSIS — Z3481 Encounter for supervision of other normal pregnancy, first trimester: Secondary | ICD-10-CM | POA: Diagnosis present

## 2023-02-05 DIAGNOSIS — Z3A10 10 weeks gestation of pregnancy: Secondary | ICD-10-CM

## 2023-02-05 DIAGNOSIS — M329 Systemic lupus erythematosus, unspecified: Secondary | ICD-10-CM

## 2023-02-05 MED ORDER — PROGESTERONE 200 MG PO CAPS: 200.0000 mg | ORAL_CAPSULE | Freq: Every day | ORAL | 5 refills | Status: DC

## 2023-02-05 NOTE — Progress Notes (Unsigned)
Pap 06/05/2020 Negative    INITIAL PRENATAL VISIT  FHR checked with transabdominal US. Single viable IUP with +cardiac activity, active fetus.   Subjective:   Meagan Hunter is being seen today for her first obstetrical visit.  This is a planned pregnancy. This is a desired pregnancy.  She is at [redacted]w[redacted]d gestation by LMP.  Her obstetrical history is significant for  HG in prior pregnancy . Relationship with FOB: significant other, living together. Patient does intend to breast feed. Pregnancy history fully reviewed.  Patient reports  doing better with  nausea/vomiting due to Zofran. Taking Colace and Miralax for constipation r/t Zofran .  Indications for ASA therapy (per uptodate) One of the following: Previous pregnancy with preeclampsia, especially early onset and with an adverse outcome No Multifetal gestation No Chronic hypertension No Type 1 or 2 diabetes mellitus No Chronic kidney disease No Autoimmune disease (antiphospholipid syndrome, systemic lupus erythematosus) Yes  Two or more of the following: Nulliparity No Obesity (body mass index >30 kg/m2) No Family history of preeclampsia in mother or sister No Age >=35 years No Sociodemographic characteristics (African American race, low socioeconomic level) Yes Personal risk factors (eg, previous pregnancy with low birth weight or small for gestational age infant, previous adverse pregnancy outcome [eg, stillbirth], interval >10 years between pregnancies) No  Early screening tests: FBS, A1C, Random CBG, glucose challenge   Review of Systems:   Review of Systems +nausea, taking Zofran + fatigue No vaginal spotting or bleeding  Objective:    Obstetric History OB History  Gravida Para Term Preterm AB Living  5 3 3  0 1 3  SAB IAB Ectopic Multiple Live Births  1 0 0 0 3    # Outcome Date GA Lbr Len/2nd Weight Sex Type Anes PTL Lv  5 Current           4 SAB 04/2022          3 Term 12/13/20   7 lb 6.2 oz (3.35 kg) M  Vag-Spont   LIV  2 Term 08/08/14   7 lb 9.7 oz (3.45 kg) M Vag-Spont   LIV  1 Term 01/14/11   7 lb (3.175 kg) F Vag-Forceps   LIV    Past Medical History:  Diagnosis Date   Anemia    Anxiety    Asthma    Bipolar 1 disorder, depressed (HCC) 02/01/2016   Bipolar disorder, rapid cycling (HCC) 12/18/2015   Chronic pain syndrome 06/20/2015   Formatting of this note might be different from the original. 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 90 capsule 3   ondansetron (ZOFRAN-ODT) 8 MG disintegrating tablet Take 1 tablet (8 mg total) by mouth every 8 (eight) hours as needed for nausea or vomiting. 20 tablet 5   Prenatal  Vit-Fe Fumarate-FA (MULTIVITAMIN-PRENATAL) 27-0.8 MG TABS tablet Take 1 tablet by mouth daily at 12 noon.     promethazine (PHENERGAN) 25 MG tablet Take 1 tablet (25 mg total) by mouth every 6 (six) hours as needed for nausea or vomiting. 30 tablet 5   VENTOLIN HFA 108 (90 Base) MCG/ACT inhaler Inhale 2 puffs into the lungs every 6 (six) hours as needed. 6.7 g 3   ondansetron (ZOFRAN-ODT) 4 MG disintegrating tablet Take 1 tablet (4 mg total) by mouth every 6 (six) hours as needed for nausea. (Patient not taking: Reported on 01/29/2023) 20 tablet 5   No current facility-administered medications on file prior to visit.     Allergies  Allergen Reactions   Penicillins Hives, Itching, Nausea And Vomiting and Other (See Comments)    Other reaction(s): Fever Fever, rash and vomiting Fever, rash and vomiting Other reaction(s): Fever Fever, rash and vomiting   Zoloft [Sertraline] Other (See Comments)    seizures Other reaction(s): Seizures seizures    Social History:  reports that she quit smoking about 7 years ago. Her smoking use included cigarettes. She has never used smokeless tobacco. She reports that she does not currently use alcohol after a past usage of about 2.0 standard drinks of alcohol per week. She reports that she does not use drugs.  Family History  Problem Relation Age of Onset   Hypertension Mother    Ovarian cancer Mother    Thyroid cancer Mother    Crohn's disease Mother    Hypertension Father    Hyperlipidemia Father    Heart failure Father    Hashimoto's thyroiditis Sister    Breast cancer Maternal Aunt    Heart failure Maternal Grandfather    Heart failure Paternal Grandfather    Colon cancer Paternal Grandfather     The following portions of the patient's history were reviewed and updated as appropriate: allergies, current medications, past family history, past medical history, past social history, past surgical history and problem list.  Review of Systems Review of Systems    Physical Exam:  BP 120/75   Pulse (!) 101   Wt 136 lb 3.2 oz (61.8 kg)   LMP 11/21/2022   BMI 23.38 kg/m  CONSTITUTIONAL: Well-developed, well-nourished female in no acute distress.  HENT:  Normocephalic, atraumatic.  Oropharynx is clear and moist EYES: Conjunctivae normal. No scleral icterus.  NECK: Normal range of motion, supple, no masses.  Normal thyroid.  SKIN: Skin is warm and dry. No rash noted. Not diaphoretic. No erythema. No pallor. MUSCULOSKELETAL: Normal range of motion. No tenderness.  No cyanosis, clubbing, or edema.   NEUROLOGIC: Alert and oriented to person, place, and  time. Normal muscle tone coordination.  PSYCHIATRIC: Normal mood and affect. Normal behavior. Normal judgment and thought content. CARDIOVASCULAR: Normal heart rate noted, regular rhythm RESPIRATORY: Clear to auscultation bilaterally. Effort and breath sounds normal, no problems with respiration noted. BREASTS: Symmetric in size. No masses, skin changes, nipple drainage, or lymphadenopathy. ABDOMEN: Soft, normal bowel sounds, no distention noted.  No tenderness, rebound or guarding. PELVIC: Normal appearing external genitalia; normal appearing vaginal mucosa and cervix.  No abnormal discharge noted.  Pap smear not due-not obtained.  Normal uterine size, no other palpable masses, no uterine or adnexal tenderness.      Movement: Absent       Assessment:    Pregnancy: G5P3013  1. Encounter for supervision of other normal pregnancy in first trimester (Primary) - HgA1C-normal - ABO/Rh - Antibody screen - CBC -  Hepatitis B surface antigen - HIV Antibody (routine testing w rflx) - HIV (Save tube for possible reflex) - RPR - Rubella screen - Hepatitis C antibody - Culture, OB Urine - Cervicovaginal ancillary only( [place]) - PANORAMA PRENATAL TEST - Hemoglobin A1c  2. Systemic lupus erythematosus, unspecified SLE type, unspecified organ involvement status (HCC) - Pt does not desire referral to Rheumatology at this time and does not want meds at this time - ASA 81mg  po once dail - Monitor throughout pregnancy   Plan:  ASA 81mg  po once daily Blood Type O+  Initial labs drawn. Prenatal vitamins. Problem list reviewed and updated. Reviewed in detail the nature of the practice with collaborative care between  Genetic screening discussed: NIPS/First trimester screen/Quad/AFP requested. Role of ultrasound in pregnancy discussed; Anatomy US: requested. Amniocentesis discussed: not indicated. Follow up in 4 weeks. Discussed clinic routines, schedule of care and testing, genetic  screening options, involvement of students and residents under the direct supervision of APPs and doctors and presence of female providers. Pt verbalized understanding.   Letta Kocher, CNM 02/06/2023 8:29 AM

## 2023-02-06 ENCOUNTER — Encounter (HOSPITAL_BASED_OUTPATIENT_CLINIC_OR_DEPARTMENT_OTHER): Payer: Self-pay | Admitting: Certified Nurse Midwife

## 2023-02-06 LAB — HEMOGLOBIN A1C
Est. average glucose Bld gHb Est-mCnc: 94 mg/dL
Hgb A1c MFr Bld: 4.9 % (ref 4.8–5.6)

## 2023-02-06 LAB — CBC
Hematocrit: 37.2 % (ref 34.0–46.6)
Hemoglobin: 12.4 g/dL (ref 11.1–15.9)
MCH: 31.4 pg (ref 26.6–33.0)
MCHC: 33.3 g/dL (ref 31.5–35.7)
MCV: 94 fL (ref 79–97)
Platelets: 226 10*3/uL (ref 150–450)
RBC: 3.95 x10E6/uL (ref 3.77–5.28)
RDW: 12.4 % (ref 11.7–15.4)
WBC: 8.7 10*3/uL (ref 3.4–10.8)

## 2023-02-06 LAB — CERVICOVAGINAL ANCILLARY ONLY
Chlamydia: NEGATIVE
Comment: NEGATIVE
Comment: NORMAL
Neisseria Gonorrhea: NEGATIVE

## 2023-02-06 LAB — RUBELLA SCREEN: Rubella Antibodies, IGG: 8.7 {index} (ref >0.99)

## 2023-02-06 LAB — RPR: RPR Ser Ql: NONREACTIVE

## 2023-02-06 LAB — ABO/RH: Rh Factor: POSITIVE

## 2023-02-06 LAB — ANTIBODY SCREEN: Antibody Screen: NEGATIVE

## 2023-02-06 LAB — HEPATITIS B SURFACE ANTIGEN: Hepatitis B Surface Ag: NEGATIVE

## 2023-02-06 LAB — HEPATITIS C ANTIBODY: Hep C Virus Ab: NONREACTIVE

## 2023-02-06 LAB — HIV ANTIBODY (ROUTINE TESTING W REFLEX): HIV Screen 4th Generation wRfx: NONREACTIVE

## 2023-02-06 MED ORDER — ASPIRIN 81 MG PO TBEC: 81.0000 mg | DELAYED_RELEASE_TABLET | Freq: Every day | ORAL | 12 refills | Status: DC

## 2023-02-07 LAB — URINE CULTURE, OB REFLEX: Organism ID, Bacteria: NO GROWTH

## 2023-02-07 LAB — CULTURE, OB URINE

## 2023-02-10 ENCOUNTER — Other Ambulatory Visit (HOSPITAL_BASED_OUTPATIENT_CLINIC_OR_DEPARTMENT_OTHER): Payer: Self-pay | Admitting: *Deleted

## 2023-02-10 ENCOUNTER — Encounter (HOSPITAL_BASED_OUTPATIENT_CLINIC_OR_DEPARTMENT_OTHER): Payer: Self-pay | Admitting: Certified Nurse Midwife

## 2023-02-10 MED ORDER — BLOOD PRESSURE KIT DEVI: 1.0000 | Freq: Once | 0 refills | Status: AC

## 2023-02-13 LAB — PANORAMA PRENATAL TEST FULL PANEL:PANORAMA TEST PLUS 5 ADDITIONAL MICRODELETIONS: FETAL FRACTION: 13.6

## 2023-02-25 ENCOUNTER — Encounter (HOSPITAL_BASED_OUTPATIENT_CLINIC_OR_DEPARTMENT_OTHER): Payer: Self-pay | Admitting: Obstetrics & Gynecology

## 2023-02-26 ENCOUNTER — Emergency Department (HOSPITAL_BASED_OUTPATIENT_CLINIC_OR_DEPARTMENT_OTHER)
Admission: EM | Admit: 2023-02-26 | Discharge: 2023-02-27 | Disposition: A | Discharge status: Home / Self Care | Payer: Medicaid Other | Attending: Emergency Medicine | Admitting: Emergency Medicine

## 2023-02-26 ENCOUNTER — Emergency Department (HOSPITAL_BASED_OUTPATIENT_CLINIC_OR_DEPARTMENT_OTHER): Payer: Medicaid Other

## 2023-02-26 ENCOUNTER — Emergency Department (HOSPITAL_BASED_OUTPATIENT_CLINIC_OR_DEPARTMENT_OTHER): Payer: Medicaid Other | Admitting: Radiology

## 2023-02-26 ENCOUNTER — Other Ambulatory Visit: Payer: Self-pay

## 2023-02-26 DIAGNOSIS — Z3A13 13 weeks gestation of pregnancy: Secondary | ICD-10-CM | POA: Insufficient documentation

## 2023-02-26 DIAGNOSIS — O99281 Endocrine, nutritional and metabolic diseases complicating pregnancy, first trimester: Secondary | ICD-10-CM | POA: Insufficient documentation

## 2023-02-26 DIAGNOSIS — Z20822 Contact with and (suspected) exposure to covid-19: Secondary | ICD-10-CM | POA: Insufficient documentation

## 2023-02-26 DIAGNOSIS — J40 Bronchitis, not specified as acute or chronic: Secondary | ICD-10-CM | POA: Insufficient documentation

## 2023-02-26 DIAGNOSIS — O99511 Diseases of the respiratory system complicating pregnancy, first trimester: Secondary | ICD-10-CM | POA: Insufficient documentation

## 2023-02-26 DIAGNOSIS — Z87891 Personal history of nicotine dependence: Secondary | ICD-10-CM | POA: Diagnosis not present

## 2023-02-26 DIAGNOSIS — E876 Hypokalemia: Secondary | ICD-10-CM | POA: Diagnosis not present

## 2023-02-26 DIAGNOSIS — O26891 Other specified pregnancy related conditions, first trimester: Secondary | ICD-10-CM | POA: Diagnosis present

## 2023-02-26 DIAGNOSIS — Z7982 Long term (current) use of aspirin: Secondary | ICD-10-CM | POA: Diagnosis not present

## 2023-02-26 DIAGNOSIS — R042 Hemoptysis: Secondary | ICD-10-CM

## 2023-02-26 LAB — CBC WITH DIFFERENTIAL/PLATELET
Abs Immature Granulocytes: 0.02 10*3/uL (ref 0.00–0.07)
Basophils Absolute: 0 10*3/uL (ref 0.0–0.1)
Basophils Relative: 0 %
Eosinophils Absolute: 0.1 10*3/uL (ref 0.0–0.5)
Eosinophils Relative: 1 %
HCT: 34.7 % — ABNORMAL LOW (ref 36.0–46.0)
Hemoglobin: 12.6 g/dL (ref 12.0–15.0)
Immature Granulocytes: 0 %
Lymphocytes Relative: 25 %
Lymphs Abs: 2.3 10*3/uL (ref 0.7–4.0)
MCH: 32.1 pg (ref 26.0–34.0)
MCHC: 36.3 g/dL — ABNORMAL HIGH (ref 30.0–36.0)
MCV: 88.3 fL (ref 80.0–100.0)
Monocytes Absolute: 0.4 10*3/uL (ref 0.1–1.0)
Monocytes Relative: 5 %
Neutro Abs: 6.1 10*3/uL (ref 1.7–7.7)
Neutrophils Relative %: 69 %
Platelets: 197 10*3/uL (ref 150–400)
RBC: 3.93 MIL/uL (ref 3.87–5.11)
RDW: 12.1 % (ref 11.5–15.5)
WBC: 8.9 10*3/uL (ref 4.0–10.5)
nRBC: 0 % (ref 0.0–0.2)

## 2023-02-26 LAB — BASIC METABOLIC PANEL
Anion gap: 8 (ref 5–15)
BUN: 9 mg/dL (ref 6–20)
CO2: 21 mmol/L — ABNORMAL LOW (ref 22–32)
Calcium: 8.6 mg/dL — ABNORMAL LOW (ref 8.9–10.3)
Chloride: 106 mmol/L (ref 98–111)
Creatinine, Ser: 0.57 mg/dL (ref 0.44–1.00)
GFR, Estimated: >60 mL/min (ref >60)
Glucose, Bld: 96 mg/dL (ref 70–99)
Potassium: 3.3 mmol/L — ABNORMAL LOW (ref 3.5–5.1)
Sodium: 135 mmol/L (ref 135–145)

## 2023-02-26 LAB — D-DIMER, QUANTITATIVE: D-Dimer, Quant: 0.73 ug{FEU}/mL — ABNORMAL HIGH (ref 0.00–0.50)

## 2023-02-26 LAB — RESP PANEL BY RT-PCR (RSV, FLU A&B, COVID)  RVPGX2
Influenza A by PCR: NEGATIVE
Influenza B by PCR: NEGATIVE
Resp Syncytial Virus by PCR: NEGATIVE
SARS Coronavirus 2 by RT PCR: NEGATIVE

## 2023-02-26 LAB — HCG, QUANTITATIVE, PREGNANCY: hCG, Beta Chain, Quant, S: 66607 m[IU]/mL — ABNORMAL HIGH (ref <5)

## 2023-02-26 LAB — BRAIN NATRIURETIC PEPTIDE: B Natriuretic Peptide: 25.4 pg/mL (ref 0.0–100.0)

## 2023-02-26 MED ORDER — IOHEXOL 350 MG/ML SOLN
100.0000 mL | Freq: Once | INTRAVENOUS | Status: AC | PRN
  Administered 2023-02-26: 75 mL via INTRAVENOUS

## 2023-02-26 MED ORDER — ALBUTEROL SULFATE HFA 108 (90 BASE) MCG/ACT IN AERS: 2.0000 | INHALATION_SPRAY | RESPIRATORY_TRACT | Status: DC | PRN

## 2023-02-26 NOTE — ED Provider Notes (Addendum)
 [facility] Provider Note   CSN: [id number] Arrival date & time: 02/26/23  1730     History  Chief Complaint  Patient presents with   Shortness of Breath    Meagan Hunter is a 33 y.o. female.  Patient is [redacted] weeks pregnant.  Her due date is August 28, 2023.  Patient is gravida 5 para 3.  Patient starting and beginning in December had difficulty with upper respiratory infection.  She has a history of lupus.  Patient has been coughing up some phlegm but here more recently today just started coughing up blood with clots.  Because.  About a half a cup already today.  No vaginal bleeding.  Patient was concerned about pneumonia she was concerned about a blood clot.  Heart rate here was 120 temp 98 blood pressure 128/80 oxygen sats are very good 100% on room air.  Respiratory rate 18.  Patient has had some mild calf discomfort recently.  But no significant leg swelling.  Patient does use albuterol  inhalers at home and she has been doing that.  Past medical history significant for bipolar disorder history of lupus gastroesophageal reflux disease chronic pain syndrome document 2017 tachycardia noted in April 2024.  Patient is a former smoker quit in 2017.       Home Medications Prior to Admission medications   Medication Sig Start Date End Date Taking? Authorizing Provider  aspirin  EC 81 MG tablet Take 1 tablet (81 mg total) by mouth daily. Swallow whole. 02/06/23   Lo, Arland POUR, CNM  omeprazole  (PRILOSEC) 20 MG capsule Take 1 capsule (20 mg total) by mouth daily. 01/27/23   Lola Donnice HERO, MD  ondansetron  (ZOFRAN -ODT) 4 MG disintegrating tablet Take 1 tablet (4 mg total) by mouth every 6 (six) hours as needed for nausea. Patient not taking: Reported on 01/29/2023 01/27/23   Lola Donnice HERO, MD  ondansetron  (ZOFRAN -ODT) 8 MG disintegrating tablet Take 1 tablet (8 mg total) by mouth every 8 (eight) hours as needed for nausea or vomiting. 01/29/23   Lo,  Arland POUR, CNM  Prenatal Vit-Fe Fumarate-FA (MULTIVITAMIN-PRENATAL) 27-0.8 MG TABS tablet Take 1 tablet by mouth daily at 12 noon.    [provider]  progesterone  (PROMETRIUM ) 200 MG capsule Place 1 capsule (200 mg total) vaginally daily. 02/05/23   Lo, Arland POUR, CNM  promethazine  (PHENERGAN ) 25 MG tablet Take 1 tablet (25 mg total) by mouth every 6 (six) hours as needed for nausea or vomiting. 01/27/23   Lola Donnice HERO, MD  VENTOLIN  HFA 108 [phone] Base) MCG/ACT inhaler Inhale 2 puffs into the lungs every 6 (six) hours as needed. 10/03/22   Lucius Krabbe, NP      Allergies    Penicillins and Zoloft [sertraline]    Review of Systems   Review of Systems  Constitutional:  Negative for chills and fever.  HENT:  Positive for congestion. Negative for ear pain and sore throat.   Eyes:  Negative for pain and visual disturbance.  Respiratory:  Positive for cough and shortness of breath.   Cardiovascular:  Negative for chest pain, palpitations and leg swelling.  Gastrointestinal:  Negative for abdominal pain, diarrhea, nausea and vomiting.  Genitourinary:  Negative for dysuria and hematuria.  Musculoskeletal:  Negative for arthralgias and back pain.  Skin:  Negative for color change and rash.  Neurological:  Negative for seizures and syncope.  All other systems reviewed and are negative.   Physical Exam Updated Vital Signs BP 104/66 (  BP Location: Left Arm)   Pulse 84   Temp 98.3 F (36.8 C) (Oral)   Resp 16   Wt 61.2 kg   LMP 11/21/2022   SpO2 100%   BMI 23.17 kg/m  Physical Exam Vitals and nursing note reviewed.  Constitutional:      General: She is not in acute distress.    Appearance: Normal appearance. She is well-developed. She is not ill-appearing.  HENT:     Head: Normocephalic and atraumatic.     Mouth/Throat:     Mouth: Mucous membranes are moist.  Eyes:     Extraocular Movements: Extraocular movements intact.     Conjunctiva/sclera: Conjunctivae normal.      Pupils: Pupils are equal, round, and reactive to light.  Cardiovascular:     Rate and Rhythm: Normal rate and regular rhythm.     Heart sounds: No murmur heard. Pulmonary:     Effort: Pulmonary effort is normal. No respiratory distress.     Breath sounds: Normal breath sounds. No stridor. No wheezing, rhonchi or rales.  Abdominal:     Palpations: Abdomen is soft.     Tenderness: There is no abdominal tenderness.  Musculoskeletal:        General: No swelling.     Cervical back: Normal range of motion and neck supple.     Right lower leg: No edema.     Left lower leg: No edema.  Skin:    General: Skin is warm and dry.     Capillary Refill: Capillary refill takes less than 2 seconds.  Neurological:     General: No focal deficit present.     Mental Status: She is alert and oriented to person, place, and time.     Cranial Nerves: No cranial nerve deficit.     Sensory: No sensory deficit.     Motor: No weakness.  Psychiatric:        Mood and Affect: Mood normal.     ED Results / Procedures / Treatments   Labs (all labs ordered are listed, but only abnormal results are displayed) Labs Reviewed  BASIC METABOLIC PANEL - Abnormal; Notable for the following components:      Result Value   Potassium 3.3 (*)    CO2 21 (*)    Calcium 8.6 (*)    All other components within normal limits  CBC WITH DIFFERENTIAL/PLATELET - Abnormal; Notable for the following components:   HCT 34.7 (*)    MCHC 36.3 (*)    All other components within normal limits  D-DIMER, QUANTITATIVE - Abnormal; Notable for the following components:   D-Dimer, Quant 0.73 (*)    All other components within normal limits  HCG, QUANTITATIVE, PREGNANCY - Abnormal; Notable for the following components:   hCG, Beta Chain, Earleen RAMAN 33,392 (*)    All other components within normal limits  RESP PANEL BY RT-PCR (RSV, FLU A&B, COVID)  RVPGX2  BRAIN NATRIURETIC PEPTIDE    EKG EKG Interpretation Date/Time:  Wednesday February 26 2023 17:47:19 EST Ventricular Rate:  109 PR Interval:  130 QRS Duration:  74 QT Interval:  360 QTC Calculation: 484 R Axis:   79  Text Interpretation: Sinus tachycardia Possible Left atrial enlargement Nonspecific T wave abnormality Abnormal ECG When compared with ECG of 02-Jun-2022 12:05, Non-specific change in ST segment in Lateral leads Nonspecific T wave abnormality now evident in Inferior leads Nonspecific T wave abnormality now evident in Lateral leads QT has lengthened Confirmed by Selina Tapper [phone]) on  02/26/2023 8:45:10 PM  Radiology CT Angio Chest PE W/Cm &/Or Wo Cm Result Date: 02/26/2023 CLINICAL DATA:  Pulmonary embolism (PE) suspected, pregnant Patient is [redacted] weeks pregnant. Cough. EXAM: CT ANGIOGRAPHY CHEST WITH CONTRAST TECHNIQUE: Multidetector CT imaging of the chest was performed using the standard protocol during bolus administration of intravenous contrast. Multiplanar CT image reconstructions and MIPs were obtained to evaluate the vascular anatomy. RADIATION DOSE REDUCTION: This exam was performed according to the departmental dose-optimization program which includes automated exposure control, adjustment of the mA and/or kV according to patient size and/or use of iterative reconstruction technique. CONTRAST:  75mL OMNIPAQUE  IOHEXOL  350 MG/ML SOLN COMPARISON:  Radiograph earlier today FINDINGS: Cardiovascular: There are no filling defects within the pulmonary arteries to suggest pulmonary embolus. The thoracic aorta is normal in caliber without acute aortic findings. The heart is normal in size. No pericardial effusion. Mediastinum/Nodes: No enlarged mediastinal or hilar lymph nodes. No thyroid nodule. Unremarkable appearance of the esophagus. Lungs/Pleura: Clear lungs. No focal airspace disease. No pleural effusion. No features of pulmonary edema. Upper Abdomen: No acute findings. Musculoskeletal: There are no acute or suspicious osseous abnormalities. Review of the MIP images  confirms the above findings. IMPRESSION: No pulmonary embolus or acute intrathoracic abnormality. Electronically Signed   By: Andrea Gasman M.D.   On: 02/26/2023 22:43   DG Chest 2 View Result Date: 02/26/2023 CLINICAL DATA:  Shortness of breath EXAM: CHEST - 2 VIEW COMPARISON:  None Available. FINDINGS: The heart size and mediastinal contours are within normal limits. Both lungs are clear. The visualized skeletal structures are unremarkable. IMPRESSION: No active cardiopulmonary disease. Electronically Signed   By: Oneil Devonshire M.D.   On: 02/26/2023 19:59    Procedures Procedures    Medications Ordered in ED Medications  albuterol  (VENTOLIN  HFA) 108 (90 Base) MCG/ACT inhaler 2 puff (has no administration in time range)  iohexol  (OMNIPAQUE ) 350 MG/ML injection 100 mL (75 mLs Intravenous Contrast Given 02/26/23 2225)    ED Course/ Medical Decision Making/ A&P                                 Medical Decision Making Amount and/or Complexity of Data Reviewed Labs: ordered. Radiology: ordered.  Risk Prescription drug management.   Patient with fairly significant hemoptysis.  But not hypoxic.  Patient's D-dimer was elevated at 0.7 often elevated during pregnancy but patient was concerned about blood clots.  Also had a little bit of some calf discomfort recently no significant leg swelling.  Patient is [redacted] weeks pregnant.  Basic metabolic panel negative other than potassium of 3.3 renal functions normal CBC white count 8.9 hemoglobin 12.6 platelets 197 BNP 25 D-dimer 0.73 is mention respiratory panel negative for COVID flu and RSV chest x-ray read is completely normal.  Patient's lungs are clear bilaterally she is not hypoxic.  Long conversation with patient she wants to go ahead and do the CT angio to rule out PE because she had concern for that also concerns for pneumonia.  But regular chest rate not showing any pneumonia CT angio will give a better look at the lungs as well.  Patient can  be shielded.  The meantime we will document where her quantitative hCG is currently and we will also get fetal heart tones as well.  Have ordered CT angio to rule out PE.  CT angio without evidence of pulmonary embolus no evidence of any pneumonia.  This seems to  be consistent with a bronchitis.  Patient hemodynamically stable here.  Patient feels very reassured by the CT being negative. Final Clinical Impression(s) / ED Diagnoses Final diagnoses:  Cough with hemoptysis  [redacted] weeks gestation of pregnancy  Bronchitis    Rx / DC Orders ED Discharge Orders     None         Geraldene Hamilton, MD 02/26/23 2131    Geraldene Hamilton, MD 02/27/23 9985    Geraldene Hamilton, MD 02/27/23 [phone]

## 2023-02-26 NOTE — ED Triage Notes (Signed)
 [redacted] WEEKS pregnant, has been around sick contacts. Pt has lupus and asthma. Pt woke up this am and was coughing up blood. Showed a picture of blood clot she coughed up. Subjective mild fevers ,says she is short of breath at times.

## 2023-02-26 NOTE — L&D Delivery Note (Signed)
 LABOR COURSE Patient is a 33 yo F who initially presented for IOL SLE/ mixed connective tissue disease. S/p AROM and pitocin  during induction.   Delivery Note Called to room and patient was complete and pushing. Head delivered without complication. 1 loose nuchal cord present, easily reduced following delivery of the body. Shoulder and body delivered in usual fashion.   At 1809 a viable female was delivered via Vaginal, Spontaneous (Presentation: ROA).  Infant with spontaneous cry, placed on mother's abdomen, dried and stimulated. Cord clamped x 2 after >1 minute delay, and cut by FOB. Cord blood drawn. Placenta delivered spontaneously with gentle cord traction. Appears to be circumvallate placenta and intact on inspection. Family prefers to take placenta home. Fundus firm with massage and Pitocin . Labia, perineum, vagina, and cervix inspected with 1st degree laceration. Hemostatic and re-approximated following repair. Mother and baby doing well following delivery.   APGAR: 9 at 1 min, 9 at 5 min Cord: 3VC without complication   Anesthesia: Epidural Episiotomy: None Lacerations: 1st degree laceration  Suture Repair: 3.0 vicryl Est. Blood Loss (mL): 300 cc  Mom to postpartum.  Baby to Couplet care / Skin to Skin.  Claudene Wells LABOR, MD 08/22/2023 6:26 PM

## 2023-02-27 ENCOUNTER — Ambulatory Visit: Payer: Medicaid Other | Admitting: Family

## 2023-02-27 NOTE — Discharge Instructions (Addendum)
 CT angio chest without any acute findings no evidence of pulmonary embolus or pneumonia.  Patient has significant allergies to penicillin.  So we will hold off on treating her with ampicillin other antibiotics may not be best during pregnancy.  This is most likely a bronchitis.  No evidence of any significant infection no evidence of any pulmonary embolus.  Recommend that she follow-up with her primary care doctor as well as her OB/GYN.  Patient will return for any significant coughing up blood which is a cooking cup accumulation in a day.

## 2023-02-27 NOTE — Progress Notes (Deleted)
 Patient ID: Meagan Hunter, female    DOB: 19-Jan-1991, 33 y.o.   MRN: [id number]  No chief complaint on file. [redacted] weeks pregnant - seen in ER 1/1 - all test neg      Assessment & Plan:   Subjective:    Outpatient Medications Prior to Visit  Medication Sig Dispense Refill   aspirin  EC 81 MG tablet Take 1 tablet (81 mg total) by mouth daily. Swallow whole. 100 tablet 12   omeprazole  (PRILOSEC) 20 MG capsule Take 1 capsule (20 mg total) by mouth daily. 90 capsule 3   ondansetron  (ZOFRAN -ODT) 4 MG disintegrating tablet Take 1 tablet (4 mg total) by mouth every 6 (six) hours as needed for nausea. (Patient not taking: Reported on 01/29/2023) 20 tablet 5   ondansetron  (ZOFRAN -ODT) 8 MG disintegrating tablet Take 1 tablet (8 mg total) by mouth every 8 (eight) hours as needed for nausea or vomiting. 20 tablet 5   Prenatal Vit-Fe Fumarate-FA (MULTIVITAMIN-PRENATAL) 27-0.8 MG TABS tablet Take 1 tablet by mouth daily at 12 noon.     progesterone  (PROMETRIUM ) 200 MG capsule Place 1 capsule (200 mg total) vaginally daily. 30 capsule 5   promethazine  (PHENERGAN ) 25 MG tablet Take 1 tablet (25 mg total) by mouth every 6 (six) hours as needed for nausea or vomiting. 30 tablet 5   VENTOLIN  HFA 108 (90 Base) MCG/ACT inhaler Inhale 2 puffs into the lungs every 6 (six) hours as needed. 6.7 g 3   No facility-administered medications prior to visit.   Past Medical History:  Diagnosis Date   Anemia    Anxiety    Asthma    Bipolar 1 disorder, depressed (HCC) 02/01/2016   Bipolar disorder, rapid cycling (HCC) 12/18/2015   Chronic pain syndrome 06/20/2015   Formatting of this note might be different from the original. 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  Constitutional:      Appearance: Normal appearance. She is ill-appearing.     Interventions: Face mask in place.  HENT:     Right Ear: Tympanic membrane and ear canal normal.     Left Ear: Tympanic membrane and ear canal normal.     Nose:     Right Sinus: Frontal sinus tenderness present.     Left Sinus: Frontal sinus tenderness present.     Mouth/Throat:     Mouth: Mucous membranes are moist.     Pharynx: Posterior oropharyngeal erythema present. No pharyngeal swelling, oropharyngeal exudate or uvula swelling.     Tonsils: No tonsillar exudate or tonsillar abscesses.  Cardiovascular:  Rate and Rhythm: Normal rate and regular rhythm.  Pulmonary:     Effort: Pulmonary effort is normal.     Breath sounds: Normal breath sounds.  Musculoskeletal:        General: Normal range of motion.  Lymphadenopathy:     Head:     Right side of head: No preauricular or posterior auricular adenopathy.     Left side of head: No preauricular or posterior auricular adenopathy.     Cervical: No cervical adenopathy.  Skin:    General: Skin is warm and dry.  Neurological:     Mental Status: She is alert.  Psychiatric:        Mood and Affect: Mood normal.        Behavior: Behavior normal.    LMP 11/21/2022  Wt Readings from Last 3 Encounters:  02/26/23 135 lb  (61.2 kg)  02/05/23 136 lb 3.2 oz (61.8 kg)  01/29/23 135 lb 6.4 oz (61.4 kg)       Lucius Krabbe, NP

## 2023-03-06 ENCOUNTER — Ambulatory Visit (INDEPENDENT_AMBULATORY_CARE_PROVIDER_SITE_OTHER): Payer: Medicaid Other | Admitting: Obstetrics & Gynecology

## 2023-03-06 VITALS — BP 126/79 | HR 98 | Wt 140.8 lb

## 2023-03-06 DIAGNOSIS — Z8759 Personal history of other complications of pregnancy, childbirth and the puerperium: Secondary | ICD-10-CM

## 2023-03-06 DIAGNOSIS — Z3A15 15 weeks gestation of pregnancy: Secondary | ICD-10-CM | POA: Diagnosis not present

## 2023-03-06 DIAGNOSIS — E876 Hypokalemia: Secondary | ICD-10-CM

## 2023-03-06 DIAGNOSIS — Z3480 Encounter for supervision of other normal pregnancy, unspecified trimester: Secondary | ICD-10-CM | POA: Diagnosis not present

## 2023-03-06 NOTE — Progress Notes (Signed)
   PRENATAL VISIT NOTE  Subjective:  Meagan Hunter is a 33 y.o. [phone] at [redacted]w[redacted]d being seen today for ongoing prenatal care.  She is currently monitored for the following issues for this low-risk pregnancy and has Lupus (systemic lupus erythematosus) (HCC); ADHD (attention deficit hyperactivity disorder), combined type; Generalized anxiety disorder; Bipolar disorder, current episode mixed, moderate (HCC); and Supervision of other normal pregnancy, antepartum on their problem list.  Patient reports  she is feeling much better.  Nausea is a lot of better.  Still having occasional emesis.  Threw up yesterday but this is infequent .  Contractions: Not present. Vag. Bleeding: None.  Movement: Absent. Denies leaking of fluid.   The following portions of the patient's history were reviewed and updated as appropriate: allergies, current medications, past family history, past medical history, past social history, past surgical history and problem list.   Objective:   Vitals:   03/06/23 1451  BP: 126/79  Pulse: 98  Weight: 140 lb 12.8 oz (63.9 kg)    Fetal Status: Fetal Heart Rate (bpm): 146   Movement: Absent     General:  Alert, oriented and cooperative. Patient is in no acute distress.  Skin: Skin is warm and dry. No rash noted.   Cardiovascular: Normal heart rate noted  Respiratory: Normal respiratory effort, no problems with respiration noted  Abdomen: Soft, gravid, appropriate for gestational age.  Pain/Pressure: Absent     Pelvic: Cervical exam deferred        Extremities: Normal range of motion.  Edema: None  Mental Status: Normal mood and affect. Normal behavior. Normal judgment and thought content.   Assessment and Plan:  Pregnancy: [id number] at [redacted]w[redacted]d 1. Supervision of other normal pregnancy, antepartum (Primary) - on PNV - will start baby ASA - AFP, Serum, Open Spina Bifida  2. [redacted] weeks gestation of pregnancy - recheck 4 weeks  3. Hypokalemia - bmp ordered for today  4.  History of miscarriage - using vaginal progesterone    Preterm labor symptoms and general obstetric precautions including but not limited to vaginal bleeding, contractions, leaking of fluid and fetal movement were reviewed in detail with the patient. Please refer to After Visit Summary for other counseling recommendations.   Return in about 4 weeks (around 04/03/2023).  Future Appointments  Date Time Provider Department Center  04/07/2023  1:15 PM [facility] NURSE [facility]  04/07/2023  1:30 PM [facility]  04/08/2023  1:35 PM Cleotilde Ronal RAMAN, MD [facility]  05/01/2023  2:15 PM Cleotilde Ronal RAMAN, MD [facility]  05/29/2023  8:35 AM Lo, Arland POUR, CNM [facility]  06/23/2023  1:35 PM Cleotilde Ronal RAMAN, MD [facility]  07/07/2023  1:35 PM Tad, Arland POUR, CNM [facility]  07/17/2023  1:35 PM Cleotilde Ronal RAMAN, MD [facility]  07/31/2023  1:35 PM Cleotilde Ronal RAMAN, MD [facility]  08/07/2023  2:15 PM Cleotilde Ronal RAMAN, MD [facility]  08/14/2023  1:35 PM Cleotilde Ronal RAMAN, MD [facility]  08/21/2023  1:55 PM Cleotilde Ronal RAMAN, MD [facility]  08/28/2023  1:15 PM Cleotilde Ronal RAMAN, MD [facility]    Ronal RAMAN Cleotilde, MD

## 2023-03-07 LAB — BASIC METABOLIC PANEL
BUN/Creatinine Ratio: 18 (ref 9–23)
BUN: 10 mg/dL (ref 6–20)
CO2: 21 mmol/L (ref 20–29)
Calcium: 9 mg/dL (ref 8.7–10.2)
Chloride: 99 mmol/L (ref 96–106)
Creatinine, Ser: 0.57 mg/dL (ref 0.57–1.00)
Glucose: 76 mg/dL (ref 70–99)
Potassium: 4.3 mmol/L (ref 3.5–5.2)
Sodium: 136 mmol/L (ref 134–144)
eGFR: 124 mL/min/{1.73_m2} (ref >59)

## 2023-03-08 ENCOUNTER — Encounter (HOSPITAL_BASED_OUTPATIENT_CLINIC_OR_DEPARTMENT_OTHER): Payer: Self-pay | Admitting: Obstetrics & Gynecology

## 2023-03-08 LAB — AFP, SERUM, OPEN SPINA BIFIDA
AFP MoM: 0.69
AFP Value: 20.9 ng/mL
Gest. Age on Collection Date: 15 wk
Maternal Age At EDD: 33.4 a
OSBR Risk 1 IN: 10000
Test Results:: NEGATIVE
Weight: 140 [lb_av]

## 2023-03-24 ENCOUNTER — Other Ambulatory Visit (HOSPITAL_BASED_OUTPATIENT_CLINIC_OR_DEPARTMENT_OTHER): Payer: Self-pay | Admitting: *Deleted

## 2023-03-24 ENCOUNTER — Encounter (HOSPITAL_BASED_OUTPATIENT_CLINIC_OR_DEPARTMENT_OTHER): Payer: Self-pay | Admitting: Obstetrics & Gynecology

## 2023-03-25 ENCOUNTER — Other Ambulatory Visit (HOSPITAL_BASED_OUTPATIENT_CLINIC_OR_DEPARTMENT_OTHER): Payer: Self-pay | Admitting: Certified Nurse Midwife

## 2023-03-25 ENCOUNTER — Other Ambulatory Visit (HOSPITAL_BASED_OUTPATIENT_CLINIC_OR_DEPARTMENT_OTHER): Payer: Self-pay

## 2023-03-25 MED ORDER — ONDANSETRON 8 MG PO TBDP: 8.0000 mg | ORAL_TABLET | Freq: Three times a day (TID) | ORAL | 5 refills | Status: DC | PRN

## 2023-04-07 ENCOUNTER — Other Ambulatory Visit: Payer: Self-pay

## 2023-04-07 ENCOUNTER — Encounter: Payer: Self-pay | Admitting: *Deleted

## 2023-04-07 ENCOUNTER — Ambulatory Visit: Payer: Medicaid Other | Attending: Certified Nurse Midwife

## 2023-04-07 ENCOUNTER — Ambulatory Visit (HOSPITAL_BASED_OUTPATIENT_CLINIC_OR_DEPARTMENT_OTHER): Payer: Medicaid Other | Admitting: Maternal & Fetal Medicine

## 2023-04-07 ENCOUNTER — Ambulatory Visit: Payer: Medicaid Other

## 2023-04-07 ENCOUNTER — Other Ambulatory Visit: Payer: Self-pay | Admitting: *Deleted

## 2023-04-07 ENCOUNTER — Ambulatory Visit: Payer: Medicaid Other | Admitting: *Deleted

## 2023-04-07 VITALS — BP 115/64 | HR 82

## 2023-04-07 DIAGNOSIS — Z3480 Encounter for supervision of other normal pregnancy, unspecified trimester: Secondary | ICD-10-CM

## 2023-04-07 DIAGNOSIS — Z3A19 19 weeks gestation of pregnancy: Secondary | ICD-10-CM | POA: Diagnosis not present

## 2023-04-07 DIAGNOSIS — M351 Other overlap syndromes: Secondary | ICD-10-CM | POA: Diagnosis not present

## 2023-04-07 DIAGNOSIS — Z363 Encounter for antenatal screening for malformations: Secondary | ICD-10-CM | POA: Diagnosis not present

## 2023-04-07 DIAGNOSIS — Z362 Encounter for other antenatal screening follow-up: Secondary | ICD-10-CM

## 2023-04-07 DIAGNOSIS — O358XX0 Maternal care for other (suspected) fetal abnormality and damage, not applicable or unspecified: Secondary | ICD-10-CM | POA: Diagnosis not present

## 2023-04-07 DIAGNOSIS — O321XX0 Maternal care for breech presentation, not applicable or unspecified: Secondary | ICD-10-CM | POA: Diagnosis not present

## 2023-04-07 DIAGNOSIS — M329 Systemic lupus erythematosus, unspecified: Secondary | ICD-10-CM | POA: Diagnosis not present

## 2023-04-07 DIAGNOSIS — Z3481 Encounter for supervision of other normal pregnancy, first trimester: Secondary | ICD-10-CM | POA: Insufficient documentation

## 2023-04-07 NOTE — Progress Notes (Signed)
Patient information  Patient Name: Meagan Hunter  Patient MRN:   [id number]  Referring practice: [facility] Referring Provider: [facility]  MFM CONSULT  Meagan Hunter is a 33 y.o. [id number] at [redacted]w[redacted]d here for ultrasound and consultation. Patient Active Problem List   Diagnosis Date Noted   Supervision of other normal pregnancy, antepartum 01/09/2023   Generalized anxiety disorder 02/28/2022   ADHD (attention deficit hyperactivity disorder), combined type 01/14/2022   Lupus (systemic lupus erythematosus) (HCC) 06/04/2020   Bipolar disorder, current episode mixed, moderate (HCC) 06/20/2015   Meagan Hunter is doing well today with no acute concerns. She denies contractions, bleeding, or loss of fluid and reports good fetal movement.   RE questionable history of systemic lupus erythematosus or mixed connective tissue disorder: The patient reports that she has had a number of vague symptoms including malar rash and joint pain, worsening gastroparesis/emesis, UTI, daily migraines, feeling 'generally really unwell', and alopecia as early as 2015 or 2016.  She went to her family doctor and had a series of tests that were concerning for lupus in 2017.  She was referred to a rheumatologist but was not able to go to that visit due to insurance issues.  She has never seen a rheumatologist or taken medication for lupus.  She has no evidence of kidney disease.  Most of her symptoms are pretty well-controlled currently except for occasional fatigue and joint pain.  I discussed my concern that she may have a spurious diagnosis and would prefer to confirm this before altering her pregnancy plan of care.  In the meantime she should have at least baseline labs including SSA, SSB, C3, C4, CMP, ds-DNA, anti-Smith antibodies ANA, CBC, any 4-hour urine protein or protein creatinine ratio, antiphospholipid antibodies.  Per review of the EMR she has had negative SSA and SSB testing in the past and therefore the fetus  should not be at a significantly increased risk of congenital heart block.  I discussed the potential complications regarding lupus in pregnancy. We discussed that pregnancy often results in a reduction in symptoms in during the pregnancy with a risk of flare in the postpartum period. SLE is associated with an increased risk of preeclampsia, SGA, and thus preterm birth, with most cases of adverse perinatal outcomes in patients with SLE complicated by renal disease. There is an association in SLE and anti-SSA/SSB and its subsequent risk for congenital/neonatal lupus and heart block.  SLE flares are typically prevented or treated in pregnancy with plaquenil as first line medication. Acute flares are treated with oral prednisone as a second line medication. Prednisone use in the first trimester of pregnancy has been associated with a risk of fetal cleft lip/palate and use of high doses later in pregnancy (while does not cross the placenta) has been associated with a small risk of GDM and PPROM. Use of such medications may be indicated if the flare warrants treatment. Other anti-inflammatory medications may be used in more serious cases (azathioprine, cyclosporine, or inflixamib (Remicaide). Cell Cept has been associated with an increase in the risk of fetal malformations and is thus not recommended in pregnancy if other therapy is effective. Acute flare in pregnancy may be difficult to distinguish from preeclampsia. In some cases a decline in C3, C4 is associated with a flare vs. stable levels in gHTN.   I expressed my concern of needing a formal diagnosis before altering her pregnancy plan of care.  In the meantime we can perform growth ultrasounds and lab work until  she sees a rheumatologist.  Sonographic findings Single intrauterine pregnancy at 19w 4d  Fetal cardiac activity:  Observed and appears normal. Presentation: Breech. The anatomic structures that were well seen appear normal without evidence of  soft markers. Due to poor acoustic windows some structures remain suboptimally visualized. Fetal biometry shows the estimated fetal weight at the 10 percentile.  Amniotic fluid: Within normal limits.  MVP:  cm. Placenta: Posterior. Adnexa: No abnormality visualized. Cervical length: 3.3 cm.  There are limitations of prenatal ultrasound such as the inability to detect certain abnormalities due to poor visualization. Various factors such as fetal position, gestational age and maternal body habitus may increase the difficulty in visualizing the fetal anatomy.    Recommendations - EDD should be 08/28/2023 based on  LMP  (11/21/22). - Follow up ultrasound in 4-6 weeks to attempt visualization of the anatomy not seen and reassess the fetal growth -I discussed my concern that she may have a spurious diagnosis of SLE and would prefer to confirm this before altering her pregnancy plan of care.  In the meantime she should have at least baseline labs including SSA, SSB, C3, C4, CMP, ds-DNA, anti-Smith antibodies, ANA, CBC, any 4-hour urine protein or protein creatinine ratio.  This will help the rheumatologist make the diagnosis at the time of her consultation. -OB provider to make an expedited referral to rheumatology to establish a diagnosis. -81 mg of aspirin for preeclampsia risk reduction -Continue routine prenatal care with referring OB provider  Review of Systems: A review of systems was performed and was negative except per HPI   Vitals and Physical Exam    04/07/2023    1:12 PM 03/06/2023    2:51 PM 02/27/2023   12:07 AM  Vitals with BMI  Weight  140 lbs 13 oz   Systolic 115 126 409  Diastolic 64 79 66  Pulse 82 98 84  Sitting comfortably on the sonogram table Nonlabored breathing Normal rate and rhythm Abdomen is nontender  Past pregnancies OB History  Gravida Para Term Preterm AB Living  5 3 3  0 1 3  SAB IAB Ectopic Multiple Live Births  1 0 0 0 3    # Outcome Date GA Lbr Len/2nd  Weight Sex Type Anes PTL Lv  5 Current           4 SAB 04/2022          3 Term 12/13/20   7 lb 6.2 oz (3.35 kg) M Vag-Spont   LIV  2 Term 08/08/14   7 lb 9.7 oz (3.45 kg) M Vag-Spont   LIV  1 Term 01/14/11   7 lb (3.175 kg) F Vag-Forceps   LIV    I spent 45 minutes reviewing the patients chart, including labs and images as well as counseling the patient about her medical conditions. Greater than 50% of the time was spent in direct face-to-face patient counseling.  Braxton Feathers  MFM, [place]   04/07/2023  2:28 PM

## 2023-04-08 ENCOUNTER — Encounter (HOSPITAL_BASED_OUTPATIENT_CLINIC_OR_DEPARTMENT_OTHER): Payer: Self-pay

## 2023-04-08 ENCOUNTER — Encounter (HOSPITAL_BASED_OUTPATIENT_CLINIC_OR_DEPARTMENT_OTHER): Payer: Medicaid Other | Admitting: Certified Nurse Midwife

## 2023-04-14 ENCOUNTER — Ambulatory Visit (HOSPITAL_BASED_OUTPATIENT_CLINIC_OR_DEPARTMENT_OTHER): Payer: Medicaid Other | Admitting: Certified Nurse Midwife

## 2023-04-14 VITALS — BP 129/81 | HR 114 | Wt 146.8 lb

## 2023-04-14 DIAGNOSIS — Z3480 Encounter for supervision of other normal pregnancy, unspecified trimester: Secondary | ICD-10-CM

## 2023-04-14 DIAGNOSIS — R0602 Shortness of breath: Secondary | ICD-10-CM | POA: Diagnosis not present

## 2023-04-14 DIAGNOSIS — J0120 Acute ethmoidal sinusitis, unspecified: Secondary | ICD-10-CM | POA: Diagnosis not present

## 2023-04-14 DIAGNOSIS — M351 Other overlap syndromes: Secondary | ICD-10-CM | POA: Diagnosis not present

## 2023-04-14 MED ORDER — AZITHROMYCIN 250 MG PO TABS: ORAL_TABLET | ORAL | 0 refills | Status: DC

## 2023-04-14 MED ORDER — VENTOLIN HFA 108 (90 BASE) MCG/ACT IN AERS: 2.0000 | INHALATION_SPRAY | Freq: Four times a day (QID) | RESPIRATORY_TRACT | 3 refills | Status: AC | PRN

## 2023-04-14 NOTE — Progress Notes (Signed)
  Having pain and pressure over ethmoid sinuses. She has tried OTC medications and Sudafed and reports "nothing is helping". Will send Z-pak for likely ethmoidal sinus infection. Pt will monitor for fever, chills.

## 2023-04-14 NOTE — Progress Notes (Signed)
    PRENATAL VISIT NOTE  Subjective:  Meagan Hunter is a 33 y.o. [phone] at [redacted]w[redacted]d being seen today for ongoing prenatal care.  She is currently monitored for the following issues for this  pregnancy and has Mixed connective tissue disease (HCC)?; ADHD (attention deficit hyperactivity disorder), combined type; Generalized anxiety disorder; Bipolar disorder, current episode mixed, moderate (HCC); and Supervision of other normal pregnancy, antepartum on their problem list.  Patient reports no bleeding, no contractions, no cramping, and no leaking.  Contractions: Not present. Vag. Bleeding: None.  Movement: Present. Denies leaking of fluid.   The following portions of the patient's history were reviewed and updated as appropriate: allergies, current medications, past family history, past medical history, past social history, past surgical history and problem list.   Objective:   Vitals:   04/14/23 1101  BP: 129/81  Pulse: (!) 114  Weight: 146 lb 12.8 oz (66.6 kg)    Fetal Status: Fetal Heart Rate (bpm): 150   Movement: Present     General:  Alert, oriented and cooperative. Patient is in no acute distress.  Skin: Skin is warm and dry. No rash noted.   Cardiovascular: Normal heart rate noted  Respiratory: Normal respiratory effort, no problems with respiration noted  Abdomen: Soft, gravid, appropriate for gestational age.  Pain/Pressure: Absent     Pelvic: Cervical exam deferred        Extremities: Normal range of motion.  Edema: None  Mental Status: Normal mood and affect. Normal behavior. Normal judgment and thought content.   Assessment and Plan:  Pregnancy: [id number] at [redacted]w[redacted]d  Supervision Pregnancy [redacted]w[redacted]d  2. Mixed connective tissue disease (HCC) (Primary) - MFM requested following labs- - Antiphospholipid Syndrome Comp - Sjogren's syndrome antibods(ssa + ssb) - C3 and C4 - CBC - Comp Met (CMET) - ANA+ENA+DNA/DS+Scl 70+SjoSSA/B - Anti-Sm Ab (RDL) - Protein / creatinine ratio,  urine  3. Acute non-recurrent ethmoidal sinusitis - Pt with sinus pain/pressure - azithromycin (ZITHROMAX Z-PAK) 250 MG tablet; Take 2 tablets on day one.  Then take one tablet daily to complete 5 days.  Dispense: 6 tablet; Refill: 0  Preterm labor symptoms and general obstetric precautions including but not limited to vaginal bleeding, contractions, leaking of fluid and fetal movement were reviewed in detail with the patient. Please refer to After Visit Summary for other counseling recommendations.   No follow-ups on file.  Future Appointments  Date Time Provider Department Center  05/01/2023  2:15 PM Jerene Bears, MD [facility]  05/12/2023  1:30 PM [facility] US[facility]  05/29/2023  8:35 AM Deyonte Cadden, Toma Aran, CNM [facility]  06/23/2023  1:35 PM Jerene Bears, MD [facility]  07/07/2023  1:35 PM Marton Redwood, Toma Aran, CNM [facility]  07/17/2023  1:35 PM Jerene Bears, MD [facility]  07/31/2023  1:35 PM Jerene Bears, MD [facility]  08/07/2023  2:15 PM Jerene Bears, MD [facility]  08/14/2023  1:35 PM Jerene Bears, MD [facility]  08/21/2023  1:55 PM Jerene Bears, MD [facility]  08/28/2023  1:15 PM Jerene Bears, MD [facility]    Letta Kocher, CNM

## 2023-04-15 LAB — PROTEIN / CREATININE RATIO, URINE
Creatinine, Urine: 147.5 mg/dL
Protein, Ur: 12.8 mg/dL
Protein/Creat Ratio: 87 mg/g{creat} (ref 0–200)

## 2023-04-28 LAB — CBC
Hematocrit: 37.2 % (ref 34.0–46.6)
Hemoglobin: 12.4 g/dL (ref 11.1–15.9)
MCH: 31.8 pg (ref 26.6–33.0)
MCHC: 33.3 g/dL (ref 31.5–35.7)
MCV: 95 fL (ref 79–97)
Platelets: 198 10*3/uL (ref 150–450)
RBC: 3.9 x10E6/uL (ref 3.77–5.28)
RDW: 12.1 % (ref 11.7–15.4)
WBC: 9.4 10*3/uL (ref 3.4–10.8)

## 2023-04-28 LAB — ANTIPHOSPHOLIPID SYNDROME COMP
APTT: 23.3 s
Anticardiolipin Ab, IgA: <10 [APL'U]
Anticardiolipin Ab, IgG: <10 [GPL'U]
Anticardiolipin Ab, IgM: <10 [MPL'U]
Antiphosphatidylserine IgG: 0 {GPS'U}
Antiphosphatidylserine IgM: 0 {MPS'U}
Antiprothrombin Antibody, IgG: 0 G units
Beta-2 Glycoprotein I, IgA: <10 SAU
Beta-2 Glycoprotein I, IgG: <10 SGU
Beta-2 Glycoprotein I, IgM: <10 SMU
DRVVT Screen Seconds: 31.1 s
Hexagonal Phospholipid Neutral: 4 s
Platelet Neutralization: 0.7 s

## 2023-04-28 LAB — COMPREHENSIVE METABOLIC PANEL
ALT: 9 IU/L (ref 0–32)
AST: 15 IU/L (ref 0–40)
Albumin: 3.9 g/dL (ref 3.9–4.9)
Alkaline Phosphatase: 55 IU/L (ref 44–121)
BUN/Creatinine Ratio: 13 (ref 9–23)
BUN: 9 mg/dL (ref 6–20)
Bilirubin Total: 0.2 mg/dL (ref 0.0–1.2)
CO2: 18 mmol/L — ABNORMAL LOW (ref 20–29)
Calcium: 8.9 mg/dL (ref 8.7–10.2)
Chloride: 104 mmol/L (ref 96–106)
Creatinine, Ser: 0.71 mg/dL (ref 0.57–1.00)
Globulin, Total: 2.6 g/dL (ref 1.5–4.5)
Glucose: 73 mg/dL (ref 70–99)
Potassium: 3.7 mmol/L (ref 3.5–5.2)
Sodium: 137 mmol/L (ref 134–144)
Total Protein: 6.5 g/dL (ref 6.0–8.5)
eGFR: 115 mL/min/{1.73_m2} (ref >59)

## 2023-04-28 LAB — ANTI-SM AB (RDL): Anti-Sm Ab (RDL): <20 U (ref <20)

## 2023-04-28 LAB — ANA+ENA+DNA/DS+SCL 70+SJOSSA/B
ANA Titer 1: NEGATIVE
ENA RNP Ab: <0.2 AI (ref 0.0–0.9)
ENA SM Ab Ser-aCnc: <0.2 AI (ref 0.0–0.9)
ENA SSA (RO) Ab: <0.2 AI (ref 0.0–0.9)
ENA SSB (LA) Ab: <0.2 AI (ref 0.0–0.9)
Scleroderma (Scl-70) (ENA) Antibody, IgG: <0.2 AI (ref 0.0–0.9)
dsDNA Ab: <1 [IU]/mL (ref 0–9)

## 2023-04-28 LAB — C3 AND C4
Complement C3, Serum: 140 mg/dL (ref 82–167)
Complement C4, Serum: 24 mg/dL (ref 12–38)

## 2023-05-01 ENCOUNTER — Ambulatory Visit (HOSPITAL_BASED_OUTPATIENT_CLINIC_OR_DEPARTMENT_OTHER): Payer: Medicaid Other | Admitting: Obstetrics & Gynecology

## 2023-05-01 VITALS — BP 115/75 | HR 74 | Wt 150.8 lb

## 2023-05-01 DIAGNOSIS — M329 Systemic lupus erythematosus, unspecified: Secondary | ICD-10-CM | POA: Insufficient documentation

## 2023-05-01 DIAGNOSIS — M351 Other overlap syndromes: Secondary | ICD-10-CM

## 2023-05-01 DIAGNOSIS — Z3480 Encounter for supervision of other normal pregnancy, unspecified trimester: Secondary | ICD-10-CM

## 2023-05-01 DIAGNOSIS — Z3A23 23 weeks gestation of pregnancy: Secondary | ICD-10-CM | POA: Diagnosis not present

## 2023-05-01 DIAGNOSIS — O0992 Supervision of high risk pregnancy, unspecified, second trimester: Secondary | ICD-10-CM | POA: Diagnosis not present

## 2023-05-01 DIAGNOSIS — M3219 Other organ or system involvement in systemic lupus erythematosus: Secondary | ICD-10-CM

## 2023-05-04 NOTE — Progress Notes (Signed)
   PRENATAL VISIT NOTE  Subjective:  Meagan Hunter is a 33 y.o. [phone] at [redacted]w[redacted]d being seen today for ongoing prenatal care.  She is currently monitored for the following issues for this high-risk pregnancy and has Mixed connective tissue disease (HCC)?; ADHD (attention deficit hyperactivity disorder), combined type; Generalized anxiety disorder; Bipolar disorder, current episode mixed, moderate (HCC); Supervision of other normal pregnancy, antepartum; and Systemic lupus erythematosus (HCC) on their problem list.  Patient reports  frustrating visit with MFM.  Felt like she was told there was nothing wrong with her and that she doesn't need any extra monitoring.  Follow up growth has been scheduled.  Additional lab work recommended that has been completed and is all negative.  Reviewed with pt today.  Rheumatology referral placed as well per MFM recommendations .  Contractions: Not present. Vag. Bleeding: None.  Movement: Present. Denies leaking of fluid.   The following portions of the patient's history were reviewed and updated as appropriate: allergies, current medications, past family history, past medical history, past social history, past surgical history and problem list.   Objective:   Vitals:   05/01/23 1429  BP: 115/75  Pulse: 74  Weight: 150 lb 12.8 oz (68.4 kg)    Fetal Status: Fetal Heart Rate (bpm): 134 Fundal Height: 24 cm Movement: Present     General:  Alert, oriented and cooperative. Patient is in no acute distress.  Skin: Skin is warm and dry. No rash noted.   Cardiovascular: Normal heart rate noted  Respiratory: Normal respiratory effort, no problems with respiration noted  Abdomen: Soft, gravid, appropriate for gestational age.  Pain/Pressure: Absent     Pelvic: Cervical exam deferred        Extremities: Normal range of motion.  Edema: None  Mental Status: Normal mood and affect. Normal behavior. Normal judgment and thought content.   Assessment and Plan:   Pregnancy: [id number] at [redacted]w[redacted]d 1. Supervision of high risk pregnancy in second trimester (Primary) - on PNV and baby ASA - recheck 4 weeks for GTT/Tdap/ third trimester labs  2. [redacted] weeks gestation of pregnancy  3. Other systemic lupus erythematosus with other organ involvement (HCC) - growth scan in 4 weeks has been scheduled - Ambulatory referral to Rheumatology  4. Mixed connective tissue disease (HCC)?  Preterm labor symptoms and general obstetric precautions including but not limited to vaginal bleeding, contractions, leaking of fluid and fetal movement were reviewed in detail with the patient. Please refer to After Visit Summary for other counseling recommendations.   Return in about 4 weeks (around 05/29/2023).  Future Appointments  Date Time Provider Department Center  05/12/2023  1:30 PM [facility] US4 [facility]  05/29/2023  8:35 AM Lo, Toma Aran, CNM [facility]  06/23/2023  1:35 PM Jerene Bears, MD [facility]  07/07/2023  1:35 PM Lo, Toma Aran, CNM [facility]  07/17/2023  1:35 PM Jerene Bears, MD [facility]  07/31/2023  1:35 PM Jerene Bears, MD [facility]  08/07/2023  2:15 PM Jerene Bears, MD [facility]  08/14/2023  1:35 PM Jerene Bears, MD [facility]  08/21/2023  1:55 PM Jerene Bears, MD [facility]  08/28/2023  1:15 PM Jerene Bears, MD [facility]    Jerene Bears, MD

## 2023-05-12 ENCOUNTER — Ambulatory Visit: Attending: Obstetrics | Admitting: Obstetrics

## 2023-05-12 ENCOUNTER — Ambulatory Visit: Payer: Medicaid Other | Attending: Obstetrics and Gynecology

## 2023-05-12 ENCOUNTER — Other Ambulatory Visit: Payer: Self-pay | Admitting: *Deleted

## 2023-05-12 DIAGNOSIS — Z362 Encounter for other antenatal screening follow-up: Secondary | ICD-10-CM | POA: Diagnosis present

## 2023-05-12 DIAGNOSIS — O99891 Other specified diseases and conditions complicating pregnancy: Secondary | ICD-10-CM | POA: Diagnosis not present

## 2023-05-12 DIAGNOSIS — M329 Systemic lupus erythematosus, unspecified: Secondary | ICD-10-CM | POA: Diagnosis not present

## 2023-05-12 DIAGNOSIS — O99892 Other specified diseases and conditions complicating childbirth: Secondary | ICD-10-CM

## 2023-05-12 DIAGNOSIS — Z3A24 24 weeks gestation of pregnancy: Secondary | ICD-10-CM

## 2023-05-12 DIAGNOSIS — Z3689 Encounter for other specified antenatal screening: Secondary | ICD-10-CM

## 2023-05-12 DIAGNOSIS — O99112 Other diseases of the blood and blood-forming organs and certain disorders involving the immune mechanism complicating pregnancy, second trimester: Secondary | ICD-10-CM

## 2023-05-12 NOTE — Progress Notes (Signed)
 MFM Consult Note  Meagan Hunter is currently at 24 weeks and 4 days.  She has been followed due to maternal lupus.  She is not taking any medications for treatment of lupus and is awaiting a follow-up appointment with her rheumatologist.    Her recent blood work for lupus were all within normal limits.  She has screened negative for SSA and SSB antibodies in the past.  She denies any problems since her last exam.  She was informed that the fetal growth and amniotic fluid level appears appropriate for her gestational age.  A small anterior succenturiate lobe of the placenta was noted on today's exam.  The main posterior lobe of the placenta appeared within normal limits.  There were no signs of vasa previa noted today.    Care should be taken to ensure that the anterior succenturiate lobe of the placenta is removed at the time of delivery.  Due to lupus, we will continue to follow her with monthly growth ultrasounds.    Weekly fetal testing should be started at around 32 weeks.    She should continue taking a daily baby aspirin for preeclampsia prophylaxis.    A follow-up exam was scheduled in 4 weeks.    The patient stated that all of her questions were answered today.  A total of 20 minutes was spent counseling and coordinating the care for this patient.  Greater than 50% of the time was spent in direct face-to-face contact.

## 2023-05-29 ENCOUNTER — Encounter (HOSPITAL_BASED_OUTPATIENT_CLINIC_OR_DEPARTMENT_OTHER): Payer: Self-pay | Admitting: Certified Nurse Midwife

## 2023-05-29 ENCOUNTER — Ambulatory Visit (HOSPITAL_BASED_OUTPATIENT_CLINIC_OR_DEPARTMENT_OTHER): Payer: Medicaid Other | Admitting: Certified Nurse Midwife

## 2023-05-29 VITALS — BP 120/66 | HR 93 | Wt 161.2 lb

## 2023-05-29 DIAGNOSIS — M329 Systemic lupus erythematosus, unspecified: Secondary | ICD-10-CM

## 2023-05-29 DIAGNOSIS — O0992 Supervision of high risk pregnancy, unspecified, second trimester: Secondary | ICD-10-CM

## 2023-05-29 DIAGNOSIS — Z3A27 27 weeks gestation of pregnancy: Secondary | ICD-10-CM | POA: Diagnosis not present

## 2023-05-29 DIAGNOSIS — O0990 Supervision of high risk pregnancy, unspecified, unspecified trimester: Secondary | ICD-10-CM | POA: Diagnosis not present

## 2023-05-29 NOTE — Progress Notes (Signed)
      PRENATAL VISIT NOTE  Subjective:  Meagan Hunter is a 33 y.o. G5P3013 at [redacted]w[redacted]d being seen today for ongoing prenatal care.  She is currently monitored for the following issues for this  pregnancy and has Mixed connective tissue disease (HCC)?; ADHD (attention deficit hyperactivity disorder), combined type; Generalized anxiety disorder; Bipolar disorder, current episode mixed, moderate (HCC); Supervision of high risk pregnancy, antepartum; and Systemic lupus erythematosus (HCC) on their problem list.  Patient reports no bleeding, no contractions, no cramping, and no leaking. Denies leaking of fluid.   The following portions of the patient's history were reviewed and updated as appropriate: allergies, current medications, past family history, past medical history, past social history, past surgical history and problem list.   Objective:  There were no vitals filed for this visit.  Fetal Status:           General:  Alert, oriented and cooperative. Patient is in no acute distress.  Skin: Skin is warm and dry. No rash noted.   Cardiovascular: Normal heart rate noted  Respiratory: Normal respiratory effort, no problems with respiration noted  Abdomen: Soft, gravid, appropriate for gestational age.        Pelvic: Cervical exam deferred        Extremities: Normal range of motion.     Mental Status: Normal mood and affect. Normal behavior. Normal judgment and thought content.   Assessment and Plan:  Pregnancy: G5P3013 at [redacted]w[redacted]d 1. Supervision of high risk pregnancy in second trimester (Primary) - Taking PNV and ASA - [place] (05/12/23): Breech, posterior placenta w/ succenturiate lobe, AFI wnl, EFW 1lb 7oz. 648g (18%), EDD 08/28/23. Monthly Growth ultrasounds planned. Begin weekly fetal testing at 32 weeks.  Next [place] 06/09/23. - Pt is unsure but considering Bilateral salpingectomy. She would like to sign Medicaid 30day consent today. - Declines Tdap today but desires Tdap at next RPV - Glucose  Tolerance, 2 Hours w/1 Hour - CBC - HIV Antibody (routine testing w rflx) - RPR  2. [redacted] weeks gestation of pregnancy  3. Hx Lupus - MFM Consult completed - Has Rheumatology appt August 2025 (New Patient) - Ambulatory referral to Rheumatology   4. Mixed connective tissue disease (HCC)?    Preterm labor symptoms and general obstetric precautions including but not limited to vaginal bleeding, contractions, leaking of fluid and fetal movement were reviewed in detail with the patient. Please refer to After Visit Summary for other counseling recommendations.   No follow-ups on file.  Future Appointments  Date Time Provider Department Center  06/09/2023  1:30 PM [facility] US5 [facility]  06/23/2023  1:35 PM Sue Lush, FNP [facility]  07/07/2023  1:35 PM Gricel Copen, Toma Aran, CNM [facility]  07/08/2023  1:30 PM [facility] US2 [facility]  07/15/2023  1:30 PM [facility] US2 [facility]  07/17/2023  1:35 PM Jerene Bears, MD [facility]  07/31/2023  1:35 PM Jerene Bears, MD [facility]  08/07/2023  2:15 PM Jerene Bears, MD [facility]  08/14/2023  1:35 PM Jerene Bears, MD [facility]  08/21/2023  1:55 PM Jerene Bears, MD [facility]  08/28/2023  1:15 PM Jerene Bears, MD [facility]  10/15/2023  8:00 AM Fuller Plan, MD [facility] None    Letta Kocher, CNM

## 2023-05-30 ENCOUNTER — Encounter (HOSPITAL_BASED_OUTPATIENT_CLINIC_OR_DEPARTMENT_OTHER): Payer: Self-pay | Admitting: Certified Nurse Midwife

## 2023-05-30 ENCOUNTER — Other Ambulatory Visit (HOSPITAL_BASED_OUTPATIENT_CLINIC_OR_DEPARTMENT_OTHER): Payer: Self-pay | Admitting: Certified Nurse Midwife

## 2023-05-30 DIAGNOSIS — O0990 Supervision of high risk pregnancy, unspecified, unspecified trimester: Secondary | ICD-10-CM

## 2023-05-30 LAB — GLUCOSE TOLERANCE, 2 HOURS W/ 1HR
Glucose, 1 hour: 168 mg/dL (ref 70–179)
Glucose, 2 hour: 116 mg/dL (ref 70–152)
Glucose, Fasting: 71 mg/dL (ref 70–91)

## 2023-05-30 LAB — CBC
Hematocrit: 37.6 % (ref 34.0–46.6)
Hemoglobin: 12.1 g/dL (ref 11.1–15.9)
MCH: 31.3 pg (ref 26.6–33.0)
MCHC: 32.2 g/dL (ref 31.5–35.7)
MCV: 97 fL (ref 79–97)
Platelets: 151 10*3/uL (ref 150–450)
RBC: 3.86 x10E6/uL (ref 3.77–5.28)
RDW: 12.6 % (ref 11.7–15.4)
WBC: 9 10*3/uL (ref 3.4–10.8)

## 2023-05-30 LAB — RPR: RPR Ser Ql: NONREACTIVE

## 2023-05-30 LAB — HIV ANTIBODY (ROUTINE TESTING W REFLEX): HIV Screen 4th Generation wRfx: NONREACTIVE

## 2023-06-04 ENCOUNTER — Other Ambulatory Visit (HOSPITAL_BASED_OUTPATIENT_CLINIC_OR_DEPARTMENT_OTHER): Payer: Self-pay | Admitting: Obstetrics & Gynecology

## 2023-06-04 DIAGNOSIS — O219 Vomiting of pregnancy, unspecified: Secondary | ICD-10-CM

## 2023-06-04 MED ORDER — ONDANSETRON 8 MG PO TBDP: 8.0000 mg | ORAL_TABLET | Freq: Three times a day (TID) | ORAL | 2 refills | Status: DC | PRN

## 2023-06-09 ENCOUNTER — Other Ambulatory Visit: Payer: Self-pay | Admitting: *Deleted

## 2023-06-09 ENCOUNTER — Ambulatory Visit: Attending: Obstetrics

## 2023-06-09 DIAGNOSIS — Z3A28 28 weeks gestation of pregnancy: Secondary | ICD-10-CM

## 2023-06-09 DIAGNOSIS — O358XX0 Maternal care for other (suspected) fetal abnormality and damage, not applicable or unspecified: Secondary | ICD-10-CM

## 2023-06-09 DIAGNOSIS — D6862 Lupus anticoagulant syndrome: Secondary | ICD-10-CM | POA: Diagnosis present

## 2023-06-09 DIAGNOSIS — Z3689 Encounter for other specified antenatal screening: Secondary | ICD-10-CM | POA: Insufficient documentation

## 2023-06-09 DIAGNOSIS — O99112 Other diseases of the blood and blood-forming organs and certain disorders involving the immune mechanism complicating pregnancy, second trimester: Secondary | ICD-10-CM | POA: Insufficient documentation

## 2023-06-09 DIAGNOSIS — O99891 Other specified diseases and conditions complicating pregnancy: Secondary | ICD-10-CM

## 2023-06-23 ENCOUNTER — Telehealth (HOSPITAL_BASED_OUTPATIENT_CLINIC_OR_DEPARTMENT_OTHER): Payer: Self-pay

## 2023-06-23 ENCOUNTER — Telehealth (HOSPITAL_BASED_OUTPATIENT_CLINIC_OR_DEPARTMENT_OTHER): Admitting: Obstetrics and Gynecology

## 2023-06-23 ENCOUNTER — Encounter (HOSPITAL_BASED_OUTPATIENT_CLINIC_OR_DEPARTMENT_OTHER): Payer: Medicaid Other | Admitting: Obstetrics and Gynecology

## 2023-06-23 DIAGNOSIS — M329 Systemic lupus erythematosus, unspecified: Secondary | ICD-10-CM | POA: Diagnosis not present

## 2023-06-23 DIAGNOSIS — M351 Other overlap syndromes: Secondary | ICD-10-CM

## 2023-06-23 DIAGNOSIS — O99891 Other specified diseases and conditions complicating pregnancy: Secondary | ICD-10-CM

## 2023-06-23 DIAGNOSIS — Z3A30 30 weeks gestation of pregnancy: Secondary | ICD-10-CM | POA: Diagnosis not present

## 2023-06-23 DIAGNOSIS — O0992 Supervision of high risk pregnancy, unspecified, second trimester: Secondary | ICD-10-CM

## 2023-06-23 DIAGNOSIS — O0990 Supervision of high risk pregnancy, unspecified, unspecified trimester: Secondary | ICD-10-CM

## 2023-06-23 NOTE — Telephone Encounter (Signed)
 Patient called on 06/23/23 to cancel appointment. Patient stated that she was moving to [place], [place] and had forgotten about the appointment. Called patient back and left voicemail for her to contact the office to reschedule appointment on 06/23/23 @ 1:29pM.

## 2023-06-23 NOTE — Progress Notes (Signed)
 I connected with Meagan Hunter: MyChart video and verified that I am speaking with the correct person using two identifiers.  Patient is located at home and provider is located at [facility].     I discussed the limitations, risks, security and privacy concerns of performing an evaluation and management service by MyChart video and the availability of in person appointments. By engaging in this virtual visit, you consent to the provision of healthcare.  Additionally, you authorize for your insurance to be billed for the services provided during this visit.  The patient expressed understanding and agreed to proceed.          PRENATAL VISIT NOTE  Subjective:  Meagan Hunter is a 33 y.o. [phone] at [redacted]w[redacted]d being seen today for ongoing prenatal care.  She is currently monitored for the following issues for this high-risk pregnancy and has Mixed connective tissue disease (HCC)?; ADHD (attention deficit hyperactivity disorder), combined type; Generalized anxiety disorder; Bipolar disorder, current episode mixed, moderate (HCC); Supervision of high risk pregnancy, antepartum; and Systemic lupus erythematosus (HCC) on their problem list.  Patient reports acid reflux, doing well with omeprazole   Contractions: Irregular. Vag. Bleeding: None.  Movement: Present. Denies leaking of fluid.   The following portions of the patient's history were reviewed and updated as appropriate: allergies, current medications, past family history, past medical history, past social history, past surgical history and problem list.   Objective:  There were no vitals filed for this visit.  Fetal Status:     Movement: Present     General:  Alert, oriented and cooperative. Patient is in no acute distress.        Respiratory: Normal respiratory effort,  Abdomen: Pain/Pressure: Absent        Extremities:  Edema: None  Mental Status: Normal mood and affect. Normal behavior. Normal judgment and thought content.    Assessment and Plan:  Pregnancy: [id number] at [redacted]w[redacted]d 1. Supervision of high risk pregnancy in second trimester (Primary) Unable to check BP, cuff in moving boxes, doing well overall, reports regular movement  2. [redacted] weeks gestation of pregnancy 4/14 efw 22%, afi normal  Follow up growth 5/12, weekly testing to start 32 weeks  3. Mixed connective tissue disease (HCC)? 4. Systemic lupus erythematosus, unspecified SLE type, unspecified organ involvement status (HCC) History of SLE, lab work negative, rheumatology referral in. Appt August     Preterm labor symptoms and general obstetric precautions including but not limited to vaginal bleeding, contractions, leaking of fluid and fetal movement were reviewed in detail with the patient. Please refer to After Visit Summary for other counseling recommendations.   Return in two weeks for routine prenatal   Future Appointments  Date Time Provider Department Center  07/07/2023  1:35 PM Hilda Lovings Juvenal [place]oka, CNM [facility]  07/08/2023  1:00 PM [facility] PROVIDER 1 [facility]  07/08/2023  1:30 PM [facility] US2 [facility]  07/15/2023  1:00 PM [facility] PROVIDER 1 [facility]  07/15/2023  1:30 PM [facility] US2 [facility]  07/17/2023  1:35 PM Lillian Rein, MD [facility]  07/22/2023  1:00 PM [facility] PROVIDER 1 [facility]  07/22/2023  1:30 PM [facility] US6 [facility]  07/29/2023  1:00 PM [facility] PROVIDER 1 [facility]  07/29/2023  1:30 PM [facility] US2 [facility]  07/31/2023  1:35 PM Lillian Rein, MD [facility]  08/05/2023  1:00 PM [facility] PROVIDER 1 [facility]  08/05/2023  1:30 PM [facility] US2 [facility]  08/07/2023  2:15 PM Amiel Balder  S, MD [facility]  08/14/2023  1:35 PM Lillian Rein, MD [facility]  08/28/2023  1:15 PM Lillian Rein, MD [facility]  10/15/2023  8:00 AM Rodell Citrin, Haig Levan, MD [facility] None    Susi Eric, FNP

## 2023-06-30 ENCOUNTER — Encounter (HOSPITAL_BASED_OUTPATIENT_CLINIC_OR_DEPARTMENT_OTHER): Admitting: Certified Nurse Midwife

## 2023-07-07 ENCOUNTER — Encounter (HOSPITAL_BASED_OUTPATIENT_CLINIC_OR_DEPARTMENT_OTHER): Payer: Medicaid Other | Admitting: Certified Nurse Midwife

## 2023-07-08 ENCOUNTER — Ambulatory Visit (HOSPITAL_BASED_OUTPATIENT_CLINIC_OR_DEPARTMENT_OTHER): Admitting: Maternal & Fetal Medicine

## 2023-07-08 ENCOUNTER — Ambulatory Visit: Attending: Obstetrics

## 2023-07-08 DIAGNOSIS — M329 Systemic lupus erythematosus, unspecified: Secondary | ICD-10-CM | POA: Insufficient documentation

## 2023-07-08 DIAGNOSIS — O35BXX0 Maternal care for other (suspected) fetal abnormality and damage, fetal cardiac anomalies, not applicable or unspecified: Secondary | ICD-10-CM

## 2023-07-08 DIAGNOSIS — Z3A32 32 weeks gestation of pregnancy: Secondary | ICD-10-CM

## 2023-07-08 DIAGNOSIS — O99112 Other diseases of the blood and blood-forming organs and certain disorders involving the immune mechanism complicating pregnancy, second trimester: Secondary | ICD-10-CM | POA: Insufficient documentation

## 2023-07-08 DIAGNOSIS — Z3689 Encounter for other specified antenatal screening: Secondary | ICD-10-CM | POA: Insufficient documentation

## 2023-07-08 DIAGNOSIS — D6862 Lupus anticoagulant syndrome: Secondary | ICD-10-CM | POA: Insufficient documentation

## 2023-07-08 NOTE — Progress Notes (Signed)
   Patient information  Patient Name: Meagan Hunter  Patient MRN:   [id number]  Referring practice: [facility] Referring Provider: [facility]  MFM CONSULT  CALISSE PICARDI is a 33 y.o. [id number] at 107w5d here for ultrasound and consultation. Patient Active Problem List   Diagnosis Date Noted   Systemic lupus erythematosus (HCC) 05/01/2023   Supervision of high risk pregnancy, antepartum 01/09/2023   Generalized anxiety disorder 02/28/2022   ADHD (attention deficit hyperactivity disorder), 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 06/04/2020   Bipolar disorder, current episode mixed, moderate (HCC) 06/20/2015   Terriann Vanderberg is doing well today with no acute concerns.  Today's ultrasound shows normal fetal growth at 16 percentile overall.  The abdominal circumference is at the 39th percentile.  The biophysical profile is 8 out of 8.  The patient reports no new signs or symptoms of lupus.  She does not require medication and has been in remission for some time.  On a previous ultrasound there was some concern of an abnormal fetal hand this appears normal today.  Sonographic findings Single intrauterine pregnancy. Fetal cardiac activity: Observed. Presentation: Breech. Interval fetal anatomy appears normal. Fetal biometry shows the estimated fetal weight at the 16 percentile. Amniotic fluid: Within normal limits.  MVP: 6.81 cm. Placenta: Posterior. BPP: 8/8.   There are limitations of prenatal ultrasound such as the inability to detect certain abnormalities due to poor visualization. Various factors such as fetal position, gestational age and maternal body habitus may increase the difficulty in visualizing the fetal anatomy.    Recommendations -Weekly antenatal testing until delivery (NSTs can be done weekly at [facility]) -F/u growth in 4 weeks w/ MFM   Review of Systems: A review of systems was performed and was negative except per HPI   Vitals and  Physical Exam    07/08/2023    1:24 PM 06/09/2023    1:31 PM 05/29/2023    8:50 AM  Vitals with BMI  Weight   161 lbs 3 oz  Systolic 112 116 213  Diastolic 71 70 66  Pulse 79 86 93    Sitting comfortably on the sonogram table Nonlabored breathing Normal rate and rhythm Abdomen is nontender  Past pregnancies OB History  Gravida Para Term Preterm AB Living  5 3 3  0 1 3  SAB IAB Ectopic Multiple Live Births  1 0 0 0 3    # Outcome Date GA Lbr Len/2nd Weight Sex Type Anes PTL Lv  5 Current           4 SAB 04/2022          3 Term 12/13/20   7 lb 6.2 oz (3.35 kg) M Vag-Spont   LIV  2 Term 08/08/14   7 lb 9.7 oz (3.45 kg) M Vag-Spont   LIV  1 Term 01/14/11   7 lb (3.175 kg) F Vag-Forceps   LIV    I spent 20 minutes reviewing the patients chart, including labs and images as well as counseling the patient about her medical conditions. Greater than 50% of the time was spent in direct face-to-face patient counseling.  Penney Bowling  MFM, [facility]   07/08/2023  2:23 PM

## 2023-07-09 ENCOUNTER — Encounter (HOSPITAL_BASED_OUTPATIENT_CLINIC_OR_DEPARTMENT_OTHER): Payer: Self-pay | Admitting: Obstetrics & Gynecology

## 2023-07-11 ENCOUNTER — Other Ambulatory Visit (HOSPITAL_BASED_OUTPATIENT_CLINIC_OR_DEPARTMENT_OTHER): Payer: Self-pay | Admitting: Obstetrics & Gynecology

## 2023-07-11 DIAGNOSIS — O219 Vomiting of pregnancy, unspecified: Secondary | ICD-10-CM

## 2023-07-11 DIAGNOSIS — K3184 Gastroparesis: Secondary | ICD-10-CM

## 2023-07-11 MED ORDER — ONDANSETRON 8 MG PO TBDP: 8.0000 mg | ORAL_TABLET | Freq: Three times a day (TID) | ORAL | 2 refills | Status: DC | PRN

## 2023-07-15 ENCOUNTER — Ambulatory Visit (HOSPITAL_BASED_OUTPATIENT_CLINIC_OR_DEPARTMENT_OTHER)

## 2023-07-15 ENCOUNTER — Ambulatory Visit

## 2023-07-15 ENCOUNTER — Ambulatory Visit (HOSPITAL_BASED_OUTPATIENT_CLINIC_OR_DEPARTMENT_OTHER): Admitting: Certified Nurse Midwife

## 2023-07-15 ENCOUNTER — Encounter (HOSPITAL_BASED_OUTPATIENT_CLINIC_OR_DEPARTMENT_OTHER): Payer: Self-pay

## 2023-07-15 VITALS — BP 117/75 | HR 88 | Wt 172.6 lb

## 2023-07-15 DIAGNOSIS — O0993 Supervision of high risk pregnancy, unspecified, third trimester: Secondary | ICD-10-CM | POA: Diagnosis not present

## 2023-07-15 DIAGNOSIS — Z23 Encounter for immunization: Secondary | ICD-10-CM | POA: Diagnosis not present

## 2023-07-15 DIAGNOSIS — Z3A33 33 weeks gestation of pregnancy: Secondary | ICD-10-CM | POA: Diagnosis not present

## 2023-07-15 DIAGNOSIS — O0990 Supervision of high risk pregnancy, unspecified, unspecified trimester: Secondary | ICD-10-CM | POA: Diagnosis not present

## 2023-07-15 DIAGNOSIS — M329 Systemic lupus erythematosus, unspecified: Secondary | ICD-10-CM | POA: Diagnosis not present

## 2023-07-15 NOTE — Progress Notes (Signed)
      PRENATAL VISIT NOTE  Subjective:  Meagan Hunter is a 33 y.o. [phone] at [redacted]w[redacted]d being seen today for ongoing prenatal care.  She is currently monitored for the following issues for this  pregnancy and has Mixed connective tissue disease (HCC)?; ADHD (attention deficit hyperactivity disorder), combined type; Generalized anxiety disorder; Bipolar disorder, current episode mixed, moderate (HCC); Supervision of high risk pregnancy, antepartum; and Systemic lupus erythematosus (HCC) on their problem list.  Patient reports no bleeding, no contractions, no cramping, and no leaking.  Contractions: Irregular. Vag. Bleeding: None.  Movement: Present. Denies leaking of fluid.   The following portions of the patient's history were reviewed and updated as appropriate: allergies, current medications, past family history, past medical history, past social history, past surgical history and problem list.   Objective:    Vitals:   07/15/23 0840  BP: 117/75  Pulse: 88  Weight: 172 lb 9.6 oz (78.3 kg)    Fetal Status:  Fetal Heart Rate (bpm): 136   Movement: Present    General: Alert, oriented and cooperative. Patient is in no acute distress.  Skin: Skin is warm and dry. No rash noted.   Cardiovascular: Normal heart rate noted  Respiratory: Normal respiratory effort, no problems with respiration noted  Abdomen: Soft, gravid, appropriate for gestational age.  Pain/Pressure: Absent     Pelvic: Cervical exam deferred        Extremities: Normal range of motion.  Edema: None  Mental Status: Normal mood and affect. Normal behavior. Normal judgment and thought content.   Assessment and Plan:  Pregnancy: [id number] at [redacted]w[redacted]d  1. Supervision of high risk pregnancy in 3rd trimester (Primary) - Taking PNV and ASA - US  (05/12/23): Breech, posterior placenta w/ succenturiate lobe, AFI wnl, EFW 1lb 7oz. 648g (18%), EDD 08/28/23. Monthly Growth ultrasounds planned. Begin weekly fetal testing at 32 weeks.   - US   (07/08/23): [redacted]w[redacted]d, Breech, posterior placenta, AFV wnl 10cm (75%), EFW 4lb 1oz (16%), HC 61%, AC 39%, FL <1%. Fetal hand appears normal. Weekly antenatal testing until delivery ([facility]ST [facility] at [facility]). Follow-up Growth in 4 weeks with MFM.  - Pt is unsure but considering Bilateral salpingectomy. She signed Medicaid 30day consent, See "Media" - Tdap UTD (07/15/23)   2. [redacted] weeks gestation of pregnancy   3. Hx Lupus - MFM Consult completed - Has Rheumatology appt August 2025 (New Patient) - Ambulatory referral to Rheumatology - NST Reactive (07/15/23). Continue weekly NST, monthly Growth US .    4. Mixed connective tissue disease (HCC)?   Preterm labor symptoms and general obstetric precautions including but not limited to vaginal bleeding, contractions, leaking of fluid and fetal movement were reviewed in detail with the patient. Please refer to After Visit Summary for other counseling recommendations.   No follow-ups on file.  Future Appointments  Date Time Provider Department Center  07/15/2023  9:15 AM Yolanda Hence, CNM [facility]  07/24/2023  1:30 PM [facility] OBGYN NURSE [facility]  07/31/2023  1:35 PM Lillian Rein, MD [facility]  08/05/2023  1:00 PM [facility] PROVIDER 1 [facility]  08/05/2023  1:30 PM [facility] US2 [facility]  08/07/2023  2:15 PM Lillian Rein, MD [facility]  08/14/2023  1:35 PM Lillian Rein, MD [facility]  08/28/2023  1:15 PM Lillian Rein, MD [facility]  10/15/2023  8:00 AM Matt Song, MD [facility] None    Yolanda Hence, CNM

## 2023-07-17 ENCOUNTER — Encounter (HOSPITAL_BASED_OUTPATIENT_CLINIC_OR_DEPARTMENT_OTHER): Payer: Medicaid Other | Admitting: Obstetrics & Gynecology

## 2023-07-22 ENCOUNTER — Other Ambulatory Visit

## 2023-07-24 ENCOUNTER — Ambulatory Visit (HOSPITAL_BASED_OUTPATIENT_CLINIC_OR_DEPARTMENT_OTHER): Admitting: *Deleted

## 2023-07-24 VITALS — BP 121/75 | HR 89 | Ht 64.0 in | Wt 174.8 lb

## 2023-07-24 DIAGNOSIS — Z3A35 35 weeks gestation of pregnancy: Secondary | ICD-10-CM

## 2023-07-24 DIAGNOSIS — Z3A32 32 weeks gestation of pregnancy: Secondary | ICD-10-CM | POA: Diagnosis not present

## 2023-07-24 DIAGNOSIS — O0993 Supervision of high risk pregnancy, unspecified, third trimester: Secondary | ICD-10-CM

## 2023-07-24 NOTE — Progress Notes (Signed)
 NURSE VISIT- NST  SUBJECTIVE:  Meagan Hunter is a 33 y.o. [phone] female at [redacted]w[redacted]d, here for a NST for pregnancy complicated by Lupus.  She reports active fetal movement, contractions: irregular, too far to time, vaginal bleeding: none, membranes: intact.   OBJECTIVE:  BP 121/75   Pulse 89   Wt 174 lb 12.8 oz (79.3 kg)   LMP 11/21/2022   BMI 30.00 kg/m   Appears well, no apparent distress  No results found for this or any previous visit (from the past 24 hours).  NST: FHR baseline 135 bpm, Variability: moderate, Accelerations:present, Decelerations:  Absent; Reactive Toco: irregular, too spaced out to time   ASSESSMENT: [id number] at [redacted]w[redacted]d with Lupus NST reactive  PLAN: Recommendations: keep next appointment as scheduled     Maudine Sos, RN

## 2023-07-29 ENCOUNTER — Other Ambulatory Visit

## 2023-07-31 ENCOUNTER — Other Ambulatory Visit (HOSPITAL_COMMUNITY)
Admission: RE | Admit: 2023-07-31 | Discharge: 2023-07-31 | Disposition: A | Discharge status: Home / Self Care | Source: Ambulatory Visit | Attending: Obstetrics & Gynecology | Admitting: Obstetrics & Gynecology

## 2023-07-31 ENCOUNTER — Ambulatory Visit (HOSPITAL_BASED_OUTPATIENT_CLINIC_OR_DEPARTMENT_OTHER): Payer: Medicaid Other | Admitting: Obstetrics & Gynecology

## 2023-07-31 VITALS — BP 132/87 | HR 101 | Wt 175.4 lb

## 2023-07-31 DIAGNOSIS — O0993 Supervision of high risk pregnancy, unspecified, third trimester: Secondary | ICD-10-CM

## 2023-07-31 DIAGNOSIS — M351 Other overlap syndromes: Secondary | ICD-10-CM | POA: Diagnosis not present

## 2023-07-31 DIAGNOSIS — F902 Attention-deficit hyperactivity disorder, combined type: Secondary | ICD-10-CM | POA: Diagnosis not present

## 2023-07-31 DIAGNOSIS — Z3A36 36 weeks gestation of pregnancy: Secondary | ICD-10-CM | POA: Diagnosis not present

## 2023-07-31 DIAGNOSIS — F411 Generalized anxiety disorder: Secondary | ICD-10-CM

## 2023-07-31 DIAGNOSIS — O0990 Supervision of high risk pregnancy, unspecified, unspecified trimester: Secondary | ICD-10-CM

## 2023-07-31 DIAGNOSIS — M329 Systemic lupus erythematosus, unspecified: Secondary | ICD-10-CM

## 2023-07-31 NOTE — Progress Notes (Signed)
   PRENATAL VISIT NOTE  Subjective:  Meagan Hunter is a 33 y.o. G5P3013 at [redacted]w[redacted]d being seen today for ongoing prenatal care.  She is currently monitored for the following issues for this high-risk pregnancy and has Mixed connective tissue disease (HCC)?; ADHD (attention deficit hyperactivity disorder), combined type; Generalized anxiety disorder; Bipolar disorder, current episode mixed, moderate (HCC); Supervision of high risk pregnancy, antepartum; and Systemic lupus erythematosus (HCC) on their problem list.  Patient reports nausea.  Contractions: Not present. Vag. Bleeding: None.  Movement: Present. Denies leaking of fluid.   The following portions of the patient's history were reviewed and updated as appropriate: allergies, current medications, past family history, past medical history, past social history, past surgical history and problem list.   Objective:   Vitals:   07/31/23 1351  BP: 132/87  Pulse: (!) 101  Weight: 175 lb 6.4 oz (79.6 kg)    Fetal Status: Fetal Heart Rate (bpm): 140 Fundal Height: 36 cm Movement: Present  Presentation: Vertex (confrimed with ultrasound)  General:                         Alert, oriented and cooperative. Patient is in no acute distress.  Skin: Skin is warm and dry. No rash noted.   Cardiovascular: Normal heart rate noted  Respiratory: Normal respiratory effort, no problems with respiration noted  Abdomen: Soft, gravid, appropriate for gestational age.  Pain/Pressure: Present     Pelvic: Cervical exam performed in the presence of a chaperone Dilation: 3 Effacement (%): 60 Station: -3  Extremities: Normal range of motion.  Edema: None  Mental Status: Normal mood and affect. Normal behavior. Normal judgment and thought content.   Assessment and Plan:  Pregnancy: [id number] at [redacted]w[redacted]d 1. Supervision of high risk pregnancy, antepartum (Primary) - on PNV and baby ASA - recheck 1 weeks - Strep Gp B Culture+Rflx - Cervicovaginal  ancillary only( [place])  2. [redacted] weeks gestation of pregnancy - Strep Gp B Culture+Rflx - Cervicovaginal ancillary only( [place])  3. ADHD (attention deficit hyperactivity disorder), combined type  4. Mixed connective tissue disease (HCC)?  5. Generalized anxiety disorder  6. Systemic lupus erythematosus, unspecified SLE type, unspecified organ involvement status (HCC) - having weekly BPPs and has growth scan next week   Preterm labor symptoms and general obstetric precautions including but not limited to vaginal bleeding, contractions, leaking of fluid and fetal movement were reviewed in detail with the patient. Please refer to After Visit Summary for other counseling recommendations.   Return in about 1 week (around 08/07/2023).  Future Appointments  Date Time Provider Department Center  08/05/2023  1:00 PM [facility] PROVIDER 1 [facility]  08/05/2023  1:30 PM [facility] US4 [facility]  08/07/2023  2:15 PM Lillian Rein, MD [facility]  08/13/2023  8:55 AM Lillian Rein, MD [facility]  08/19/2023  3:15 PM Lo, Juvenal Opoka, CNM [facility]  08/28/2023  1:15 PM Lillian Rein, MD [facility]  10/15/2023  8:00 AM Matt Song, MD [facility] None    Lillian Rein, MD

## 2023-08-01 ENCOUNTER — Ambulatory Visit (HOSPITAL_BASED_OUTPATIENT_CLINIC_OR_DEPARTMENT_OTHER): Payer: Self-pay | Admitting: Obstetrics & Gynecology

## 2023-08-01 DIAGNOSIS — O0990 Supervision of high risk pregnancy, unspecified, unspecified trimester: Secondary | ICD-10-CM

## 2023-08-01 LAB — CERVICOVAGINAL ANCILLARY ONLY
Chlamydia: NEGATIVE
Comment: NEGATIVE
Comment: NORMAL
Neisseria Gonorrhea: NEGATIVE

## 2023-08-04 LAB — STREP GP B CULTURE+RFLX: Strep Gp B Culture+Rflx: NEGATIVE

## 2023-08-05 ENCOUNTER — Ambulatory Visit (HOSPITAL_BASED_OUTPATIENT_CLINIC_OR_DEPARTMENT_OTHER): Admitting: Obstetrics and Gynecology

## 2023-08-05 ENCOUNTER — Ambulatory Visit: Attending: Obstetrics and Gynecology

## 2023-08-05 DIAGNOSIS — O358XX0 Maternal care for other (suspected) fetal abnormality and damage, not applicable or unspecified: Secondary | ICD-10-CM

## 2023-08-05 DIAGNOSIS — Z3A36 36 weeks gestation of pregnancy: Secondary | ICD-10-CM | POA: Diagnosis not present

## 2023-08-05 DIAGNOSIS — O99891 Other specified diseases and conditions complicating pregnancy: Secondary | ICD-10-CM | POA: Insufficient documentation

## 2023-08-05 DIAGNOSIS — M329 Systemic lupus erythematosus, unspecified: Secondary | ICD-10-CM | POA: Insufficient documentation

## 2023-08-05 DIAGNOSIS — O0990 Supervision of high risk pregnancy, unspecified, unspecified trimester: Secondary | ICD-10-CM | POA: Insufficient documentation

## 2023-08-05 NOTE — Progress Notes (Signed)
 After review, MFM consult with provider is not indicated for today  Meagan Clever, MD 08/05/2023 2:45 PM  [facility]

## 2023-08-07 ENCOUNTER — Encounter (HOSPITAL_BASED_OUTPATIENT_CLINIC_OR_DEPARTMENT_OTHER): Payer: Self-pay | Admitting: Obstetrics & Gynecology

## 2023-08-07 ENCOUNTER — Ambulatory Visit (HOSPITAL_BASED_OUTPATIENT_CLINIC_OR_DEPARTMENT_OTHER): Payer: Medicaid Other | Admitting: Obstetrics & Gynecology

## 2023-08-07 VITALS — BP 117/83 | HR 95 | Wt 176.8 lb

## 2023-08-07 DIAGNOSIS — Z3A37 37 weeks gestation of pregnancy: Secondary | ICD-10-CM | POA: Diagnosis not present

## 2023-08-07 DIAGNOSIS — O0990 Supervision of high risk pregnancy, unspecified, unspecified trimester: Secondary | ICD-10-CM

## 2023-08-07 DIAGNOSIS — F411 Generalized anxiety disorder: Secondary | ICD-10-CM

## 2023-08-07 DIAGNOSIS — M329 Systemic lupus erythematosus, unspecified: Secondary | ICD-10-CM

## 2023-08-07 DIAGNOSIS — O0993 Supervision of high risk pregnancy, unspecified, third trimester: Secondary | ICD-10-CM

## 2023-08-07 DIAGNOSIS — F3162 Bipolar disorder, current episode mixed, moderate: Secondary | ICD-10-CM

## 2023-08-07 NOTE — Progress Notes (Signed)
   PRENATAL VISIT NOTE  Subjective:  Meagan Hunter is a 33 y.o. [phone] at [redacted]w[redacted]d being seen today for ongoing prenatal care.  She is currently monitored for the following issues for this high-risk pregnancy and has Mixed connective tissue disease (HCC)?; ADHD (attention deficit hyperactivity disorder), combined type; Generalized anxiety disorder; Bipolar disorder, current episode mixed, moderate (HCC); Supervision of high risk pregnancy, antepartum; and Systemic lupus erythematosus (HCC) on their problem list.  Patient reports some mild contractions.  Contractions: Not present. Vag. Bleeding: None.  Movement: Present. Denies leaking of fluid.   The following portions of the patient's history were reviewed and updated as appropriate: allergies, current medications, past family history, past medical history, past social history, past surgical history and problem list.   Objective:   Vitals:   08/07/23 1435  BP: 117/83  Pulse: 95  Weight: 176 lb 12.8 oz (80.2 kg)    Fetal Status: Fetal Heart Rate (bpm): 147 Fundal Height: 37 cm Movement: Present  Presentation: Vertex  General:  Alert, oriented and cooperative. Patient is in no acute distress.  Skin: Skin is warm and dry. No rash noted.   Cardiovascular: Normal heart rate noted  Respiratory: Normal respiratory effort, no problems with respiration noted  Abdomen: Soft, gravid, appropriate for gestational age.  Pain/Pressure: Present     Pelvic: Cervical exam deferred Dilation: 3.5 Effacement (%): 70 Station: -3  Extremities: Normal range of motion.  Edema: None  Mental Status: Normal mood and affect. Normal behavior. Normal judgment and thought content.   Assessment and Plan:  Pregnancy: F7P1025 at [redacted]w[redacted]d 1. Supervision of high risk pregnancy, antepartum (Primary) - on PNV and baby ASA - recheck 1 weeks  2. [redacted] weeks gestation of pregnancy  3. Systemic lupus erythematosus, unspecified SLE type, unspecified organ involvement status  (HCC) - weekly NSTs - growth scan done 6/10, has been having monthly growth scans  4. Bipolar disorder, current episode mixed, moderate (HCC)  5. Generalized anxiety disorder   Term labor symptoms and general obstetric precautions including but not limited to vaginal bleeding, contractions, leaking of fluid and fetal movement were reviewed in detail with the patient. Please refer to After Visit Summary for other counseling recommendations.   Return in about 1 week (around 08/14/2023) for appt and NST.  Future Appointments  Date Time Provider Department Center  08/14/2023 10:15 AM Lillian Rein, MD [facility]  08/19/2023  3:15 PM Hilda Lovings, Juvenal Opoka, CNM [facility]  08/21/2023 12:00 AM [facility] SCHED ROOM [facility] None  08/28/2023  1:15 PM Lillian Rein, MD [facility]  10/15/2023  8:00 AM Matt Song, MD [facility] None    Lillian Rein, MD

## 2023-08-13 ENCOUNTER — Encounter (HOSPITAL_BASED_OUTPATIENT_CLINIC_OR_DEPARTMENT_OTHER): Admitting: Obstetrics & Gynecology

## 2023-08-14 ENCOUNTER — Encounter (HOSPITAL_BASED_OUTPATIENT_CLINIC_OR_DEPARTMENT_OTHER): Payer: Medicaid Other | Admitting: Obstetrics & Gynecology

## 2023-08-14 ENCOUNTER — Inpatient Hospital Stay (HOSPITAL_COMMUNITY)
Admission: AD | Admit: 2023-08-14 | Discharge: 2023-08-14 | Disposition: A | Discharge status: Home / Self Care | Attending: Obstetrics and Gynecology | Admitting: Obstetrics and Gynecology

## 2023-08-14 ENCOUNTER — Ambulatory Visit (HOSPITAL_BASED_OUTPATIENT_CLINIC_OR_DEPARTMENT_OTHER): Admitting: Obstetrics & Gynecology

## 2023-08-14 ENCOUNTER — Encounter (HOSPITAL_COMMUNITY): Payer: Self-pay | Admitting: Obstetrics and Gynecology

## 2023-08-14 VITALS — BP 134/82 | HR 110 | Wt 177.4 lb

## 2023-08-14 DIAGNOSIS — M329 Systemic lupus erythematosus, unspecified: Secondary | ICD-10-CM

## 2023-08-14 DIAGNOSIS — O479 False labor, unspecified: Secondary | ICD-10-CM

## 2023-08-14 DIAGNOSIS — F411 Generalized anxiety disorder: Secondary | ICD-10-CM

## 2023-08-14 DIAGNOSIS — Z3A38 38 weeks gestation of pregnancy: Secondary | ICD-10-CM | POA: Insufficient documentation

## 2023-08-14 DIAGNOSIS — O0993 Supervision of high risk pregnancy, unspecified, third trimester: Secondary | ICD-10-CM | POA: Diagnosis not present

## 2023-08-14 DIAGNOSIS — O0990 Supervision of high risk pregnancy, unspecified, unspecified trimester: Secondary | ICD-10-CM

## 2023-08-14 DIAGNOSIS — O471 False labor at or after 37 completed weeks of gestation: Secondary | ICD-10-CM | POA: Diagnosis present

## 2023-08-14 DIAGNOSIS — Z3689 Encounter for other specified antenatal screening: Secondary | ICD-10-CM | POA: Diagnosis not present

## 2023-08-14 NOTE — Progress Notes (Signed)
   PRENATAL VISIT NOTE  Subjective:  Meagan Hunter is a 33 y.o. G5P3013 at [redacted]w[redacted]d being seen today for ongoing prenatal care.  She is currently monitored for the following issues for this high-risk pregnancy and has Mixed connective tissue disease (HCC)?; ADHD (attention deficit hyperactivity disorder), combined type; Generalized anxiety disorder; Bipolar disorder, current episode mixed, moderate (HCC); Supervision of high risk pregnancy, antepartum; and Systemic lupus erythematosus (HCC) on their problem list.  Patient reports some contractions overnight.  Good FM.  Contractions: Irregular. Vag. Bleeding: None.  Movement: Present. Denies leaking of fluid.   The following portions of the patient's history were reviewed and updated as appropriate: allergies, current medications, past family history, past medical history, past social history, past surgical history and problem list.   Objective:   Vitals:   08/14/23 1043  BP: 134/82  Pulse: (!) 110  Weight: 177 lb 6.4 oz (80.5 kg)    Fetal Status:   Fundal Height: 38 cm  Movement: Present Presentation: Vertex  General:  Alert, oriented and cooperative. Patient is in no acute distress.  Skin: Skin is warm and dry. No rash noted.   Cardiovascular: Normal heart rate noted  Respiratory: Normal respiratory effort, no problems with respiration noted  Abdomen: Soft, gravid, appropriate for gestational age.  Pain/Pressure: Present     Pelvic: Cervical exam performed in the presence of a chaperone Dilation: 4 Effacement (%): 50 Station: -3  Extremities: Normal range of motion.  Edema: None  Mental Status: Normal mood and affect. Normal behavior. Normal judgment and thought content.   Assessment and Plan:  Pregnancy: G5P3013 at [redacted]w[redacted]d 1. Supervision of high risk pregnancy, antepartum (Primary) - on baby ASA and PNV - recheck next week  2. [redacted] weeks gestation of pregnancy  3. Systemic lupus erythematosus, unspecified SLE type, unspecified organ  involvement status (HCC) - induction scheduled for 39 weeks.  Orders placed. - Reactive NST today.  Weekly NSTs recommended by MFM.  4. Generalized anxiety disorder   Term labor symptoms and general obstetric precautions including but not limited to vaginal bleeding, contractions, leaking of fluid and fetal movement were reviewed in detail with the patient. Please refer to After Visit Summary for other counseling recommendations.   Return in about 1 week (around 08/21/2023).  Future Appointments  Date Time Provider Department Center  08/19/2023  3:15 PM Yolanda Hence, CNM [facility]  08/21/2023  7:00 AM [facility] SCHED ROOM [facility] None  10/15/2023  8:00 AM Rice, Haig Levan, MD [facility] None    Lillian Rein, MD

## 2023-08-14 NOTE — MAU Note (Signed)
 MAU Labor Triage Note:  .Meagan Hunter is a 33 y.o. at [redacted]w[redacted]d here in MAU reporting:  Contractions every: 6 minutes Onset of ctx: 5pm    ROM: intact Vaginal Bleeding: none Last SVE: 4cm Labor Pain Management Plan: Undecided  GBS: Negative  Fetal Movement: Reports positive FM    Lab orders placed from triage: MAU Labor Eval, appropriate provider to be notified based on nursing assessment. OB Office: [facility]

## 2023-08-14 NOTE — MAU Provider Note (Signed)
 S: [name] is a 33 y.o. [phone] at [redacted]w[redacted]d  who presents to MAU today for labor evaluation.     Cervical exam by RN:  Dilation: 4 Effacement (%): 50 Station: -3 Presentation: Vertex Exam by:: Mickeal Aland, RN  Fetal Monitoring: Baseline: 140 Variability: moderate Accelerations: present Decelerations: absent Contractions: irregular  MDM Discussed patient with RN. NST reviewed.   A: SIUP at [redacted]w[redacted]d  False labor  P: Discharge home Labor precautions and kick counts included in AVS Patient to follow-up with primary OB as scheduled  Patient may return to MAU as needed or when in labor   Maud Sorenson, MD 08/14/2023 10:24 PM

## 2023-08-15 ENCOUNTER — Telehealth (HOSPITAL_COMMUNITY): Payer: Self-pay | Admitting: *Deleted

## 2023-08-15 NOTE — Telephone Encounter (Signed)
 Preadmission screen

## 2023-08-18 ENCOUNTER — Telehealth (HOSPITAL_COMMUNITY): Payer: Self-pay | Admitting: *Deleted

## 2023-08-18 NOTE — Telephone Encounter (Signed)
 Preadmission screen

## 2023-08-19 ENCOUNTER — Ambulatory Visit (INDEPENDENT_AMBULATORY_CARE_PROVIDER_SITE_OTHER): Admitting: Certified Nurse Midwife

## 2023-08-19 ENCOUNTER — Encounter (HOSPITAL_BASED_OUTPATIENT_CLINIC_OR_DEPARTMENT_OTHER): Payer: Self-pay | Admitting: Certified Nurse Midwife

## 2023-08-19 ENCOUNTER — Other Ambulatory Visit (HOSPITAL_BASED_OUTPATIENT_CLINIC_OR_DEPARTMENT_OTHER): Payer: Self-pay | Admitting: Certified Nurse Midwife

## 2023-08-19 VITALS — BP 123/88 | HR 81 | Wt 180.0 lb

## 2023-08-19 DIAGNOSIS — O0993 Supervision of high risk pregnancy, unspecified, third trimester: Secondary | ICD-10-CM | POA: Diagnosis not present

## 2023-08-19 DIAGNOSIS — O219 Vomiting of pregnancy, unspecified: Secondary | ICD-10-CM

## 2023-08-19 DIAGNOSIS — O0990 Supervision of high risk pregnancy, unspecified, unspecified trimester: Secondary | ICD-10-CM

## 2023-08-19 DIAGNOSIS — Z3A38 38 weeks gestation of pregnancy: Secondary | ICD-10-CM

## 2023-08-19 MED ORDER — ONDANSETRON 8 MG PO TBDP
8.0000 mg | ORAL_TABLET | Freq: Three times a day (TID) | ORAL | 0 refills | Status: DC | PRN
Start: 2023-08-19 — End: 2023-08-24

## 2023-08-19 NOTE — Progress Notes (Signed)
    PRENATAL VISIT NOTE  Subjective:  Meagan Hunter is a 33 y.o. [id number] at [redacted]w[redacted]d being seen today for ongoing prenatal care.  She is currently monitored for the following issues for this  pregnancy and has Mixed connective tissue disease (HCC)?; ADHD (attention deficit hyperactivity disorder), combined type; Generalized anxiety disorder; Bipolar disorder, current episode mixed, moderate (HCC); Supervision of high risk pregnancy, antepartum; and Systemic lupus erythematosus (HCC) on their problem list.  Patient reports no bleeding and no leaking.  Contractions: Irregular. Vag. Bleeding: None.  Movement: Present. Denies leaking of fluid.   The following portions of the patient's history were reviewed and updated as appropriate: allergies, current medications, past family history, past medical history, past social history, past surgical history and problem list.   Objective:    Vitals:   08/19/23 1536  BP: 123/88  Pulse: 81  Weight: 180 lb (81.6 kg)    Fetal Status:      Movement: Present    General: Alert, oriented and cooperative. Patient is in no acute distress.  Skin: Skin is warm and dry. No rash noted.   Cardiovascular: Normal heart rate noted  Respiratory: Normal respiratory effort, no problems with respiration noted  Abdomen: Soft, gravid, appropriate for gestational age.  Pain/Pressure: Present     Pelvic: Cervical exam deferred        Extremities: Normal range of motion.  Edema: None  Mental Status: Normal mood and affect. Normal behavior. Normal judgment and thought content.   Assessment and Plan:  Pregnancy: [id number] at [redacted]w[redacted]d  1. Supervision of high risk pregnancy, antepartum (Primary) - on baby ASA and PNV - recheck next week   2. [redacted] weeks gestation of pregnancy   3. Systemic lupus erythematosus, unspecified SLE type, unspecified organ involvement status (HCC) - induction scheduled for 39 weeks   4. Generalized anxiety disorder     Term labor symptoms and  general obstetric precautions including but not limited to vaginal bleeding, contractions, leaking of fluid and fetal movement were reviewed in detail with the patient. Please refer to After Visit Summary for other counseling recommendations.   No follow-ups on file.  Future Appointments  Date Time Provider Department Center  08/21/2023  7:00 AM [facility] SCHED ROOM [facility] None  10/15/2023  8:00 AM Rice, Lonni ORN, MD [facility] None    Arland MARLA Roller, CNM

## 2023-08-20 ENCOUNTER — Encounter (HOSPITAL_BASED_OUTPATIENT_CLINIC_OR_DEPARTMENT_OTHER): Payer: Self-pay | Admitting: Certified Nurse Midwife

## 2023-08-20 ENCOUNTER — Telehealth (HOSPITAL_COMMUNITY): Payer: Self-pay | Admitting: *Deleted

## 2023-08-20 NOTE — Telephone Encounter (Signed)
 Preadmission screen

## 2023-08-21 ENCOUNTER — Inpatient Hospital Stay (HOSPITAL_COMMUNITY)

## 2023-08-21 ENCOUNTER — Encounter (HOSPITAL_BASED_OUTPATIENT_CLINIC_OR_DEPARTMENT_OTHER): Payer: Medicaid Other | Admitting: Obstetrics & Gynecology

## 2023-08-22 ENCOUNTER — Inpatient Hospital Stay (HOSPITAL_COMMUNITY)
Admission: AD | Admit: 2023-08-22 | Discharge: 2023-08-24 | DRG: 797 | Disposition: A | Discharge status: Home / Self Care | Attending: Obstetrics and Gynecology | Admitting: Obstetrics and Gynecology

## 2023-08-22 ENCOUNTER — Inpatient Hospital Stay (HOSPITAL_COMMUNITY): Admitting: Anesthesiology

## 2023-08-22 ENCOUNTER — Encounter (HOSPITAL_COMMUNITY): Payer: Self-pay | Admitting: Obstetrics & Gynecology

## 2023-08-22 DIAGNOSIS — Z8249 Family history of ischemic heart disease and other diseases of the circulatory system: Secondary | ICD-10-CM

## 2023-08-22 DIAGNOSIS — M351 Other overlap syndromes: Secondary | ICD-10-CM | POA: Diagnosis present

## 2023-08-22 DIAGNOSIS — O9962 Diseases of the digestive system complicating childbirth: Secondary | ICD-10-CM | POA: Diagnosis present

## 2023-08-22 DIAGNOSIS — G894 Chronic pain syndrome: Secondary | ICD-10-CM | POA: Diagnosis present

## 2023-08-22 DIAGNOSIS — Z880 Allergy status to penicillin: Secondary | ICD-10-CM

## 2023-08-22 DIAGNOSIS — O99892 Other specified diseases and conditions complicating childbirth: Principal | ICD-10-CM | POA: Diagnosis present

## 2023-08-22 DIAGNOSIS — K449 Diaphragmatic hernia without obstruction or gangrene: Secondary | ICD-10-CM | POA: Diagnosis present

## 2023-08-22 DIAGNOSIS — F3162 Bipolar disorder, current episode mixed, moderate: Secondary | ICD-10-CM | POA: Diagnosis present

## 2023-08-22 DIAGNOSIS — Z87891 Personal history of nicotine dependence: Secondary | ICD-10-CM

## 2023-08-22 DIAGNOSIS — F411 Generalized anxiety disorder: Secondary | ICD-10-CM | POA: Diagnosis present

## 2023-08-22 DIAGNOSIS — Z3A39 39 weeks gestation of pregnancy: Principal | ICD-10-CM

## 2023-08-22 DIAGNOSIS — F902 Attention-deficit hyperactivity disorder, combined type: Secondary | ICD-10-CM | POA: Diagnosis present

## 2023-08-22 DIAGNOSIS — O0990 Supervision of high risk pregnancy, unspecified, unspecified trimester: Secondary | ICD-10-CM

## 2023-08-22 DIAGNOSIS — M329 Systemic lupus erythematosus, unspecified: Secondary | ICD-10-CM | POA: Diagnosis present

## 2023-08-22 DIAGNOSIS — Z302 Encounter for sterilization: Secondary | ICD-10-CM | POA: Diagnosis not present

## 2023-08-22 DIAGNOSIS — K219 Gastro-esophageal reflux disease without esophagitis: Secondary | ICD-10-CM | POA: Diagnosis present

## 2023-08-22 HISTORY — DX: 39 weeks gestation of pregnancy: Z3A.39

## 2023-08-22 LAB — CBC
HCT: 36.6 % (ref 36.0–46.0)
Hemoglobin: 12.5 g/dL (ref 12.0–15.0)
MCH: 32.3 pg (ref 26.0–34.0)
MCHC: 34.2 g/dL (ref 30.0–36.0)
MCV: 94.6 fL (ref 80.0–100.0)
Platelets: 150 10*3/uL (ref 150–400)
RBC: 3.87 MIL/uL (ref 3.87–5.11)
RDW: 12.6 % (ref 11.5–15.5)
WBC: 9.3 10*3/uL (ref 4.0–10.5)
nRBC: 0 % (ref 0.0–0.2)

## 2023-08-22 LAB — TYPE AND SCREEN
ABO/RH(D): O POS
Antibody Screen: NEGATIVE

## 2023-08-22 MED ORDER — ZOLPIDEM TARTRATE 5 MG PO TABS: 5.0000 mg | ORAL_TABLET | Freq: Every evening | ORAL | Status: DC | PRN

## 2023-08-22 MED ORDER — ONDANSETRON HCL 4 MG/2ML IJ SOLN
4.0000 mg | Freq: Four times a day (QID) | INTRAMUSCULAR | Status: DC | PRN
  Administered 2023-08-22: 4 mg via INTRAVENOUS
  Filled 2023-08-22: qty 2

## 2023-08-22 MED ORDER — OXYCODONE-ACETAMINOPHEN 5-325 MG PO TABS: 1.0000 | ORAL_TABLET | ORAL | Status: DC | PRN

## 2023-08-22 MED ORDER — EPHEDRINE 5 MG/ML INJ
10.0000 mg | INTRAVENOUS | Status: DC | PRN
  Filled 2023-08-22: qty 5

## 2023-08-22 MED ORDER — PRENATAL MULTIVITAMIN CH
1.0000 | ORAL_TABLET | Freq: Every day | ORAL | Status: DC
  Administered 2023-08-24: 1 via ORAL
  Filled 2023-08-22: qty 1

## 2023-08-22 MED ORDER — DIPHENHYDRAMINE HCL 25 MG PO CAPS: 25.0000 mg | ORAL_CAPSULE | Freq: Four times a day (QID) | ORAL | Status: DC | PRN

## 2023-08-22 MED ORDER — FLEET ENEMA RE ENEM: 1.0000 | ENEMA | RECTAL | Status: DC | PRN

## 2023-08-22 MED ORDER — LIDOCAINE-EPINEPHRINE (PF) 1.5 %-1:200000 IJ SOLN
INTRAMUSCULAR | Status: DC | PRN
  Administered 2023-08-22: 3 mL via EPIDURAL
  Administered 2023-08-22: 2 mL via EPIDURAL

## 2023-08-22 MED ORDER — TETANUS-DIPHTH-ACELL PERTUSSIS 5-2.5-18.5 LF-MCG/0.5 IM SUSY: 0.5000 mL | PREFILLED_SYRINGE | Freq: Once | INTRAMUSCULAR | Status: DC

## 2023-08-22 MED ORDER — SOD CITRATE-CITRIC ACID 500-334 MG/5ML PO SOLN: 30.0000 mL | ORAL | Status: DC | PRN

## 2023-08-22 MED ORDER — COCONUT OIL OIL: 1.0000 | TOPICAL_OIL | Status: DC | PRN

## 2023-08-22 MED ORDER — TERBUTALINE SULFATE 1 MG/ML IJ SOLN: 0.2500 mg | Freq: Once | INTRAMUSCULAR | Status: DC | PRN

## 2023-08-22 MED ORDER — LACTATED RINGERS IV SOLN: 500.0000 mL | Freq: Once | INTRAVENOUS | Status: DC

## 2023-08-22 MED ORDER — LACTATED RINGERS IV SOLN: INTRAVENOUS | Status: DC

## 2023-08-22 MED ORDER — EPHEDRINE 5 MG/ML INJ: 10.0000 mg | INTRAVENOUS | Status: DC | PRN

## 2023-08-22 MED ORDER — OXYTOCIN BOLUS FROM INFUSION
333.0000 mL | Freq: Once | INTRAVENOUS | Status: AC
  Administered 2023-08-22: 333 mL via INTRAVENOUS

## 2023-08-22 MED ORDER — SIMETHICONE 80 MG PO CHEW: 80.0000 mg | CHEWABLE_TABLET | ORAL | Status: DC | PRN

## 2023-08-22 MED ORDER — PHENYLEPHRINE 80 MCG/ML (10ML) SYRINGE FOR IV PUSH (FOR BLOOD PRESSURE SUPPORT)
80.0000 ug | PREFILLED_SYRINGE | INTRAVENOUS | Status: DC | PRN
  Administered 2023-08-22: 80 ug via INTRAVENOUS
  Filled 2023-08-22: qty 10

## 2023-08-22 MED ORDER — BENZOCAINE-MENTHOL 20-0.5 % EX AERO
1.0000 | INHALATION_SPRAY | CUTANEOUS | Status: DC | PRN
  Filled 2023-08-22: qty 56

## 2023-08-22 MED ORDER — FENTANYL-BUPIVACAINE-NACL 0.5-0.125-0.9 MG/250ML-% EP SOLN
12.0000 mL/h | EPIDURAL | Status: DC | PRN
  Administered 2023-08-22: 12 mL/h via EPIDURAL
  Filled 2023-08-22: qty 250

## 2023-08-22 MED ORDER — WITCH HAZEL-GLYCERIN EX PADS: 1.0000 | MEDICATED_PAD | CUTANEOUS | Status: DC | PRN

## 2023-08-22 MED ORDER — ONDANSETRON HCL 4 MG PO TABS
4.0000 mg | ORAL_TABLET | ORAL | Status: DC | PRN
  Administered 2023-08-23 – 2023-08-24 (×2): 4 mg via ORAL
  Filled 2023-08-22 (×2): qty 1

## 2023-08-22 MED ORDER — OXYCODONE-ACETAMINOPHEN 5-325 MG PO TABS: 2.0000 | ORAL_TABLET | ORAL | Status: DC | PRN

## 2023-08-22 MED ORDER — IBUPROFEN 600 MG PO TABS
600.0000 mg | ORAL_TABLET | Freq: Four times a day (QID) | ORAL | Status: DC
  Administered 2023-08-22 – 2023-08-24 (×7): 600 mg via ORAL
  Filled 2023-08-22 (×7): qty 1

## 2023-08-22 MED ORDER — OXYTOCIN-SODIUM CHLORIDE 30-0.9 UT/500ML-% IV SOLN
1.0000 m[IU]/min | INTRAVENOUS | Status: DC
  Administered 2023-08-22: 2 m[IU]/min via INTRAVENOUS
  Filled 2023-08-22: qty 500

## 2023-08-22 MED ORDER — SENNOSIDES-DOCUSATE SODIUM 8.6-50 MG PO TABS
2.0000 | ORAL_TABLET | Freq: Every day | ORAL | Status: DC
Start: 2023-08-23 — End: 2023-08-24
  Administered 2023-08-24: 2 via ORAL
  Filled 2023-08-22: qty 2

## 2023-08-22 MED ORDER — DIBUCAINE (PERIANAL) 1 % EX OINT: 1.0000 | TOPICAL_OINTMENT | CUTANEOUS | Status: DC | PRN

## 2023-08-22 MED ORDER — OXYTOCIN-SODIUM CHLORIDE 30-0.9 UT/500ML-% IV SOLN: 2.5000 [IU]/h | INTRAVENOUS | Status: DC

## 2023-08-22 MED ORDER — LACTATED RINGERS IV SOLN: 500.0000 mL | INTRAVENOUS | Status: DC | PRN

## 2023-08-22 MED ORDER — ACETAMINOPHEN 325 MG PO TABS: 650.0000 mg | ORAL_TABLET | ORAL | Status: DC | PRN

## 2023-08-22 MED ORDER — PHENYLEPHRINE 80 MCG/ML (10ML) SYRINGE FOR IV PUSH (FOR BLOOD PRESSURE SUPPORT)
80.0000 ug | PREFILLED_SYRINGE | INTRAVENOUS | Status: AC | PRN
Start: 2023-08-22 — End: 2023-08-22
  Administered 2023-08-22 (×3): 80 ug via INTRAVENOUS

## 2023-08-22 MED ORDER — DIPHENHYDRAMINE HCL 50 MG/ML IJ SOLN: 12.5000 mg | INTRAMUSCULAR | Status: DC | PRN

## 2023-08-22 MED ORDER — ACETAMINOPHEN 325 MG PO TABS
650.0000 mg | ORAL_TABLET | ORAL | Status: DC | PRN
  Administered 2023-08-22 – 2023-08-24 (×8): 650 mg via ORAL
  Filled 2023-08-22 (×8): qty 2

## 2023-08-22 MED ORDER — LIDOCAINE HCL (PF) 1 % IJ SOLN: 30.0000 mL | INTRAMUSCULAR | Status: DC | PRN

## 2023-08-22 MED ORDER — ONDANSETRON HCL 4 MG/2ML IJ SOLN
4.0000 mg | INTRAMUSCULAR | Status: DC | PRN
  Administered 2023-08-23: 4 mg via INTRAVENOUS
  Filled 2023-08-22: qty 2

## 2023-08-22 MED ORDER — OXYTOCIN-SODIUM CHLORIDE 30-0.9 UT/500ML-% IV SOLN: 1.0000 m[IU]/min | INTRAVENOUS | Status: DC

## 2023-08-22 NOTE — Lactation Note (Signed)
 This note was copied from a baby's chart. Lactation Consultation Note  Patient Name: Meagan Hunter Unijb'd Date: 08/22/2023 Age:33 hours Reason for consult: Initial assessment;Term Per MOB, infant is breastfeeding 20 to 30 minutes concern about milk supply. MOB feels he is constantly hungry inquired about pacifier. LC suggested MOB wait until 3 weeks postpartum to offer the pacifier. LC reviewed hand expression and MOB has colostrum, infant given 2 mls by spoon. MOB is experienced with breastfeeding see maternal data below. MOB knows if she latched infant on 1st breast for 20 minutes and if infant is still showing hunger cues off the 2nd breast during the same feeding and she can hand expressed after the latch to offer more EBM. MOB will continue to breastfeed infant by cues, on demand, 8+ times within 24 hours, skin to skin. MOB informed LC since birth infant had 3 voids and 3 stools. MOB does have DEBP at home. LC discussed importance of maternal rest, meals and hydration. MOB was made aware of O/P services, breastfeeding support groups, community resources, and our phone # for post-discharge questions.    Maternal Data Has patient been taught Hand Expression?: Yes Does the patient have breastfeeding experience prior to this delivery?: Yes How long did the patient breastfeed?: Per MOB, she breastfeed her 1st child for 4 months, 2nd child for 2 years and 3 rd child who is currently 51 years old for 6 months.  Feeding Mother's Current Feeding Choice: Breast Milk  LATCH Score  LC did not observe latch due to infant recently breastfeeding prior to [facility] entering the room.                   Lactation Tools Discussed/Used    Interventions Interventions: Breast feeding basics reviewed;Skin to skin;Hand express;DEBP;Education;Guidelines for Milk Supply and Pumping Schedule Handout;LC Services brochure;CDC milk storage guidelines;CDC Guidelines for Breast Pump Cleaning  Discharge Pump:  DEBP;Personal  Consult Status Consult Status: Follow-up Date: 08/23/23 Follow-up type: In-patient    [name] 08/22/2023, 10:28 PM

## 2023-08-22 NOTE — Anesthesia Procedure Notes (Signed)
 Epidural Patient location during procedure: OB Start time: 08/22/2023 4:29 PM End time: 08/22/2023 4:43 PM  Staffing Anesthesiologist: Darlyn Rush, MD Performed: anesthesiologist   Preanesthetic Checklist Completed: patient identified, IV checked, risks and benefits discussed, monitors and equipment checked, pre-op evaluation and timeout performed  Epidural Patient position: sitting Prep: DuraPrep and site prepped and draped Patient monitoring: heart rate, continuous pulse ox and blood pressure Approach: midline Location: L3-L4 Injection technique: LOR air and LOR saline  Needle:  Needle type: Tuohy  Needle gauge: 17 G Needle length: 9 cm Needle insertion depth: 6 cm Catheter type: closed end flexible Catheter size: 19 Gauge Catheter at skin depth: 12 cm Test dose: negative and 1.5% lidocaine  with Epi 1:200 K  Assessment Sensory level: T8 Events: blood not aspirated, no cerebrospinal fluid, injection not painful, no injection resistance, no paresthesia and negative IV test  Additional Notes Reason for block:procedure for pain

## 2023-08-22 NOTE — Discharge Summary (Signed)
 Postpartum Discharge Summary  Date of Service updated***     Patient Name: Meagan Hunter DOB: Nov 04, 1990 MRN: [id number]  Date of admission: 08/22/2023 Delivery date:08/22/2023 Delivering provider: CLAUDENE FRIDAY A Date of discharge: 08/22/2023  Admitting diagnosis: [redacted] weeks gestation of pregnancy [Z3A.39] Intrauterine pregnancy: [redacted]w[redacted]d     Secondary diagnosis:  Principal Problem:   [redacted] weeks gestation of pregnancy Active Problems:   Mixed connective tissue disease (HCC)?   ADHD (attention deficit hyperactivity disorder), combined type   Generalized anxiety disorder   Bipolar disorder, current episode mixed, moderate (HCC)   Supervision of high risk pregnancy, antepartum   Systemic lupus erythematosus (HCC)  Additional problems:     Discharge diagnosis: Term Pregnancy Delivered                                              Post partum procedures:{Postpartum procedures:23558} Augmentation: AROM and Pitocin  Complications: None  Hospital course:    Magnesium Sulfate received: No BMZ received: No Rhophylac:No MMR:N/A T-DaP:Given prenatally Flu: No RSV Vaccine received: No Transfusion:{Transfusion received:30440034}  Immunizations received: Immunization History  Administered Date(s) Administered   Influenza Split 12/28/2013   Tdap 06/13/2014, 10/13/2020, 07/15/2023    Physical exam  Vitals:   08/22/23 1755 08/22/23 1756 08/22/23 1806 08/22/23 1825  BP: 114/83 114/83 (!) 86/45 (!) 88/70  Pulse: 87 87 63 81  Resp:      Temp:      TempSrc:      Weight:      Height:       General: {Exam; general:21111117} Lochia: {Desc; appropriate/inappropriate:30686::appropriate} Uterine Fundus: {Desc; firm/soft:30687} Incision: {Exam; incision:21111123} DVT Evaluation: {Exam; dvt:2111122} Labs: Lab Results  Component Value Date   WBC 9.3 08/22/2023   HGB 12.5 08/22/2023   HCT 36.6 08/22/2023   MCV 94.6 08/22/2023   PLT 150 08/22/2023      Latest Ref Rng & Units  04/14/2023   11:23 AM  CMP  Glucose 70 - 99 mg/dL 73   BUN 6 - 20 mg/dL 9   Creatinine 9.42 - 8.99 mg/dL 9.28   Sodium 865 - 855 mmol/L 137   Potassium 3.5 - 5.2 mmol/L 3.7   Chloride 96 - 106 mmol/L 104   CO2 20 - 29 mmol/L 18   Calcium 8.7 - 10.2 mg/dL 8.9   Total Protein 6.0 - 8.5 g/dL 6.5   Total Bilirubin 0.0 - 1.2 mg/dL 0.2   Alkaline Phos 44 - 121 IU/L 55   AST 0 - 40 IU/L 15   ALT 0 - 32 IU/L 9    Edinburgh Score:    05/16/2022    8:58 AM  Edinburgh Postnatal Depression Scale Screening Tool  I have been able to laugh and see the funny side of things. 2  I have looked forward with enjoyment to things. 2  I have blamed myself unnecessarily when things went wrong. 3  I have been anxious or worried for no good reason. 3  I have felt scared or panicky for no good reason. 3  Things have been getting on top of me. 2  I have been so unhappy that I have had difficulty sleeping. 0  I have felt sad or miserable. 2  I have been so unhappy that I have been crying. 2  The thought of harming myself has occurred to me. 0  Edinburgh Postnatal Depression  Scale Total 19      Data saved with a previous flowsheet row definition   No data recorded  After visit meds:  Allergies as of 08/22/2023       Reactions   Penicillins Hives, Itching, Nausea And Vomiting, Other (See Comments)   Other reaction(s): Fever Fever, rash and vomiting Fever, rash and vomiting Other reaction(s): Fever Fever, rash and vomiting   Zoloft [sertraline] Other (See Comments)   seizures Other reaction(s): Seizures seizures     Med Rec must be completed prior to using this [facility]***        Discharge home in stable condition Infant Feeding: Breast Infant Disposition:home with mother Discharge instruction: per After Visit Summary and Postpartum booklet. Activity: Advance as tolerated. Pelvic rest for 6 weeks.  Diet: routine diet Future Appointments: Future Appointments  Date Time Provider  Department Center  10/15/2023  8:00 AM Rice, Lonni ORN, MD [facility] None   Follow up Visit:   Please schedule this patient for a In person postpartum visit in 6 weeks with the following provider: Any provider. Additional Postpartum F/U:  High risk pregnancy complicated by: SLE, mixed connective tissue disease  Delivery mode:  Vaginal, Spontaneous Anticipated Birth Control: Considering  pp BTL vs interval BTL, consents signed   08/22/2023 Wells DELENA Sharps, MD

## 2023-08-22 NOTE — Anesthesia Preprocedure Evaluation (Addendum)
 Anesthesia Evaluation  Patient identified by MRN, date of birth, ID band Patient awake    Reviewed: Allergy & Precautions, Patient's Chart, lab work & pertinent test results  Airway Mallampati: II  TM Distance: >3 FB Neck ROM: Full    Dental no notable dental hx. (+) Dental Advisory Given   Pulmonary asthma , former smoker   Pulmonary exam normal        Cardiovascular negative cardio ROS Normal cardiovascular exam  Echo 07/2022  1. Left ventricular ejection fraction, by estimation, is 60 to 65%. The left ventricle has normal function. The left ventricle has no regional wall motion abnormalities. Left ventricular diastolic parameters were normal. The average left ventricular global longitudinal strain is -20.0 %. The global longitudinal strain is normal.   2. Right ventricular systolic function is normal. The right ventricular size is normal.   3. The mitral valve is normal in structure. No evidence of mitral valve regurgitation.   4. The aortic valve is normal in structure. Aortic valve regurgitation is not visualized.   5. The inferior vena cava is normal in size with greater than 50% respiratory variability, suggesting right atrial pressure of 3 mmHg.   Conclusion(s)/Recommendation(s): Normal biventricular function without evidence of hemodynamically significant valvular heart disease.    Stress 07/2022   Patient exercised according to the BRUCE protocol for 10:35min achieving 11.7 METs consistent with good exercise capacity   Targe HR was achieved (179bpm; 95% MPHR)   No ST deviation was noted.   No electrocardiographic evidence of ischemia. No arrhythmias with stress.    Neuro/Psych  PSYCHIATRIC DISORDERS Anxiety Depression Bipolar Disorder   negative neurological ROS     GI/Hepatic Neg liver ROS, hiatal hernia, PUD,GERD  Medicated,,  Endo/Other  negative endocrine ROS  SLE  Renal/GU Renal disease      Musculoskeletal negative musculoskeletal ROS (+)    Abdominal   Peds  Hematology negative hematology ROS (+)   Anesthesia Other Findings   Reproductive/Obstetrics (+) Pregnancy                              Anesthesia Physical Anesthesia Plan  ASA: 2  Anesthesia Plan: Epidural   Post-op Pain Management:    Induction:   PONV Risk Score and Plan:   Airway Management Planned:   Additional Equipment:   Intra-op Plan:   Post-operative Plan:   Informed Consent: I have reviewed the patients History and Physical, chart, labs and discussed the procedure including the risks, benefits and alternatives for the proposed anesthesia with the patient or authorized representative who has indicated his/her understanding and acceptance.       Plan Discussed with:   Anesthesia Plan Comments:         Anesthesia Quick Evaluation

## 2023-08-22 NOTE — H&P (Signed)
 LABOR AND DELIVERY ADMISSION HISTORY AND PHYSICAL NOTE  Meagan Hunter is a 33 y.o. female [phone] with IUP at [redacted]w[redacted]d presenting for IOL for SLE/mixed connective tissue disease.   Patient reports the fetal movement as active. Patient reports uterine contraction activity as irregular. Patient reports vaginal bleeding as none. Patient describes fluid per vagina as None.   Patient denies headache, vision changes, chest pain, shortness of breath, right upper quadrant pain, or LE edema.  She plans on breast feeding. Her contraception plan is: bilateral tubal ligation?.  Prenatal History/Complications: PNC at [facility]  Sono:  @[redacted]w[redacted]d , CWD, normal anatomy, cephalic presentation, posterior placenta, 26%ile  Pregnancy complications:  Patient Active Problem List   Diagnosis Date Noted   [redacted] weeks gestation of pregnancy 08/22/2023   Systemic lupus erythematosus (HCC) 05/01/2023   Supervision of high risk pregnancy, antepartum 01/09/2023   Generalized anxiety disorder 02/28/2022   ADHD (attention deficit hyperactivity disorder), 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 06/04/2020   Bipolar disorder, current episode mixed, moderate (HCC) 06/20/2015    Past Medical History: Past Medical History:  Diagnosis Date   Anxiety    Asthma    Bipolar 1 disorder, depressed (HCC) 02/01/2016   Chronic pain syndrome 06/20/2015   Formatting of this note might be different from the original. 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     Past Surgical History: Past Surgical History:  Procedure Laterality Date   CYST EXCISION     EYE   ESOPHAGOGASTRODUODENOSCOPY      Obstetrical History: OB History     Gravida  5   Para  3   Term  3   Preterm  0   AB  1   Living  3      SAB  1   IAB  0   Ectopic  0   Multiple  0   Live Births  3           Social History: Social History   Socioeconomic History   Marital status: Media planner    Spouse name: Not on file   Number of children: 3   Years of education: Not on file   Highest education level: 12th grade  Occupational History   Occupation: stay at home mom  Tobacco Use   Smoking status: Former    Current packs/day: 0.00    Types: Cigarettes    Quit date: 02/26/2015    Years since quitting: 8.4   Smokeless tobacco: Never  Vaping Use   Vaping status: Never Used  Substance and Sexual Activity   Alcohol use: Not Currently    Alcohol/week: 2.0 standard drinks of alcohol    Types: 2 Standard drinks or equivalent per week    Comment: 3 per week   Drug use: Never   Sexual activity: Not Currently    Birth control/protection: None  Other Topics Concern   Not on file  Social History Narrative   Not on file   Social Drivers of Health   Financial Resource Strain: Low  Risk  (02/26/2023)   Overall Financial Resource Strain (CARDIA)    Difficulty of Paying Living Expenses: Not very hard  Food Insecurity: No Food Insecurity (02/26/2023)   Hunger Vital Sign    Worried About Running Out of Food in the Last Year: Never true    Ran Out of Food in the Last Year: Never true  Transportation Needs: No Transportation Needs (02/26/2023)   PRAPARE - Administrator, Civil Service (Medical): No    Lack of Transportation (Non-Medical): No  Physical Activity: Insufficiently Active (02/26/2023)   Exercise Vital Sign    Days of Exercise per Week: 3 days    Minutes of Exercise per Session: 10 min  Stress: No Stress Concern Present (02/26/2023)    [employer] of Occupational Health - Occupational Stress Questionnaire    Feeling of Stress : Not at all  Social Connections: Moderately Isolated (02/26/2023)   Social Connection and Isolation Panel    Frequency of Communication with Friends and Family: More than three times a week    Frequency of Social Gatherings with Friends and Family: Once a week    Attends Religious Services: Never    Database administrator or Organizations: No    Attends Engineer, structural: Never    Marital Status: Living with partner    Family History: Family History  Problem Relation Age of Onset   Hypertension Mother    Ovarian cancer Mother    Thyroid cancer Mother    Crohn's disease Mother    Hypertension Father    Hyperlipidemia Father    Heart failure Father    Hashimoto's thyroiditis Sister    Breast cancer Maternal Aunt    Heart failure Maternal Grandfather    Heart failure Paternal Grandfather    Colon cancer Paternal Grandfather     Allergies: Allergies  Allergen Reactions   Penicillins Hives, Itching, Nausea And Vomiting and Other (See Comments)    Other reaction(s): Fever Fever, rash and vomiting Fever, rash and vomiting Other reaction(s): Fever Fever, rash and vomiting   Zoloft [Sertraline] Other (See Comments)    seizures Other reaction(s): Seizures seizures    Medications Prior to Admission  Medication Sig Dispense Refill Last Dose/Taking   omeprazole  (PRILOSEC) 20 MG capsule Take 1 capsule (20 mg total) by mouth daily. 90 capsule 3    ondansetron  (ZOFRAN -ODT) 8 MG disintegrating tablet Take 1 tablet (8 mg total) by mouth every 8 (eight) hours as needed for nausea or vomiting. 10 tablet 0    Prenatal Vit-Fe Fumarate-FA (MULTIVITAMIN-PRENATAL) 27-0.8 MG TABS tablet Take 1 tablet by mouth daily at 12 noon.      promethazine  (PHENERGAN ) 25 MG tablet Take 1 tablet (25 mg total) by mouth every 6 (six) hours as needed for nausea or vomiting. 30 tablet 5    VENTOLIN  HFA  108 (90 Base) MCG/ACT inhaler Inhale 2 puffs into the lungs every 6 (six) hours as needed. 6.7 g 3      Review of Systems  All systems reviewed and negative except as stated in HPI  Physical Exam BP 139/87   Pulse (!) 122   Temp 98 F (36.7 C) (Oral)   Resp 18   Ht 5' 4 (1.626 m)   Wt 80.7 kg   LMP 11/21/2022   BMI 30.55 kg/m   Physical Exam  Presentation: cephalic by check  Fetal monitoring: Baseline: 140 bpm, Variability: Good {> 6 bpm), Accelerations: Reactive, and Decelerations: Absent Uterine activity: Irregular  Dilation:  4 Effacement (%): 70, 80 Cervical Position: Middle Station: -1 Presentation: Vertex Exam by:: Lima, rn  Prenatal labs: ABO, Rh: --/--/PENDING (06/27 1226) Antibody: PENDING (06/27 1226) Rubella: 8.70 (12/11 1135) RPR: Non Reactive (04/03 0829)  HBsAg: Negative (12/11 1135)  HIV: Non Reactive (04/03 0829)  GC/Chlamydia:  Neisseria Gonorrhea  Date Value Ref Range Status  07/31/2023 Negative  Final   Chlamydia  Date Value Ref Range Status  07/31/2023 Negative  Final   GBS: Negative/-- (06/05 0444)   Prenatal Transfer Tool  Maternal Diabetes: No Genetic Screening: Normal Maternal Ultrasounds/Referrals: Normal Fetal Ultrasounds or other Referrals:  None Maternal Substance Abuse:  No Significant Maternal Medications:  None Significant Maternal Lab Results: Group B Strep negative  Results for orders placed or performed during the hospital encounter of 08/22/23 (from the past 24 hours)  Type and screen [facility]   Collection Time: 08/22/23 12:26 PM  Result Value Ref Range   ABO/RH(D) PENDING    Antibody Screen PENDING    Sample Expiration      08/25/2023,2359 Performed at [facility], [address], [place], [place] [address]   CBC   Collection Time: 08/22/23 12:27 PM  Result Value Ref Range   WBC 9.3 4.0 - 10.5 K/uL   RBC 3.87 3.87 - 5.11 MIL/uL   Hemoglobin 12.5 12.0 - 15.0 g/dL   HCT 63.3 63.9 -  53.9 %   MCV 94.6 80.0 - 100.0 fL   MCH 32.3 26.0 - 34.0 pg   MCHC 34.2 30.0 - 36.0 g/dL   RDW 87.3 88.4 - 84.4 %   Platelets 150 150 - 400 K/uL   nRBC 0.0 0.0 - 0.2 %    Assessment: Meagan Hunter is a 33 y.o. H4E6986 at [redacted]w[redacted]d here for IOL 2/2 SLE/mixed connective tissue diease  #Labor: Favorable cervix. Prefers to start with pitocin , followed by AROM after epidural #Pain: IV pain meds PRN, epidural upon request #FHT: Category I #GBS/ID: Negative #MOF: breast feeding #MOC: considering PP BTL. Has papers signed 05/29/23  #Circ: No  #SLE: On no medications currently and without flare. Has appointment scheduled with rheum PP #Bipolar/GAD: Early PP depression/anxiety screen. Not on any current medications  Almarie CHRISTELLA Moats, MD [facility], [facility]  08/22/2023, 12:56 PM

## 2023-08-22 NOTE — Progress Notes (Signed)
 Labor Progress Note Meagan Hunter is a 33 y.o. H4E6986 at [redacted]w[redacted]d presented for IOL SLE/mixed connective tissue disease.  S: Feeling well. Amenable to AROM  O:  BP 122/87   Pulse 83   Temp 98 F (36.7 C) (Oral)   Resp 17   Ht 5' 4 (1.626 m)   Wt 80.7 kg   LMP 11/21/2022   BMI 30.55 kg/m  EFM: baseline 145 bpm/ moderate variability/ good accels/ no decels  Toco/IUPC: q 4 min SVE: Dilation: 4 Effacement (%): 80 Cervical Position: Middle Station: 0 Presentation: Vertex Exam by:: Lima, rn Pitocin : 8 mu/min  A/P: 33 y.o. H4E6986 [redacted]w[redacted]d  1. Labor: S/p AROM, clear fluids. Continue pitocin  up titration per protocol. 2. FWB: Cat 1 3. Pain: Desires epidural 4. GBS neg   Anticipate SVD.  Wells DELENA Sharps, MD 3:33 PM

## 2023-08-23 ENCOUNTER — Encounter (HOSPITAL_COMMUNITY)
Admission: AD | Disposition: A | Discharge status: Home / Self Care | Payer: Self-pay | Source: Home / Self Care | Attending: Obstetrics and Gynecology

## 2023-08-23 ENCOUNTER — Encounter (HOSPITAL_COMMUNITY): Payer: Self-pay | Admitting: Obstetrics & Gynecology

## 2023-08-23 ENCOUNTER — Inpatient Hospital Stay (HOSPITAL_COMMUNITY): Admitting: Anesthesiology

## 2023-08-23 ENCOUNTER — Other Ambulatory Visit: Payer: Self-pay

## 2023-08-23 DIAGNOSIS — Z302 Encounter for sterilization: Secondary | ICD-10-CM

## 2023-08-23 HISTORY — PX: TUBAL LIGATION: SHX77

## 2023-08-23 LAB — CBC
HCT: 32.5 % — ABNORMAL LOW (ref 36.0–46.0)
Hemoglobin: 10.9 g/dL — ABNORMAL LOW (ref 12.0–15.0)
MCH: 31.7 pg (ref 26.0–34.0)
MCHC: 33.5 g/dL (ref 30.0–36.0)
MCV: 94.5 fL (ref 80.0–100.0)
Platelets: 129 10*3/uL — ABNORMAL LOW (ref 150–400)
RBC: 3.44 MIL/uL — ABNORMAL LOW (ref 3.87–5.11)
RDW: 12.8 % (ref 11.5–15.5)
WBC: 10.5 10*3/uL (ref 4.0–10.5)
nRBC: 0 % (ref 0.0–0.2)

## 2023-08-23 LAB — RPR: RPR Ser Ql: NONREACTIVE

## 2023-08-23 SURGERY — LIGATION, FALLOPIAN TUBE, POSTPARTUM
Anesthesia: Epidural | Laterality: Bilateral

## 2023-08-23 MED ORDER — METOCLOPRAMIDE HCL 10 MG PO TABS
10.0000 mg | ORAL_TABLET | Freq: Once | ORAL | Status: AC
  Administered 2023-08-23: 10 mg via ORAL
  Filled 2023-08-23: qty 1

## 2023-08-23 MED ORDER — PHENYLEPHRINE HCL (PRESSORS) 10 MG/ML IV SOLN
INTRAVENOUS | Status: DC | PRN
  Administered 2023-08-23 (×2): 40 ug via INTRAVENOUS

## 2023-08-23 MED ORDER — DROPERIDOL 2.5 MG/ML IJ SOLN: 0.6250 mg | Freq: Once | INTRAMUSCULAR | Status: DC | PRN

## 2023-08-23 MED ORDER — CHLORHEXIDINE GLUCONATE CLOTH 2 % EX PADS: 6.0000 | MEDICATED_PAD | Freq: Every day | CUTANEOUS | Status: DC

## 2023-08-23 MED ORDER — LIDOCAINE-EPINEPHRINE (PF) 2 %-1:200000 IJ SOLN
INTRAMUSCULAR | Status: DC | PRN
Start: 2023-08-23 — End: 2023-08-23
  Administered 2023-08-23 (×4): 5 mg via INTRADERMAL

## 2023-08-23 MED ORDER — TRAMADOL HCL 50 MG PO TABS: 50.0000 mg | ORAL_TABLET | Freq: Four times a day (QID) | ORAL | Status: DC | PRN

## 2023-08-23 MED ORDER — BUPIVACAINE HCL (PF) 0.25 % IJ SOLN
INTRAMUSCULAR | Status: AC
  Filled 2023-08-23: qty 10

## 2023-08-23 MED ORDER — FAMOTIDINE 20 MG PO TABS
40.0000 mg | ORAL_TABLET | Freq: Once | ORAL | Status: AC
  Administered 2023-08-23: 40 mg via ORAL
  Filled 2023-08-23: qty 2

## 2023-08-23 MED ORDER — FENTANYL CITRATE (PF) 100 MCG/2ML IJ SOLN
INTRAMUSCULAR | Status: AC
Start: 2023-08-23 — End: 2023-08-23
  Filled 2023-08-23: qty 2

## 2023-08-23 MED ORDER — OXYCODONE HCL 5 MG PO TABS
5.0000 mg | ORAL_TABLET | ORAL | Status: DC | PRN
  Administered 2023-08-23 – 2023-08-24 (×7): 5 mg via ORAL
  Filled 2023-08-23 (×7): qty 1

## 2023-08-23 MED ORDER — MIDAZOLAM HCL 2 MG/2ML IJ SOLN
INTRAMUSCULAR | Status: DC | PRN
  Administered 2023-08-23 (×2): 2 mg via INTRAVENOUS

## 2023-08-23 MED ORDER — FENTANYL CITRATE (PF) 100 MCG/2ML IJ SOLN
INTRAMUSCULAR | Status: DC | PRN
  Administered 2023-08-23: 50 ug via INTRAVENOUS

## 2023-08-23 MED ORDER — DEXMEDETOMIDINE HCL IN NACL 80 MCG/20ML IV SOLN
INTRAVENOUS | Status: AC
  Filled 2023-08-23: qty 20

## 2023-08-23 MED ORDER — SODIUM CHLORIDE 0.9 % IR SOLN
Status: DC | PRN
  Administered 2023-08-23: 1

## 2023-08-23 MED ORDER — DEXMEDETOMIDINE HCL IN NACL 80 MCG/20ML IV SOLN
INTRAVENOUS | Status: DC | PRN
Start: 2023-08-23 — End: 2023-08-23
  Administered 2023-08-23 (×3): 10 ug via INTRAVENOUS

## 2023-08-23 MED ORDER — MEPERIDINE HCL 25 MG/ML IJ SOLN: 6.2500 mg | INTRAMUSCULAR | Status: DC | PRN

## 2023-08-23 MED ORDER — FENTANYL CITRATE (PF) 100 MCG/2ML IJ SOLN
INTRAMUSCULAR | Status: AC
  Filled 2023-08-23: qty 2

## 2023-08-23 MED ORDER — STERILE WATER FOR IRRIGATION IR SOLN
Status: DC | PRN
  Administered 2023-08-23: 1

## 2023-08-23 MED ORDER — METOCLOPRAMIDE HCL 5 MG/ML IJ SOLN
INTRAMUSCULAR | Status: DC | PRN
  Administered 2023-08-23: 10 mg via INTRAVENOUS

## 2023-08-23 MED ORDER — ONDANSETRON HCL 4 MG/2ML IJ SOLN
INTRAMUSCULAR | Status: DC | PRN
  Administered 2023-08-23: 4 mg via INTRAVENOUS

## 2023-08-23 MED ORDER — LACTATED RINGERS IV SOLN: INTRAVENOUS | Status: DC | PRN

## 2023-08-23 MED ORDER — FENTANYL CITRATE (PF) 100 MCG/2ML IJ SOLN
INTRAMUSCULAR | Status: DC | PRN
  Administered 2023-08-23: 100 ug via EPIDURAL

## 2023-08-23 MED ORDER — MIDAZOLAM HCL 2 MG/2ML IJ SOLN
INTRAMUSCULAR | Status: AC
Start: 2023-08-23 — End: 2023-08-23
  Filled 2023-08-23: qty 2

## 2023-08-23 MED ORDER — LACTATED RINGERS IV SOLN: INTRAVENOUS | Status: AC

## 2023-08-23 MED ORDER — HYDROMORPHONE HCL 1 MG/ML IJ SOLN: 0.2500 mg | INTRAMUSCULAR | Status: DC | PRN

## 2023-08-23 MED ORDER — MIDAZOLAM HCL 2 MG/2ML IJ SOLN
INTRAMUSCULAR | Status: AC
  Filled 2023-08-23: qty 2

## 2023-08-23 MED ORDER — BUPIVACAINE HCL (PF) 0.25 % IJ SOLN
INTRAMUSCULAR | Status: DC | PRN
  Administered 2023-08-23: 20 mL

## 2023-08-23 SURGICAL SUPPLY — 23 items
BLADE SURG 15 STRL LF C SS BP (BLADE) ×1 IMPLANT
CLOTH BEACON ORANGE TIMEOUT ST (SAFETY) ×1 IMPLANT
DERMABOND ADVANCED .7 DNX12 (GAUZE/BANDAGES/DRESSINGS) ×1 IMPLANT
DISSECTOR SURG LIGASURE 21 (MISCELLANEOUS) IMPLANT
DRSG OPSITE POSTOP 3X4 (GAUZE/BANDAGES/DRESSINGS) ×1 IMPLANT
ELECTRODE REM PT RTRN 9FT ADLT (ELECTROSURGICAL) IMPLANT
GLOVE BIOGEL PI IND STRL 7.0 (GLOVE) ×1 IMPLANT
GLOVE BIOGEL PI IND STRL 8 (GLOVE) ×1 IMPLANT
GLOVE ECLIPSE 8.0 STRL XLNG CF (GLOVE) ×1 IMPLANT
GOWN STRL REUS W/TWL LRG LVL3 (GOWN DISPOSABLE) ×2 IMPLANT
MAT PREVALON FULL STRYKER (MISCELLANEOUS) IMPLANT
NEEDLE HYPO 22GX1.5 SAFETY (NEEDLE) IMPLANT
NS IRRIG 1000ML POUR BTL (IV SOLUTION) ×1 IMPLANT
PACK ABDOMINAL MINOR (CUSTOM PROCEDURE TRAY) ×1 IMPLANT
PENCIL BUTTON HOLSTER BLD 10FT (ELECTRODE) IMPLANT
PROTECTOR NERVE ULNAR (MISCELLANEOUS) ×1 IMPLANT
SPONGE LAP 4X18 RFD (DISPOSABLE) IMPLANT
SUT PLAIN ABS 2-0 CT1 27XMFL (SUTURE) ×2 IMPLANT
SUT VIC AB 0 CT1 27XBRD ANBCTR (SUTURE) ×1 IMPLANT
SUT VIC AB 4-0 KS 27 (SUTURE) ×1 IMPLANT
SYR CONTROL 10ML LL (SYRINGE) IMPLANT
TOWEL OR 17X24 6PK STRL BLUE (TOWEL DISPOSABLE) ×2 IMPLANT
TRAY FOLEY CATH SILVER 14FR (SET/KITS/TRAYS/PACK) ×1 IMPLANT

## 2023-08-23 NOTE — Op Note (Signed)
  Preoperative diagnosis:  Multiparous female who desires permanent sterilization  Postoperative diagnosis:  Same as above  Procedure:  Postpartum partum bilateral salpingectomy using ligasure  Surgeon:  JAYNE MINDER H  Asst.:    Anesthesia:  epidural + IV sedation  Findings:  Patient had a normal postpartum uterus tubes and ovaries.  Description of operation:  Patient was taken to the operating room where she had her epidural dosed.  She was then placed in the supine position.  She was then prepped and draped in the usual sterile fashion and a Foley catheter was placed in the bladder after the spinal was dosed.  A semilunar incision was made just below the umbilicus and taken down sharply to the fascia which was incised.  The peritoneum was then entered manually.  The right fallopian tube was identified including the fimbriated end.  The right fallopian tube was grasped and the ligasure used to remove the entire tube.  There was good hemostasis.  Attention was then turned to the left fallopian tube which was identified including the fimbriated end. The left Fallopain tube was removed completely using the ligasure.  There was good hemostasis.    The subcutaneous tissue fascia and peritoneum were closed using 0 vicryl in a running fashion.  The subcutaneous tissue was reapproximated with interrupted 0 vicryl as well.  The skin was closed using 4-0 Vicryl on a Keith needle in a subcuticular fashion.  Dermabond was placed for additional wound integrity and also to serve as a bandage.  The patient was taken to the recovery room in good stable condition.  All counts were correct.  Blood loss was minimal.    MINDER VEAR JAYNE, MD 08/23/2023 1:18 PM

## 2023-08-23 NOTE — Anesthesia Postprocedure Evaluation (Signed)
 Anesthesia Post Note  Patient: Meagan Hunter  Procedure(s) Performed: AN AD HOC LABOR EPIDURAL     Patient location during evaluation: Mother Baby Anesthesia Type: Epidural Level of consciousness: awake and alert Pain management: pain level controlled Vital Signs Assessment: post-procedure vital signs reviewed and stable Respiratory status: spontaneous breathing, nonlabored ventilation and respiratory function stable Cardiovascular status: stable Postop Assessment: no headache, no backache and epidural receding Anesthetic complications: no   No notable events documented.  Last Vitals:  Vitals:   08/23/23 0125 08/23/23 0528  BP: 119/70 116/81  Pulse: 93 97  Resp: 17 17  Temp: 36.9 C 36.9 C  SpO2: 99%     Last Pain:  Vitals:   08/23/23 0728  TempSrc:   PainSc: 6    Pain Goal:                   Kalya Troeger

## 2023-08-23 NOTE — Anesthesia Preprocedure Evaluation (Addendum)
 Anesthesia Evaluation  Patient identified by MRN, date of birth, ID band Patient awake    Reviewed: Allergy & Precautions, NPO status , Patient's Chart, lab work & pertinent test results  Airway Mallampati: II  TM Distance: >3 FB Neck ROM: Full    Dental no notable dental hx. (+) Dental Advisory Given   Pulmonary asthma , former smoker   Pulmonary exam normal        Cardiovascular negative cardio ROS Normal cardiovascular exam Rhythm:Regular Rate:Normal  Echo 07/2022  1. Left ventricular ejection fraction, by estimation, is 60 to 65%. The left ventricle has normal function. The left ventricle has no regional wall motion abnormalities. Left ventricular diastolic parameters were normal. The average left ventricular global longitudinal strain is -20.0 %. The global longitudinal strain is normal.   2. Right ventricular systolic function is normal. The right ventricular size is normal.   3. The mitral valve is normal in structure. No evidence of mitral valve regurgitation.   4. The aortic valve is normal in structure. Aortic valve regurgitation is not visualized.   5. The inferior vena cava is normal in size with greater than 50% respiratory variability, suggesting right atrial pressure of 3 mmHg.   Conclusion(s)/Recommendation(s): Normal biventricular function without evidence of hemodynamically significant valvular heart disease.    Stress 07/2022   Patient exercised according to the BRUCE protocol for 10:71min achieving 11.7 METs consistent with good exercise capacity   Targe HR was achieved (179bpm; 95% MPHR)   No ST deviation was noted.   No electrocardiographic evidence of ischemia. No arrhythmias with stress.    Neuro/Psych  PSYCHIATRIC DISORDERS Anxiety Depression Bipolar Disorder   negative neurological ROS     GI/Hepatic Neg liver ROS, hiatal hernia, PUD,GERD  Medicated,,  Endo/Other  negative endocrine ROS  SLE   Renal/GU Renal disease     Musculoskeletal negative musculoskeletal ROS (+)    Abdominal   Peds  Hematology negative hematology ROS (+)   Anesthesia Other Findings   Reproductive/Obstetrics (+) Pregnancy                             Anesthesia Physical Anesthesia Plan  ASA: 2  Anesthesia Plan: Epidural   Post-op Pain Management: Tylenol  PO (pre-op)*, Precedex and Toradol  IV (intra-op)*   Induction:   PONV Risk Score and Plan: 4 or greater and Ondansetron , Treatment may vary due to age or medical condition and Dexamethasone  Airway Management Planned: Natural Airway  Additional Equipment:   Intra-op Plan:   Post-operative Plan:   Informed Consent: I have reviewed the patients History and Physical, chart, labs and discussed the procedure including the risks, benefits and alternatives for the proposed anesthesia with the patient or authorized representative who has indicated his/her understanding and acceptance.     Dental advisory given  Plan Discussed with: CRNA  Anesthesia Plan Comments: (Risks of anesthesia explained at length. This includes, but is not limited to, sore throat, damage to teeth, lips gums, tongue and vocal cords, nausea and vomiting, reactions to medications, stroke, heart attack, and death. All patient questions were answered and the patient wishes to proceed. )        Anesthesia Quick Evaluation

## 2023-08-23 NOTE — Transfer of Care (Signed)
 Immediate Anesthesia Transfer of Care Note  Patient: Meagan Hunter  Procedure(s) Performed: LIGATION, FALLOPIAN TUBE, POSTPARTUM (Bilateral)  Patient Location: PACU  Anesthesia Type:Epidural  Level of Consciousness: awake, alert , and oriented  Airway & Oxygen Therapy: Patient Spontanous Breathing  Post-op Assessment: Report given to RN and Post -op Vital signs reviewed and stable  Post vital signs: Reviewed and stable  Last Vitals:  Vitals Value Taken Time  BP 93/49 08/23/23 13:33  Temp 36.6 C 08/23/23 13:33  Pulse 74 08/23/23 13:36  Resp 15 08/23/23 13:36  SpO2 93 % 08/23/23 13:36  Vitals shown include unfiled device data.  Last Pain:  Vitals:   08/23/23 1333  TempSrc: Oral  PainSc:          Complications: No notable events documented.

## 2023-08-23 NOTE — Clinical Social Work Maternal (Signed)
 CLINICAL SOCIAL WORK MATERNAL/CHILD NOTE  Patient Details  Name: Meagan Hunter MRN: [id number] Date of Birth: 11-Nov-1990  Date:  08/23/2023  Clinical Social Worker Initiating Note:  Sharyne Roulette, LCSWA Date/Time: Initiated:  08/23/23/1301     Child's Name:  First name: undecided, Last name: Meagan Hunter   Biological Parents:  Mother, Father (FOB: Meagan Hunter, DOB: 06/18/1989)   Need for Interpreter:  None   Reason for Referral:  Behavioral Health Concerns   Address:  [address] [place] [address]    Phone number:  [phone] (home)     Additional phone number:   Household Members/Support Persons (HM/SP):   Household Member/Support Person 1, Household Member/Support Person 2, Household Member/Support Person 3, Household Member/Support Person 4   HM/SP Name Relationship DOB or Age  HM/SP -1 Meagan Hunter FOB 06/18/1989  HM/SP -2 Meagan Hunter 12/13/2020  HM/SP -3 Meagan Hunter Daughter 01/14/2011  HM/SP -4 Lamar Meagan Hunter Son 08/08/2014  HM/SP -5        HM/SP -6        HM/SP -7        HM/SP -8          Natural Supports (not living in the home):  Friends, Immediate Family   Professional Supports: None   Employment: Homemaker   Type of Work:     Education:  Some Materials engineer arranged:    Surveyor, quantity Resources:  Medicaid   Other Resources:      Cultural/Religious Considerations Which May Impact Care:    Strengths:  Ability to meet basic needs  , Home prepared for child  , Pediatrician chosen   Psychotropic Medications:         Pediatrician:    [facility] area  Pediatrician List:   [facility]  [place]    [place]    [place] [facility]      Pediatrician Fax Number:    Risk Factors/Current Problems:  Mental Health Concerns     Cognitive State:  Linear Thinking  , Able to Concentrate  , Alert  , Goal Oriented     Mood/Affect:  Calm  , Comfortable  , Interested  ,  Euthymic     CSW Assessment: CSW received consult for hx of  postpartum depression, anxiety, depression, and Bipolar Disorder. CSW met with MOB to offer support and complete assessment. When CSW entered room, MOB was observed sitting in hospital bed holding infant. FOB was present sitting nearby. CSW introduced self. MOB provided verbal consent to speak in front of FOB about anything. CSW explained reason for consult. MOB presented as calm and remained engaged throughout consult.  MOB confirmed demographic information listed in chart. CSW inquired how MOB is feeling emotionally since infant's arrival. MOB reports feeling good. CSW inquired about MOB's mental health history. MOB acknowledged past diagnoses of Bipolar Disorder, anxiety, and depression, reporting she was initially diagnosed at age 47. MOB reports that since her initial diagnoses, her prior psychiatrist and MOB both agree that her mental health symptoms are more related to PMDD and correlate with her menstrual cycles but shared that she has not  yet had an official diagnosis change. MOB denied recent symptoms of hypermania, mania, or depression. CSW inquired about MOB's mood during pregnancy. MOB reports her mental health feeling manageable, reporting a normal range of emotions. MOB reports she is not currently taking mental health medications or seen by a therapist. MOB noted  that support from FOB is helpful to manage feelings of depression or anxiety when they arise. CSW inquired about a history of postpartum depression/anxiety. MOB reports she endorsed symptoms of postpartum depression after the birth of her 3 other children, marked by feeling anxiety around everything being perfect for her kids and general depression. MOB shared her symptoms have been largely tied to lack of sleep. MOB reports that her symptoms have lasted about 1 year into the postpartum period and improved. CSW inquired about MOB's interest in outpatient mental health  resources, MOB declined. CSW inquired about additional supports.MOB identified FOB's parents and her best friend as supports. CSW assessed for safety. MOB denied current SI/HI.  CSW provided education regarding the baby blues period vs. perinatal mood disorders, discussed treatment and gave resources for mental health follow up if concerns arise. CSW recommends self-evaluation during the postpartum time period using the New Mom Checklist from Postpartum Progress and encouraged MOB to contact a medical professional if symptoms are noted at any time.    MOB reports she has all needed items for infant, including a car seat and bassinet. MOB states she plans to initially follow up with [facility] but is considering switching pediatricians due to recent move to [facility]. CSW provided MOB with a pediatrician list for [facility], per [employer] request.  CSW provided review of Sudden Infant Death Syndrome (SIDS) precautions.   CSW identifies no further need for intervention and no barriers to discharge at this time.   CSW Plan/Description:  Sudden Infant Death Syndrome (SIDS) Education, No Further Intervention Required/No Barriers to Discharge, Other Information/Referral to [facility], Perinatal Mood and Anxiety Disorder (PMADs) Education    Sharyne MARLA Roulette, LCSWA 08/23/2023, 1:05 PM

## 2023-08-23 NOTE — Lactation Note (Addendum)
 This note was copied from a baby's chart. Lactation Consultation Note  Patient Name: Meagan Hunter Date: 08/23/2023 Age:33 hours Reason for consult: Follow-up assessment;Term  P4, 39 wks, @ 22 hrs of life. Per mom, does intend to stay overnight post tubal. Mom bringing infant to breast with LC arrival. Mom has concerns about latch and nipple soreness. Per mom used a shield with one of her previous babies. Explored if ever previous concerns with supply, could be related to shield use. Discussed ways to work on big mouth latching, getting lips to flange out. Adapted positioning and demonstrated latching.  Discussed/provided coconut oil/ moms own balm/ or EBM to nipple post feeding. Hydrogels provided, encouraged use alone, alternate with oil /etc to see which preferred. Shells also provided as a strategy to keep material off breasts if sore. Discussed nipple care and big mouth latching as first preventatives. Mom questions DEBP set up, discussed possible reasons to pump- mom chooses not to set up @ this time- encouraged mom she can call @ anytime DEBP is desired. Discussed expectations @ breast- Day 1- sleepy/ feed every 3 hrs/ even 10 minutes is okay, Day 2 more awake/ feeding cues/longer feeds, and cluster feeding overnights brings milk in. Highlighted breast stimulation is tied directly to milk production. Discussed hands on breast and baby, keeping baby awake @ breast. Starting with hand expression & breast compression to get baby working @ breast, and gentle stimulation to keep baby working @ breast. Encouraged EBM for nipple care post feed. LC services and milk storage shared. Encouraged mom to call for assist anytime desired.  Maternal Data Has patient been taught Hand Expression?: Yes Does the patient have breastfeeding experience prior to this delivery?: Yes  Feeding Mother's Current Feeding Choice: Breast Milk  LATCH Score Latch: Grasps breast easily, tongue down, lips flanged,  rhythmical sucking.  Audible Swallowing: Spontaneous and intermittent  Type of Nipple: Everted at rest and after stimulation  Comfort (Breast/Nipple): Soft / non-tender  Hold (Positioning): Assistance needed to correctly position infant at breast and maintain latch.  LATCH Score: 9   Lactation Tools Discussed/Used Tools: Coconut oil;Comfort gels;Shells  Interventions Interventions: Breast feeding basics reviewed;Assisted with latch;Hand express;Breast compression;Adjust position;Expressed milk;Coconut oil;Shells;Comfort gels;Education;LC Services brochure;CDC milk storage guidelines  Discharge Pump: DEBP;Personal  Consult Status Date: 08/24/23 Follow-up type: In-patient    Rehan Holness 08/23/2023, 4:36 PM

## 2023-08-23 NOTE — Progress Notes (Signed)
 Date of Initial H&P: 08/22/23  History reviewed, patient examined, no change in status, stable for surgery.   Pt desires bilateral salpingectomy if possible Discussed the surgery in detail as well as anesthesia   Has epidural in place  All questions answered and permit filled out and signed  Vonn VEAR Inch, MD 08/23/2023 9:47 AM

## 2023-08-24 MED ORDER — OXYCODONE HCL 5 MG PO TABS: 5.0000 mg | ORAL_TABLET | Freq: Four times a day (QID) | ORAL | 0 refills | Status: DC | PRN

## 2023-08-24 MED ORDER — IBUPROFEN 600 MG PO TABS: 600.0000 mg | ORAL_TABLET | Freq: Four times a day (QID) | ORAL | 0 refills | Status: AC

## 2023-08-24 NOTE — Lactation Note (Signed)
 This note was copied from a baby's chart. Lactation Consultation Note  Patient Name: Meagan Hunter Date: 08/24/2023 Age:33 hours Reason for consult: Follow-up assessment;Term  P4, 39 wks, @ 38 hrs of life. Discharge anticipated today. Per mom breast feeding is going well. Mom using her own balm after feedings for nipple soreness. Mom has shield of her own- has not used. Discussed the need to use breast pump if using a shield for breast stimulation. Addressed questions on uterine cramping with breast feeding, pacifier/bottle use anticipatory guidance, and moving milk regularly from breast- baby vs baby and pump.  Encouraged mom to keep working on big mouth latch with baby and use EBM or coconut oil after each feed. Discussed cluster feeding overnight/ early morning brings in our milk supply, shared expectations of milk coming in. Highlighted risk of engorgement. Discussed hand pump/express to soften breasts, motrin  as anti-inflammatory, and ice packs for 10-20 minutes post feed/pumping if still over-full is the best treatments for inflamed/engorged breasts. Hand pump highlighted as best tool for softening breast- provided to mom with flanges- discussed dynamic sizing with breast changes. Re-enforced LC resources and milk storage.  Maternal Data Has patient been taught Hand Expression?: Yes Does the patient have breastfeeding experience prior to this delivery?: Yes  Feeding Mother's Current Feeding Choice: Breast Milk   Interventions Interventions: Breast feeding basics reviewed;Hand express;Breast compression;Expressed milk;Coconut oil;Hand pump;Education;LC Services brochure;CDC milk storage guidelines  Discharge Discharge Education: Engorgement and breast care Pump: DEBP;Manual;Personal  Consult Status Consult Status: Complete Date: 08/24/23 Follow-up type: In-patient    [facility] 08/24/2023, 9:04 AM

## 2023-08-24 NOTE — Anesthesia Postprocedure Evaluation (Signed)
 Anesthesia Post Note  Patient: Meagan Hunter  Procedure(s) Performed: LIGATION, FALLOPIAN TUBE, POSTPARTUM (Bilateral)     Patient location during evaluation: PACU Anesthesia Type: Epidural Level of consciousness: awake and alert Pain management: pain level controlled Vital Signs Assessment: post-procedure vital signs reviewed and stable Respiratory status: spontaneous breathing Cardiovascular status: stable Anesthetic complications: no   No notable events documented.  Last Vitals:  Vitals:   08/23/23 1627 08/23/23 2026  BP: 96/68 127/80  Pulse: 77 87  Resp: 16 17  Temp: 36.7 C 37 C  SpO2: 96%     Last Pain:  Vitals:   08/24/23 0133  TempSrc:   PainSc: 4                  Norleen Pope

## 2023-08-26 ENCOUNTER — Telehealth (HOSPITAL_BASED_OUTPATIENT_CLINIC_OR_DEPARTMENT_OTHER): Payer: Self-pay | Admitting: *Deleted

## 2023-08-26 LAB — SURGICAL PATHOLOGY

## 2023-08-26 NOTE — H&P (Signed)
 LABOR AND DELIVERY ADMISSION HISTORY AND PHYSICAL NOTE   Meagan Hunter is a 33 y.o. female [phone] with IUP at [redacted]w[redacted]d presenting for IOL for SLE/mixed connective tissue disease.    Patient reports the fetal movement as active. Patient reports uterine contraction activity as irregular. Patient reports vaginal bleeding as none. Patient describes fluid per vagina as None.    Patient denies headache, vision changes, chest pain, shortness of breath, right upper quadrant pain, or LE edema.   She plans on breast feeding. Her contraception plan is: bilateral tubal ligation?.   Prenatal History/Complications: PNC at [facility]   Sono:  @[redacted]w[redacted]d , CWD, normal anatomy, cephalic presentation, posterior placenta, 26%ile   Pregnancy complications:      Patient Active Problem List    Diagnosis Date Noted   [redacted] weeks gestation of pregnancy 08/22/2023   Systemic lupus erythematosus (HCC) 05/01/2023   Supervision of high risk pregnancy, antepartum 01/09/2023   Generalized anxiety disorder 02/28/2022   ADHD (attention deficit hyperactivity disorder), 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 06/04/2020   Bipolar disorder, current episode mixed, moderate (HCC) 06/20/2015      Past Medical History:     Past Medical History:  Diagnosis Date   Anxiety     Asthma     Bipolar 1 disorder, depressed (HCC) 02/01/2016   Chronic pain syndrome 06/20/2015    Formatting of this note might be different from the original. 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          Past Surgical History:      Past Surgical History:  Procedure Laterality Date   CYST EXCISION        EYE   ESOPHAGOGASTRODUODENOSCOPY              Obstetrical History: OB History       Gravida  5   Para  3   Term  3   Preterm  0   AB  1   Living  3        SAB  1   IAB  0   Ectopic  0   Multiple  0   Live Births  3               Social History: Social History         Socioeconomic History   Marital status: Media planner      Spouse name: Not on file   Number of children: 3   Years of education: Not on file   Highest education level: 12th grade  Occupational History   Occupation: stay at home mom  Tobacco Use   Smoking status: Former      Current packs/day: 0.00      Types: Cigarettes      Quit date: 02/26/2015      Years since quitting: 8.4   Smokeless tobacco: Never  Vaping Use   Vaping status: Never Used  Substance and Sexual Activity   Alcohol use: Not  Currently      Alcohol/week: 2.0 standard drinks of alcohol      Types: 2 Standard drinks or equivalent per week      Comment: 3 per week   Drug use: Never   Sexual activity: Not Currently      Birth control/protection: None  Other Topics Concern   Not on file  Social History Narrative   Not on file    Social Drivers of Health        Financial Resource Strain: Low Risk  (02/26/2023)    Overall Financial Resource Strain (CARDIA)     Difficulty of Paying Living Expenses: Not very hard  Food Insecurity: No Food Insecurity (02/26/2023)    Hunger Vital Sign     Worried About Running Out of Food in the Last Year: Never true     Ran Out of Food in the Last Year: Never true  Transportation Needs: No Transportation Needs (02/26/2023)    PRAPARE - Therapist, art (Medical): No     Lack of Transportation (Non-Medical): No  Physical Activity: Insufficiently Active (02/26/2023)    Exercise Vital Sign     Days of Exercise  per Week: 3 days     Minutes of Exercise per Session: 10 min  Stress: No Stress Concern Present (02/26/2023)    [employer] of Occupational Health - Occupational Stress Questionnaire     Feeling of Stress : Not at all  Social Connections: Moderately Isolated (02/26/2023)    Social Connection and Isolation Panel     Frequency of Communication with Friends and Family: More than three times a week     Frequency of Social Gatherings with Friends and Family: Once a week     Attends Religious Services: Never     Database administrator or Organizations: No     Attends Engineer, structural: Never     Marital Status: Living with partner      Family History:      Family History  Problem Relation Age of Onset   Hypertension Mother     Ovarian cancer Mother     Thyroid cancer Mother     Crohn's disease Mother     Hypertension Father     Hyperlipidemia Father     Heart failure Father     Hashimoto's thyroiditis Sister     Breast cancer Maternal Aunt     Heart failure Maternal Grandfather     Heart failure Paternal Grandfather     Colon cancer Paternal Grandfather            Allergies: Allergies       Allergies  Allergen Reactions   Penicillins Hives, Itching, Nausea And Vomiting and Other (See Comments)      Other reaction(s): Fever Fever, rash and vomiting Fever, rash and vomiting Other reaction(s): Fever Fever, rash and vomiting   Zoloft [Sertraline] Other (See Comments)      seizures Other reaction(s): Seizures seizures               Medications Prior to Admission  Medication Sig Dispense Refill Last Dose/Taking   omeprazole  (PRILOSEC) 20 MG capsule Take 1 capsule (20 mg total) by mouth daily. 90 capsule 3     ondansetron  (ZOFRAN -ODT) 8 MG disintegrating tablet Take 1 tablet (8 mg total) by mouth every 8 (eight) hours as needed for nausea or vomiting. 10 tablet 0     Prenatal Vit-Fe Fumarate-FA (MULTIVITAMIN-PRENATAL) 27-0.8 MG TABS tablet  Take 1 tablet by  mouth daily at 12 noon.         promethazine  (PHENERGAN ) 25 MG tablet Take 1 tablet (25 mg total) by mouth every 6 (six) hours as needed for nausea or vomiting. 30 tablet 5     VENTOLIN  HFA 108 (90 Base) MCG/ACT inhaler Inhale 2 puffs into the lungs every 6 (six) hours as needed. 6.7 g 3              Review of Systems  All systems reviewed and negative except as stated in HPI   Physical Exam BP 139/87   Pulse (!) 122   Temp 98 F (36.7 C) (Oral)   Resp 18   Ht 5' 4 (1.626 m)   Wt 80.7 kg   LMP 11/21/2022   BMI 30.55 kg/m    Physical Exam  Physical Exam Constitutional:      General: She is not in acute distress.    Appearance: She is not ill-appearing.   Cardiovascular:     Rate and Rhythm: Normal rate and regular rhythm.  Pulmonary:     Effort: Pulmonary effort is normal.  Abdominal:     Comments: Gravid  Genitourinary:    General: Normal vulva.   Musculoskeletal:        General: No swelling.   Skin:    General: Skin is warm and dry.   Neurological:     General: No focal deficit present.   Psychiatric:        Mood and Affect: Mood normal.     Presentation: cephalic by check   Fetal monitoring: Baseline: 140 bpm, Variability: Good {> 6 bpm), Accelerations: Reactive, and Decelerations: Absent Uterine activity: Irregular   Dilation: 4 Effacement (%): 70, 80 Cervical Position: Middle Station: -1 Presentation: Vertex Exam by:: Lima, rn   Prenatal labs: ABO, Rh: --/--/PENDING (06/27 1226) Antibody: PENDING (06/27 1226) Rubella: 8.70 (12/11 1135) RPR: Non Reactive (04/03 0829)  HBsAg: Negative (12/11 1135)  HIV: Non Reactive (04/03 0829)  GC/Chlamydia:  Last Labs       Neisseria Gonorrhea  Date Value Ref Range Status  07/31/2023 Negative   Final         Chlamydia  Date Value Ref Range Status  07/31/2023 Negative   Final      GBS: Negative/-- (06/05 0444)    Prenatal Transfer Tool  Maternal Diabetes: No Genetic Screening:  Normal Maternal Ultrasounds/Referrals: Normal Fetal Ultrasounds or other Referrals:  None Maternal Substance Abuse:  No Significant Maternal Medications:  None Significant Maternal Lab Results: Group B Strep negative         Results for orders placed or performed during the hospital encounter of 08/22/23 (from the past 24 hours)  Type and screen [facility]    Collection Time: 08/22/23 12:26 PM  Result Value Ref Range    ABO/RH(D) PENDING      Antibody Screen PENDING      Sample Expiration          08/25/2023,2359 Performed at [facility], [address] [place], [place] [address]    CBC    Collection Time: 08/22/23 12:27 PM  Result Value Ref Range    WBC 9.3 4.0 - 10.5 K/uL    RBC 3.87 3.87 - 5.11 MIL/uL    Hemoglobin 12.5 12.0 - 15.0 g/dL    HCT 63.3 63.9 - 53.9 %    MCV 94.6 80.0 - 100.0 fL    MCH 32.3 26.0 - 34.0  pg    MCHC 34.2 30.0 - 36.0 g/dL    RDW 87.3 88.4 - 84.4 %    Platelets 150 150 - 400 K/uL    nRBC 0.0 0.0 - 0.2 %      Assessment: Meagan Hunter is a 33 y.o. [id number] at [redacted]w[redacted]d here for IOL 2/2 SLE/mixed connective tissue diease   #Labor: Favorable cervix. Prefers to start with pitocin , followed by AROM after epidural #Pain:IV pain meds PRN, epidural upon request #FHT:  Category I #GBS/ID: Negative #MOF: breast feeding #MOC: considering PP BTL. Has papers signed 05/29/23  #Circ: No   #SLE: On no medications currently and without flare. Has appointment scheduled with rheum PP #Bipolar/GAD: Early PP depression/anxiety screen. Not on any current medications   Almarie CHRISTELLA Moats, MD [facility] Fellow Center for [facility], [facility]   08/22/2023, 12:56 PM

## 2023-08-26 NOTE — Telephone Encounter (Signed)
 TC from patient in VM.  Pt delivered 6/27 and had tubal on 6/28.  Pt is breastfeeding.  Experiencing postpartum anxiety.  Was on medication prior to pregnancy, Klonopin .  Which Is not ypically this safe in breastfeeding. Advised needs virtual visit to discuss. Scheduled for 7/3 915am. KD CMA

## 2023-08-28 ENCOUNTER — Encounter (HOSPITAL_BASED_OUTPATIENT_CLINIC_OR_DEPARTMENT_OTHER): Payer: Medicaid Other | Admitting: Obstetrics & Gynecology

## 2023-08-28 ENCOUNTER — Telehealth (HOSPITAL_BASED_OUTPATIENT_CLINIC_OR_DEPARTMENT_OTHER): Admitting: Certified Nurse Midwife

## 2023-08-28 DIAGNOSIS — F418 Other specified anxiety disorders: Secondary | ICD-10-CM

## 2023-08-28 MED ORDER — LORAZEPAM 0.5 MG PO TABS: 0.2500 mg | ORAL_TABLET | Freq: Three times a day (TID) | ORAL | 0 refills | Status: DC

## 2023-08-28 NOTE — Progress Notes (Signed)
 Virtual Visit via Telephone Note  I connected with Nat Starcher on 08/28/23 at  9:15 AM EDT by telephone and verified that I am speaking with the correct person using two identifiers.  Location: Patient: Home Provider: Home   I discussed the limitations, risks, security and privacy concerns of performing an evaluation and management service by telephone and the availability of in person appointments. I also discussed with the patient that there may be a patient responsible charge related to this service. The patient expressed understanding and agreed to proceed.   History of Present Illness:  Sydnie Sigmund is a 33 y.o. female postpartum, breastfeeding. Patient is postpartum and reports My anxiety is coming back. She is home with 4 children. Denies depression. Reports she has tried Wellbutrin, SSRI in the past but none of them worked efficiently to help with her anxiety. Pt desires a prescription for Ativan or Klonopin  if possible.    Observations/Objective: Alert and Oriented. No distress noted.  Assessment and Plan: Postpartum Anxiety Breastfeeding  Follow Up Instructions: Pt verbalizes understanding of potential risks to her/baby with Ativan. Pt feels strongly that Ativan is the only medication that may work to alleviate her anxiety. She is aware of availability of counseling. She will take .25mg  Ativan sparingly.  I discussed the assessment and treatment plan with the patient. The patient was provided an opportunity to ask questions and all were answered. The patient agreed with the plan and demonstrated an understanding of the instructions.   The patient was advised to call back or seek an in-person evaluation if the symptoms worsen or if the condition fails to improve as anticipated.  I provided 10 minutes of non-face-to-face time during this encounter.   Arland MARLA Roller, CNM

## 2023-09-25 ENCOUNTER — Encounter (HOSPITAL_BASED_OUTPATIENT_CLINIC_OR_DEPARTMENT_OTHER): Payer: Self-pay

## 2023-10-15 ENCOUNTER — Encounter: Admitting: Internal Medicine

## 2023-10-20 ENCOUNTER — Ambulatory Visit (HOSPITAL_BASED_OUTPATIENT_CLINIC_OR_DEPARTMENT_OTHER): Admitting: Certified Nurse Midwife

## 2023-10-20 ENCOUNTER — Encounter (HOSPITAL_BASED_OUTPATIENT_CLINIC_OR_DEPARTMENT_OTHER): Payer: Self-pay | Admitting: Certified Nurse Midwife

## 2023-10-20 ENCOUNTER — Other Ambulatory Visit (HOSPITAL_COMMUNITY)
Admission: RE | Admit: 2023-10-20 | Discharge: 2023-10-20 | Disposition: A | Discharge status: Home / Self Care | Source: Ambulatory Visit | Attending: Certified Nurse Midwife | Admitting: Certified Nurse Midwife

## 2023-10-20 DIAGNOSIS — Z124 Encounter for screening for malignant neoplasm of cervix: Secondary | ICD-10-CM | POA: Diagnosis present

## 2023-10-20 DIAGNOSIS — Z1332 Encounter for screening for maternal depression: Secondary | ICD-10-CM

## 2023-10-20 NOTE — Progress Notes (Signed)
 Subjective:     Meagan Hunter is a 33 y.o. female who presents for a postpartum visit. She is 6 weeks postpartum following a spontaneous vaginal delivery. I have fully reviewed the prenatal and intrapartum course. The delivery was at 39 gestational weeks. Outcome: spontaneous vaginal delivery. Anesthesia: epidural. Postpartum course has been uneventful. Baby's course has been uneventful. Baby is feeding by breast. Bleeding no bleeding. Bowel function is normal. Bladder function is normal. Patient will be  sexually active. Contraception method is bilateral salpingectomy. Postpartum depression screening: negative.  The following portions of the patient's history were reviewed and updated as appropriate: allergies, current medications, past family history, past medical history, past social history, past surgical history, and problem list.  Review of Systems Pertinent items are noted in HPI.   Objective:    BP 128/76   Pulse 66   Ht 5' 4 (1.626 m)   Wt 170 lb (77.1 kg)   LMP 11/21/2022   Breastfeeding Yes   BMI 29.18 kg/m   General:  alert, cooperative, and appears stated age   Breasts:  lactating  Lungs:   Heart:    Abdomen: soft, non-tender; bowel sounds normal; no masses,  no organomegaly   Vulva:  normal  Vagina: normal vagina, no discharge, exudate, lesion, or erythema  Cervix:  multiparous appearance  Corpus: normal size, contour, position, consistency, mobility, non-tender  Adnexa:  no mass, fullness, tenderness           Assessment:    [id number] here for routine  postpartum exam. Pap smear done at today's visit.   Plan:    1. Contraception: Salpingectomy (Bilateral) 2. Continue prenatal vitamin while breastfeeding 3. Follow up in: 1 year or as needed.   Arland MARLA Roller

## 2023-10-23 LAB — CYTOLOGY - PAP
Comment: NEGATIVE
Diagnosis: UNDETERMINED — AB
High risk HPV: NEGATIVE

## 2023-10-24 ENCOUNTER — Ambulatory Visit (HOSPITAL_BASED_OUTPATIENT_CLINIC_OR_DEPARTMENT_OTHER): Payer: Self-pay | Admitting: Certified Nurse Midwife

## 2023-11-12 ENCOUNTER — Other Ambulatory Visit: Payer: Self-pay

## 2023-11-12 ENCOUNTER — Ambulatory Visit
Admission: EM | Admit: 2023-11-12 | Discharge: 2023-11-12 | Disposition: A | Discharge status: Home / Self Care | Attending: Family Medicine | Admitting: Family Medicine

## 2023-11-12 DIAGNOSIS — J029 Acute pharyngitis, unspecified: Secondary | ICD-10-CM | POA: Diagnosis present

## 2023-11-12 DIAGNOSIS — R051 Acute cough: Secondary | ICD-10-CM | POA: Insufficient documentation

## 2023-11-12 LAB — POC SOFIA SARS ANTIGEN FIA: SARS Coronavirus 2 Ag: NEGATIVE

## 2023-11-12 LAB — POCT RAPID STREP A (OFFICE): Rapid Strep A Screen: NEGATIVE

## 2023-11-12 MED ORDER — AZITHROMYCIN 500 MG PO TABS: 500.0000 mg | ORAL_TABLET | Freq: Every day | ORAL | 0 refills | Status: AC

## 2023-11-12 NOTE — Discharge Instructions (Signed)
 Test results will be released to your MyChart account We will contact you if anything is positive and requires treatment.

## 2023-11-12 NOTE — ED Provider Notes (Signed)
 [facility]    CSN: [id number] Arrival date & time: 11/12/23  9187      History   Chief Complaint Chief Complaint  Patient presents with   Sore Throat   Headache   Generalized Body Aches    HPI Meagan Hunter is a 33 y.o. female.    Sore Throat Associated symptoms include headaches. Pertinent negatives include no abdominal pain and no shortness of breath.  Headache Associated symptoms: cough, diarrhea, fatigue and vomiting   Associated symptoms: no abdominal pain and no nausea   Not feeling well for 2 days symptoms include sore throat, body aches, rhinorrhea, nasal congestion, fatigue, cough, vomiting, diarrhea.  Several of her children are sick, 1 had a strep culture that was positive.  Denies confirmed COVID or flu exposures.  Denies fever, abdominal pain, chest pain, shortness of breath.  States has a history of gastroparesis and will often vomit when she is ill.  She is currently breast-feeding an infant.  Allergy to penicillin and Zoloft  Past Medical History:  Diagnosis Date   Anxiety    Asthma    Bipolar 1 disorder, depressed (HCC) 02/01/2016   Chronic pain syndrome 06/20/2015   Formatting of this note might be different from the original. 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 06/04/2020   Bipolar disorder, current episode mixed, moderate (HCC) 06/20/2015    Past Surgical History:  Procedure Laterality Date   CYST EXCISION     EYE   ESOPHAGOGASTRODUODENOSCOPY     TUBAL LIGATION Bilateral 08/23/2023   Procedure: LIGATION, FALLOPIAN TUBE, POSTPARTUM;  Surgeon: Jayne Vonn DEL, MD;  Location: [facility];  Service: Gynecology;  Laterality: Bilateral;    OB History     Gravida  5   Para  4   Term  4   Preterm  0   AB  1   Living  4      SAB  1   IAB  0   Ectopic  0   Multiple  0   Live Births  4            Home Medications    Prior to Admission medications   Medication Sig Start Date End Date Taking? Authorizing Provider  azithromycin  (ZITHROMAX ) 500 MG tablet Take 1 tablet (500 mg total) by mouth daily for 5 days. 11/12/23 11/17/23 Yes Mechelle Pates, PA  omeprazole  (PRILOSEC) 20 MG capsule Take 20 mg by mouth daily.   Yes [provider]  ibuprofen  (ADVIL ) 600 MG tablet Take 1 tablet (600 mg total) by mouth every 6 (six) hours. Patient not taking: Reported on  10/20/2023 08/24/23   Jayne Vonn DEL, MD  ondansetron  (ZOFRAN -ODT) 4 MG disintegrating tablet Take 4 mg by mouth every 6 (six) hours as needed. 10/05/23   [provider]  oxyCODONE  (OXY IR/ROXICODONE ) 5 MG immediate release tablet Take 1 tablet (5 mg total) by mouth every 6 (six) hours as needed for severe pain (pain score 7-10) or breakthrough pain. Patient not taking: Reported on 10/20/2023 08/24/23   Jayne Vonn DEL, MD  Prenatal Vit-Fe Fumarate-FA (MULTIVITAMIN-PRENATAL) 27-0.8 MG TABS tablet Take 1 tablet by mouth daily at 12 noon.    [provider]  VENTOLIN  HFA 108 (90 Base) MCG/ACT inhaler Inhale 2 puffs into the lungs every  6 (six) hours as needed. 04/14/23   Lo, Arland POUR, CNM    Family History Family History  Problem Relation Age of Onset   Hypertension Mother    Ovarian cancer Mother    Thyroid cancer Mother    Crohn's disease Mother    Hypertension Father    Hyperlipidemia Father    Heart failure Father    Hashimoto's thyroiditis Sister    Breast cancer Maternal Aunt    Heart failure Maternal Grandfather    Heart failure Paternal Grandfather    Colon cancer Paternal Grandfather     Social History Social History   Tobacco Use   Smoking status: Former    Current packs/day: 0.00    Types: Cigarettes    Quit date: 02/26/2015    Years since quitting: 8.7   Smokeless tobacco: Never  Vaping Use   Vaping status: Never Used  Substance Use Topics   Alcohol use: Not Currently    Alcohol/week: 2.0 standard drinks of alcohol    Types: 2 Standard drinks or equivalent per week    Comment: 3 per week   Drug use: Never     Allergies   Penicillins and Zoloft [sertraline]   Review of Systems Review of Systems  Constitutional:  Positive for fatigue. Negative for appetite change and diaphoresis.  Respiratory:  Positive for cough. Negative for shortness of breath.   Gastrointestinal:  Positive for diarrhea and vomiting. Negative for abdominal pain and nausea.  Neurological:  Positive for headaches.     Physical Exam Triage Vital Signs ED Triage Vitals  Encounter Vitals Group     BP 11/12/23 0824 117/81     Girls Systolic BP Percentile --      Girls Diastolic BP Percentile --      Boys Systolic BP Percentile --      Boys Diastolic BP Percentile --      Pulse Rate 11/12/23 0824 82     Resp 11/12/23 0824 20     Temp 11/12/23 0824 98 F (36.7 C)     Temp Source 11/12/23 0824 Oral     SpO2 11/12/23 0824 97 %     Weight --      Height --      Head Circumference --      Peak Flow --      Pain Score 11/12/23 0821 6     Pain Loc --      Pain Education --      Exclude from Growth Chart --     No data found.  Updated Vital Signs BP 117/81 (BP Location: Right Arm)   Pulse 82   Temp 98 F (36.7 C) (Oral)   Resp 20   LMP 11/21/2022   SpO2 97%   Breastfeeding Yes   Visual Acuity Right Eye Distance:  Left Eye Distance:   Bilateral Distance:    Right Eye Near:   Left Eye Near:    Bilateral Near:     Physical Exam Vitals and nursing note reviewed.  Constitutional:      Appearance: She is not ill-appearing.  HENT:     Head: Normocephalic.     Right Ear: Tympanic membrane and ear canal normal.     Left Ear: Tympanic membrane and ear canal normal.     Mouth/Throat:     Mouth: Mucous membranes are moist.     Pharynx: Oropharynx is clear. Uvula midline. No pharyngeal swelling, oropharyngeal exudate, posterior oropharyngeal erythema or uvula swelling.     Tonsils: No tonsillar exudate or tonsillar abscesses.  Eyes:     Conjunctiva/sclera: Conjunctivae normal.  Cardiovascular:     Rate and Rhythm: Normal rate and regular rhythm.     Heart sounds: Normal heart sounds. No murmur heard. Pulmonary:     Effort: Pulmonary effort is normal. No respiratory distress.     Breath sounds: Normal breath sounds. No wheezing or rales.  Musculoskeletal:     Cervical back: Neck supple.  Lymphadenopathy:     Cervical: No cervical adenopathy.  Neurological:     Mental Status: She is alert.      UC Treatments / Results  Labs (all labs ordered are listed, but only abnormal results are displayed) Labs Reviewed  CULTURE, GROUP A STREP [facility])  POCT RAPID STREP A (OFFICE)  POC SOFIA SARS ANTIGEN FIA    EKG   Radiology No results found.  Procedures Procedures (including critical care time)  Medications Ordered in UC Medications - No data to display  Initial Impression / Assessment and Plan / UC Course  I have reviewed the triage vital signs and the nursing notes.  Pertinent labs & imaging results that were available during my care of the patient were reviewed by me  and considered in my medical decision making (see chart for details).     33 year old female currently breast-feeding presents with sore throat, body aches, rhinorrhea, nasal congestion, cough, vomiting, diarrhea, multiple household contacts are on antibiotics, one of her children tested positive for strep.  Denies confirmed COVID or flu exposures.  Vital signs are normal, no significant abnormalities on exam, point-of-care strep is negative, point-of-care COVID is negative, will send strep culture based on exposure.  Patient given a paper prescription for azithromycin  which she will fill if her strep culture comes back positive.  OTC meds for symptomatic relief Final Clinical Impressions(s) / UC Diagnoses   Final diagnoses:  Sore throat  Acute cough     Discharge Instructions      Test results will be released to your MyChart account We will contact you if anything is positive and requires treatment.        ED Prescriptions     Medication Sig Dispense Auth. Provider   azithromycin  (ZITHROMAX ) 500 MG tablet Take 1 tablet (500 mg total) by mouth daily for 5 days. 5 tablet Kiyoko Mcguirt, [place]      PDMP not reviewed this encounter.   [place]ford [place], [place] 11/12/23 [phone]

## 2023-11-12 NOTE — ED Triage Notes (Signed)
 Pt states she has not felt well for about 2 days. Pt states she has had body aches, sore throat, and a head ache. Pt denies any fevers or chills. Pts taken Tylenol  over night for pain with little relief. Recently pts little one was sick and seen here. Pt states her sons strep swab came back positive.

## 2023-11-15 LAB — CULTURE, GROUP A STREP (THRC)

## 2023-11-17 ENCOUNTER — Ambulatory Visit (HOSPITAL_COMMUNITY): Payer: Self-pay

## 2023-12-05 ENCOUNTER — Ambulatory Visit (INDEPENDENT_AMBULATORY_CARE_PROVIDER_SITE_OTHER): Admitting: Certified Nurse Midwife

## 2023-12-05 ENCOUNTER — Encounter (HOSPITAL_BASED_OUTPATIENT_CLINIC_OR_DEPARTMENT_OTHER): Payer: Self-pay | Admitting: Certified Nurse Midwife

## 2023-12-05 VITALS — BP 129/80 | HR 75 | Wt 170.6 lb

## 2023-12-05 DIAGNOSIS — M329 Systemic lupus erythematosus, unspecified: Secondary | ICD-10-CM

## 2023-12-05 NOTE — Progress Notes (Signed)
 Pt reports that increase neuropathy since birth. She reports sometimes she is having trouble walking and reports that its now in bilateral legs and arms as well. She reports consistent headaches as well.   Meagan Hunter is a 33yo [id number] who is 3 months postpartum Term SVD [place] on 08/22/23. Pt concerned that she is experiencing issues with all extremities which sometimes include numbness and weakness. Pt does have a History of being diagnosed with Systemic Lupus Erythematosus.  Subjective:     Meagan Hunter is a 33 y.o. female [id number] at approx 3 months postpartum Term SVD [facility] on 08/22/23. Medical Hx significant for Systemic Lupus Erythematosus. Pt having issues with headaches and some numbness/tingling/weakness in all extremities. Pt concerned that this could have resulted from epidural anesthesia at delivery.    The following portions of the patient's history were reviewed and updated as appropriate: allergies, current medications, past family history, past medical history, past social history, past surgical history, and problem list.   Review of Systems Pertinent items are noted in HPI.    Objective:    BP 129/80 (BP Location: Right Arm, Patient Position: Sitting, Cuff Size: Large)   Pulse 75   Wt 170 lb 9.6 oz (77.4 kg)   SpO2 100%   Breastfeeding Yes   BMI 29.28 kg/m  General appearance: alert, cooperative, appears stated age, and no distress    Assessment:    Systemic Lupus Erythematosus.    Plan:    CNM recommended that patient call Primary Care Provider and start evaluation of symptoms (appt scheduled for 12/10/23). Pt has New Rheumatology appointment scheduled 02/2024.  Meagan Hunter

## 2023-12-10 ENCOUNTER — Ambulatory Visit (INDEPENDENT_AMBULATORY_CARE_PROVIDER_SITE_OTHER): Admitting: Family

## 2023-12-10 ENCOUNTER — Encounter: Payer: Self-pay | Admitting: Family

## 2023-12-10 VITALS — BP 104/64 | Temp 97.5°F | Ht 64.0 in | Wt 170.2 lb

## 2023-12-10 DIAGNOSIS — M5412 Radiculopathy, cervical region: Secondary | ICD-10-CM | POA: Diagnosis not present

## 2023-12-10 DIAGNOSIS — F411 Generalized anxiety disorder: Secondary | ICD-10-CM

## 2023-12-10 DIAGNOSIS — F902 Attention-deficit hyperactivity disorder, combined type: Secondary | ICD-10-CM

## 2023-12-10 MED ORDER — AMPHETAMINE-DEXTROAMPHET ER 10 MG PO CP24: 10.0000 mg | ORAL_CAPSULE | ORAL | 0 refills | Status: DC

## 2023-12-10 MED ORDER — AMPHETAMINE-DEXTROAMPHETAMINE 5 MG PO TABS: 5.0000 mg | ORAL_TABLET | Freq: Every day | ORAL | 0 refills | Status: DC

## 2023-12-10 MED ORDER — CLONAZEPAM 0.5 MG PO TABS: 0.5000 mg | ORAL_TABLET | Freq: Every day | ORAL | 0 refills | Status: DC | PRN

## 2023-12-10 NOTE — Progress Notes (Signed)
 Patient ID: Meagan Hunter, female    DOB: 06/28/1990, 33 y.o.   MRN: [id number]  Chief Complaint  Patient presents with   ADHD    Pt would like to discuss restarting medications.   Anxiety   Tingling    Pt c/o Tingling and Numbness in bilateral legs and arms after giving birth, present for 3 months.   Discussed the use of AI scribe software for clinical note transcription with the patient, who gave verbal consent to proceed.  History of Present Illness Meagan Hunter is a 33 year old female who presents for follow up of her ADHD, anxiety and with worsening tingling and numbness in her limbs. She is accompanied by her 3 mo infant son, Colson.  Tingling and numbness have been present for three months, initially affecting her hips and legs, especially after prolonged sitting. Pain occurs upon standing, and it takes about thirty minutes in the morning to walk comfortably. Symptoms have recently extended to her arms, causing numbness when her arms are bent during activities such as driving or sleeping. Neck pain and discomfort have been intermittent for over ten years.  She has anxiety and ADHD, previously using Adderall, starting at ten milligrams, then increasing to fifteen and twenty milligrams of extended release. Klonopin  was used prior to her pregnancy due to anxiety over a miscarriage. Other medications such as Xanax, Ativan , buspirone, hydroxyzine , Lexapro, and Prozac  were either too sedating or ineffective for her anxiety. Wellbutrin was successful for depression but was advised against using it with Adderall.  She is a mother of 4 children, aged thirteen, nine, three, & 3 mos, and is currently staying home. She is not breastfeeding and has had her tubes tied. She does not have a counselor or therapist at present but has had therapy in the past.  Assessment & Plan Attention-deficit hyperactivity disorder (ADHD) ADHD symptoms not well controlled due to no medication r/t pregnancy. Previous  Adderall effective. Post-pregnancy hormonal changes may affect medication sensitivity. Prefers lower dose to assess tolerance. - Prescribe Adderall XR 10 mg in the morning, 30d w/2 RF on hold at pharmacy. - Prescribe Adderall 5 mg immediate release in the afternoon, after lunch, 30d with 2 RF on hold. - F/U in 3 mos in office  Generalized anxiety disorder Anxiety exacerbated by ADHD. Klonopin  0.5mg  effective but risks of addiction and dementia. Xanax and Ativan  caused sedation. Buspirone and hydroxyzine  ineffective. Wellbutrin unsuitable due to anxiety. Not in therapy, advised counseling. - Prescribe Klonopin  0.5mg  to be used only as needed, not for daily use. - Recommend therapy or counseling - Offer referral to psychiatry for medication management if needed. - F/U in 3 mos  Cervical radiculopathy Tingling and numbness in arms, possibly due to cervical radiculopathy. Possible nerve compression or cervical degeneration, other possible differential is carpal tunnel syndrome. - Refer to sports medicine for evaluation of cervical radiculopathy. - Recommend wearing wrist braces with hard under wrist plates overnight for two weeks and during day as able to improve symptoms.  Lower extremity pain and numbness post-epidural Pain and numbness in lower extremities post-epidural, worsening over three months. Symptoms may be related to epidural but not typically permanent. May improve over time.  Subjective:    Outpatient Medications Prior to Visit  Medication Sig Dispense Refill   ibuprofen  (ADVIL ) 600 MG tablet Take 1 tablet (600 mg total) by mouth every 6 (six) hours. 30 tablet 0   omeprazole  (PRILOSEC) 20 MG capsule Take 20 mg by mouth daily.  ondansetron  (ZOFRAN -ODT) 4 MG disintegrating tablet Take 4 mg by mouth every 6 (six) hours as needed.     Prenatal Vit-Fe Fumarate-FA (MULTIVITAMIN-PRENATAL) 27-0.8 MG TABS tablet Take 1 tablet by mouth daily at 12 noon.     VENTOLIN  HFA 108 (90 Base)  MCG/ACT inhaler Inhale 2 puffs into the lungs every 6 (six) hours as needed. 6.7 g 3   oxyCODONE  (OXY IR/ROXICODONE ) 5 MG immediate release tablet Take 1 tablet (5 mg total) by mouth every 6 (six) hours as needed for severe pain (pain score 7-10) or breakthrough pain. (Patient not taking: Reported on 10/20/2023) 20 tablet 0   No facility-administered medications prior to visit.   Past Medical History:  Diagnosis Date   Anxiety    Asthma    Bipolar 1 disorder, depressed (HCC) 02/01/2016   Chronic pain syndrome 06/20/2015   Formatting of this note might be different from the original. 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   Past Surgical History:  Procedure Laterality Date   CYST EXCISION     EYE   ESOPHAGOGASTRODUODENOSCOPY     TUBAL LIGATION Bilateral 08/23/2023   Procedure: LIGATION, FALLOPIAN TUBE, POSTPARTUM;  Surgeon: Jayne Vonn DEL, MD;  Location: [facility];  Service: Gynecology;  Laterality: Bilateral;   Allergies  Allergen Reactions   Penicillins Hives, Itching, Nausea And Vomiting and Other (See Comments)    Other reaction(s): Fever Fever, rash and vomiting Fever, rash and vomiting Other reaction(s): Fever Fever, rash and vomiting   Zoloft [Sertraline] Other (See Comments)    seizures Other reaction(s): Seizures seizures      Objective:    Physical Exam Vitals and nursing note reviewed.  Constitutional:      Appearance: Normal appearance.  Cardiovascular:     Rate and Rhythm: Normal rate and regular rhythm.   Pulmonary:     Effort: Pulmonary effort is normal.     Breath sounds: Normal breath sounds.  Musculoskeletal:        General: Normal range of motion.  Skin:    General: Skin is warm and dry.  Neurological:     Mental Status: She is alert.  Psychiatric:        Mood and Affect: Mood normal.        Behavior: Behavior normal.    BP 104/64 (BP Location: Left Arm, Patient Position: Sitting, Cuff Size: Normal)   Temp (!) 97.5 F (36.4 C) (Temporal)   Ht 5' 4 (1.626 m)   Wt 170 lb 4 oz (77.2 kg)   SpO2 100%   Breastfeeding No   BMI 29.22 kg/m  Wt Readings from Last 3 Encounters:  12/10/23 170 lb 4 oz (77.2 kg)  12/05/23 170 lb 9.6 oz (77.4 kg)  10/20/23 170 lb (77.1 kg)      Lucius Krabbe, NP

## 2023-12-11 ENCOUNTER — Encounter: Payer: Self-pay | Admitting: Family

## 2023-12-11 ENCOUNTER — Other Ambulatory Visit: Payer: Self-pay

## 2023-12-11 DIAGNOSIS — R110 Nausea: Secondary | ICD-10-CM

## 2023-12-11 MED ORDER — ONDANSETRON 4 MG PO TBDP: 4.0000 mg | ORAL_TABLET | Freq: Four times a day (QID) | ORAL | 1 refills | Status: DC | PRN

## 2023-12-19 ENCOUNTER — Encounter: Payer: Self-pay | Admitting: Family Medicine

## 2023-12-19 ENCOUNTER — Ambulatory Visit
Admission: RE | Admit: 2023-12-19 | Discharge: 2023-12-19 | Disposition: A | Discharge status: Home / Self Care | Source: Ambulatory Visit | Attending: Family Medicine | Admitting: Family Medicine

## 2023-12-19 ENCOUNTER — Ambulatory Visit: Admitting: Family Medicine

## 2023-12-19 VITALS — BP 131/86 | Ht 64.0 in | Wt 170.0 lb

## 2023-12-19 DIAGNOSIS — M5416 Radiculopathy, lumbar region: Secondary | ICD-10-CM | POA: Diagnosis not present

## 2023-12-19 DIAGNOSIS — R296 Repeated falls: Secondary | ICD-10-CM | POA: Diagnosis not present

## 2023-12-19 DIAGNOSIS — R29898 Other symptoms and signs involving the musculoskeletal system: Secondary | ICD-10-CM | POA: Diagnosis not present

## 2023-12-19 DIAGNOSIS — M5412 Radiculopathy, cervical region: Secondary | ICD-10-CM

## 2023-12-19 MED ORDER — PREDNISONE 20 MG PO TABS: 40.0000 mg | ORAL_TABLET | Freq: Every day | ORAL | 0 refills | Status: AC

## 2023-12-19 NOTE — Progress Notes (Addendum)
 [facility] A Department of [facility] [place]   PCP: Lucius Krabbe, NP  CHIEF COMPLAINT: Chronic neck pain with right-sided radiculopathy, low back pain since epidural for childbirth 4 months ago  HPI: Patient is a pleasant 33 y.o. female who presents today for evaluation of acute on chronic neck pain and low back pain.  Patient states she has had neck pain for over a decade.  Had CT and MRI in 2014 which showed herniation of disc at C5/C6 with foraminal stenosis bilaterally.  Had 1 ESI into her neck but this did not improve any of her symptoms.  Declined remaining to injections due to fear of needles.  Has lived with persistent neck pain for over the last decade.  Endorses persistent numbness at base of neck around C6/C7.  She also has intermittent radiculopathy into her right arm which causes entire hand to go numb intermittently, especially while performing overhead activities or driving her car.  She recently had a baby 4 months ago and taking care of a young child's has made all of her neck pain symptoms much worse.  Would like to restart evaluation process and neck pain.  Additionally, patient has had persistent low back pain with lower extremity weakness for the last 4 months ever since she had an epidural during childbirth.  States that every morning upon awakening she has weakness and numbness of her lower extremities and has to stretch for 30 minutes in order to be able to walk.  OB at 6 weeks checkup thought this would improve with time but it has been 4 months since symptoms and only getting worse.  Weakness in her lower extremity has caused her to  suddenly collapse and fall on multiple occasions.  Endorses episodes of falling down stairs secondary to weakness.  Endorses episodes of falling while out shopping at [facility] secondary to weakness.  She also endorses bilateral radicular pain in L3-L5 distribution that goes all the way into her feet.   Denies any fevers or chills.  Denies any acute progression of symptoms over the last week.  Denies any gross urinary or fecal incontinence.  Denies any perennial numbness or tingling.   PMH:  Past Medical History:  Diagnosis Date   [redacted] weeks gestation of pregnancy 08/22/2023   Anxiety    Asthma    Bipolar 1 disorder, depressed (HCC) 02/01/2016   Chronic pain syndrome 06/20/2015   Formatting of this note might be different from the original. RUE/RLE weakness, numbness tingling Generalized abdominal pain     Depression    Gastroparesis    GERD (gastroesophageal reflux disease)    Hiatal hernia    Irritable bowel syndrome 09/28/2015   Kidney stones    Lupus    possible incorrect diagnosis, negative lupus anticoagulant, negative APL   Miscarriage 04/30/2022   Peptic ulcer    PMDD (premenstrual dysphoric disorder) 02/28/2022   Supervision of high risk pregnancy, antepartum 01/09/2023              NURSING     PROVIDER      Office Location    [facility]    Dating by    LMP c/w U/S at 6+6 wks      [facility] Model    Traditional    Anatomy U/S    04/07/2023      Initiated care at     [facility]  Language     English                     LAB RESULTS       Support Person    Will Conrad    Genetics    NIPS: LR/ female  AFP: neg                NT/IT (FT only)                      Tachycardia 05/28/2022   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 06/04/2020   Bipolar disorder, current episode mixed, moderate (HCC) 06/20/2015    PSurg:  Past Surgical History:  Procedure Laterality Date   CYST EXCISION      EYE   ESOPHAGOGASTRODUODENOSCOPY     TUBAL LIGATION Bilateral 08/23/2023   Procedure: LIGATION, FALLOPIAN TUBE, POSTPARTUM;  Surgeon: Jayne Vonn DEL, MD;  Location: [facility] LD ORS;  Service: Gynecology;  Laterality: Bilateral;    Allergies: Penicillins and Zoloft [sertraline]  Meds:  Previous Medications   AMPHETAMINE -DEXTROAMPHETAMINE  (ADDERALL XR) 10 MG 24 HR CAPSULE    Take 1 capsule (10 mg total) by mouth every morning.   AMPHETAMINE -DEXTROAMPHETAMINE  (ADDERALL XR) 10 MG 24 HR CAPSULE    Take 1 capsule (10 mg total) by mouth every morning.   AMPHETAMINE -DEXTROAMPHETAMINE  (ADDERALL XR) 10 MG 24 HR CAPSULE    Take 1 capsule (10 mg total) by mouth every morning.   AMPHETAMINE -DEXTROAMPHETAMINE  (ADDERALL) 5 MG TABLET    Take 1 tablet (5 mg total) by mouth daily after lunch.   AMPHETAMINE -DEXTROAMPHETAMINE  (ADDERALL) 5 MG TABLET    Take 1 tablet (5 mg total) by mouth daily after lunch.   AMPHETAMINE -DEXTROAMPHETAMINE  (ADDERALL) 5 MG TABLET    Take 1 tablet (5 mg total) by mouth daily after lunch.   CLONAZEPAM  (KLONOPIN ) 0.5 MG TABLET    Take 1 tablet (0.5 mg total) by mouth daily as needed for anxiety.   IBUPROFEN  (ADVIL ) 600 MG TABLET    Take 1 tablet (600 mg total) by mouth every 6 (six) hours.   OMEPRAZOLE  (PRILOSEC) 20 MG CAPSULE    Take 20 mg by mouth daily.   ONDANSETRON  (ZOFRAN -ODT) 4 MG DISINTEGRATING TABLET    Take 1 tablet (4 mg total) by mouth every 6 (six) hours as needed.   PRENATAL VIT-FE FUMARATE-FA (MULTIVITAMIN-PRENATAL) 27-0.8 MG TABS TABLET    Take 1 tablet by mouth daily at 12 noon.   VENTOLIN  HFA 108 (90 BASE) MCG/ACT INHALER    Inhale 2 puffs into the lungs every 6 (six) hours as needed.    Social:  Social History   Tobacco Use   Smoking status: Former    Current packs/day: 0.00    Types: Cigarettes    Quit date: 02/26/2015    Years since quitting: 8.8   Smokeless tobacco: Never  Substance Use Topics   Alcohol use: Not Currently    Alcohol/week: 1.0 standard drink  of alcohol    Types: 1 Standard drinks or equivalent per week    Comment: 1 per week    REVIEW OF SYSTEMS:  ROS negative except as  noted in HPI above   Objective Exam:  Vitals:   12/19/23 0936  BP: 131/86  Weight: 170 lb (77.1 kg)  Height: 5' 4 (1.626 m)    GENERAL: Patient is afebrile, Vital signs reviewed, well appearing, Patient appears comfortable, Alert and lucid. No apparent distress.   Physical Exam   Ortho Exam:  On inspection patient has tenderness palpation over spinous process of C5-C7 of her neck.  She endorses numbness and tingling at base of neck that has been chronic for multiple years.  She has full active and passive range of motion in all planes of motion of her neck.  Sidebending to her neck to the right side does cause radicular symptoms all the way into her right hand.  At time of testing strength in her upper extremities is within normal limits except mildly decreased grip strength on right side.  Neurovascularly intact.  Reflexes 2+ at brachioradialis, biceps, triceps bilaterally.  Hoffman's test negative.  Spurling's test positive.  Patient has tenderness to palpation over spinous process of L4-S1.  Also has tenderness over sacrum.  Extension of low back worsens bilateral radicular lower extremity pain.  Patient has 4-5/5 weakness with hip flexion and knee extension bilaterally, left worse than right.  Remaining lower extremity strength 5/5 bilaterally.       Assessment/Plan:  1. Cervical radiculopathy   2. RUE weakness   3. Lumbar radiculopathy   4. Weakness of both lower extremities   5. Unexplained recurrent falls    Patient has evidence of moderate/severe cervical and lumbar radiculopathy symptoms on HPI and physical exam.  Despite progression of symptoms over the last decade she has not had any repeat imaging since 2014.  She has never had any imaging on her lumbar spine since she received epidural during her most recent childbirth 4 months ago.   Start with cervical and lumbar x-rays followed by MRIs for evaluation of nerve root compromise.  Will also give patient a short steroid burst of 40 mg x 5 days to see if this improves any of her symptoms.  Patient will follow back up in 2-3 weeks for reviewing MRIs and further evaluation of her symptoms s/p steroid burst. - Patient understands and agrees to treatment plan.  No further questions or concerns at this time.   New Prescriptions   No medications on file    Medications, medical history, allergies, surgical history, hospitalizations, family history, social history, ROS and vitals entered by nursing staff and reviewed by myself.  I discussed with the patient the diagnosis, treatment plan, indications for return to the emergency department, and for expected follow-up. The patient verbalized an understanding. The patient is asked if there are any questions or concerns. We discuss the case, until all issues are addressed to the patient's satisfaction.  Follow up per instructions including returning for additional office visit if symptoms worsen or proceeding to the emergency department or urgent care in the next 12-24hrs if there is an acute concerning increasing symptoms, pain, fevers, or other symptoms.  Prentice Agent, DO  5:14 PM, 12/19/2023

## 2023-12-20 NOTE — Progress Notes (Signed)
 Operating Room Services: Attending Note: I have examined the patient, reviewed the chart, discussed the assessment and plan with the Sports Medicine Fellow. I agree with assessment and treatment plan as detailed in the Fellow's note. Cervical radiculopathy right upper extremity, present for years with abnormal CT scan previously.  Given continued symptoms and prior abnormal imaging, will MRI C-spine. 2.  Low back pain plus minus right lower extremity radiculopathy.  On my exam she was not weak on hip extension or knee flexion.  DTRs were 2+ knee and ankle bilaterally.  I will order MRI low back.  Follow-up after imaging.

## 2023-12-23 ENCOUNTER — Encounter: Payer: Self-pay | Admitting: Family Medicine

## 2023-12-26 ENCOUNTER — Encounter: Payer: Self-pay | Admitting: Family Medicine

## 2023-12-30 ENCOUNTER — Encounter: Payer: Self-pay | Admitting: Family Medicine

## 2023-12-31 ENCOUNTER — Inpatient Hospital Stay: Admission: RE | Admit: 2023-12-31 | Discharge: 2023-12-31 | Attending: Family Medicine | Admitting: Family Medicine

## 2023-12-31 ENCOUNTER — Other Ambulatory Visit

## 2023-12-31 DIAGNOSIS — M5412 Radiculopathy, cervical region: Secondary | ICD-10-CM

## 2024-01-02 ENCOUNTER — Ambulatory Visit: Admitting: Family Medicine

## 2024-01-02 ENCOUNTER — Telehealth: Payer: Self-pay | Admitting: Family Medicine

## 2024-01-02 NOTE — Telephone Encounter (Signed)
 Telephone call to patient. She had to cancel appt due to transportation issues today. I did wantt o talk to her about her MRI cervical spine.  1. Definitely see some stenosis on my read although formal radiology report not complete yet. 2. There is a possibility, small but there, that her lower extremity issues may be coming from the neck. Not likely, but possible. 3. My recommendation would be to have her see [facility] for further eval of neck. I suspect they would recommend ESI first. Could also consider diagnostic nerve block at c spine are and see if it resolves issues in lower extremity.; Could also get [facility] opinion on this.  4.Then I would consider further eval of lumbar area if symptoms persist.  Her insurance has denied lumbar MRI. We can try to get it approved but not having much luck with that recently.

## 2024-01-07 ENCOUNTER — Encounter: Payer: Self-pay | Admitting: Family Medicine

## 2024-01-09 ENCOUNTER — Ambulatory Visit (INDEPENDENT_AMBULATORY_CARE_PROVIDER_SITE_OTHER)

## 2024-01-09 VITALS — BP 124/80 | Ht 64.0 in | Wt 170.0 lb

## 2024-01-09 DIAGNOSIS — M5416 Radiculopathy, lumbar region: Secondary | ICD-10-CM

## 2024-01-09 DIAGNOSIS — N3942 Incontinence without sensory awareness: Secondary | ICD-10-CM | POA: Diagnosis not present

## 2024-01-09 DIAGNOSIS — R29898 Other symptoms and signs involving the musculoskeletal system: Secondary | ICD-10-CM | POA: Diagnosis not present

## 2024-01-09 DIAGNOSIS — R296 Repeated falls: Secondary | ICD-10-CM

## 2024-01-09 DIAGNOSIS — M5412 Radiculopathy, cervical region: Secondary | ICD-10-CM

## 2024-01-09 MED ORDER — MELOXICAM 15 MG PO TABS: 15.0000 mg | ORAL_TABLET | Freq: Every day | ORAL | 0 refills | Status: DC | PRN

## 2024-01-09 MED ORDER — PREDNISONE 10 MG PO TABS: ORAL_TABLET | ORAL | 0 refills | Status: AC

## 2024-01-09 NOTE — Progress Notes (Signed)
 [facility] A Department of [facility] [place]. [facility]   PCP: Lucius Krabbe, NP  CHIEF COMPLAINT: Persistent neck and low back pain with radiculopathy and weakness  INSURANCE- PLEASE READ NOTE IN ENTIRETY   HPI: Patient is a pleasant 33 y.o. female who presents today for follow-up regarding cervical and lumbar radiculopathy with spontaneous lower extremity weakness calling recurrent falls.  Patient was last seen on 12/15/2023.  She has had a neck MRI since that time but her lumbar spine MRI was denied.  Neck MRI showed: IMPRESSION: 1. Right-sided disc bulge and uncovertebral hypertrophy at C4-5 resulting in mild-to-moderate right lateral recess/spinal canal stenosis and mild right neural foraminal stenosis; no definite nerve root impingement. 2. Slight reversal of normal cervical lordosis, which may be positional or related to muscle spasm. 3. No additional significant stenosis at other cervical levels; spinal cord normal in morphology and signal.  In the interim since patient patient was last seen she continues to have persistent numbness around base of her neck at location C6-C7.  She has intermittent radiculopathy going into her right arm which makes her entire hand go numb.  This is worse with overhead activities or driving a car.  Patient has been completing the provided home exercise program for both her neck and low back with emphasis on range of motion exercises and spinal stability exercises.  She says exercises have not made significant difference in symptoms but brief steroid burst did significantly help although symptoms returned 2 days after finishing medication  In regard to her low back symptoms, despite the concerning nature of her low back symptoms with onset after recent epidural during childbirth, insurance has inappropriately denied coverage of her MRI.  She continues to have radicular lower extremity pain/weakness with intermittent episode  of flaccid weakness causing falls.  These random episodes of falling have occurred while out walking with her kids and are unpredictable.  Never had low back pain prior to epidural.  At her last office visit we prescribed a home exercise program for her and she has been completing the home exercise program we gave her for low back pain and radiculopathy diligently but this is yet to improve any of her symptoms.  I am not surprised by her lack of improvement as this is  not our typical low back pain presentation and is in a higher risk category given recent procedural spinal intervention of lumbar spine.  Today patient states since she was last here she has now begun to develop symptoms of both intermittent urinary retention and urinary incontinence.  Says she feels like she has to pee all the time but when she attempts she is only able to partially empty her bladder.  She also endorses frequent leakage of urine and has to wear a pad at all times.  She also has symptoms of borderline fecal incontinence and states she does not feel safe leaving the home because she will frequently have to have a bowel movement with only seconds of warning prior to stool coming out.  She also endorses new onset groin and genital numbness/tingling.  Feels like it spreading distally down her legs.  Denies any fevers or chills at this time.  All other symptoms from last visit including severe low back pain with radiculopathy and intermittent weakness have persisted.   PMH:  Past Medical History:  Diagnosis Date   [redacted] weeks gestation of pregnancy 08/22/2023   Anxiety    Asthma    Bipolar 1  disorder, depressed (HCC) 02/01/2016   Chronic pain syndrome 06/20/2015   Formatting of this note might be different from the original. RUE/RLE weakness, numbness tingling Generalized abdominal pain     Depression    Gastroparesis    GERD (gastroesophageal reflux disease)    Hiatal hernia    Irritable bowel syndrome 09/28/2015   Kidney  stones    Lupus    possible incorrect diagnosis, negative lupus anticoagulant, negative APL   Miscarriage 04/30/2022   Peptic ulcer    PMDD (premenstrual dysphoric disorder) 02/28/2022   Supervision of high risk pregnancy, antepartum 01/09/2023              NURSING     PROVIDER      Office Location    [facility]    Dating by    LMP c/w U/S at 6+6 wks      [facility] Model    Traditional    Anatomy U/S    04/07/2023      Initiated care at     [facility]                           Language     English                     LAB RESULTS       Support Person    Will Conrad    Genetics    NIPS: LR/ female  AFP: neg                NT/IT (FT only)                      Tachycardia 05/28/2022   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 06/04/2020   Bipolar disorder, current episode mixed, moderate (HCC) 06/20/2015    PSurg:  Past Surgical History:  Procedure Laterality Date   CYST EXCISION     EYE   ESOPHAGOGASTRODUODENOSCOPY     TUBAL LIGATION Bilateral 08/23/2023   Procedure: LIGATION, FALLOPIAN TUBE, POSTPARTUM;  Surgeon: Jayne Vonn DEL, MD;  Location: [facility] LD ORS;  Service: Gynecology;  Laterality: Bilateral;    Allergies: Penicillins and Zoloft [sertraline]  Meds:  Previous Medications   AMPHETAMINE -DEXTROAMPHETAMINE  (ADDERALL XR) 10 MG 24 HR CAPSULE    Take 1 capsule (10 mg total) by mouth every morning.   AMPHETAMINE -DEXTROAMPHETAMINE  (ADDERALL XR) 10 MG 24 HR CAPSULE    Take 1 capsule (10 mg total) by mouth every morning.   AMPHETAMINE -DEXTROAMPHETAMINE  (ADDERALL XR) 10 MG 24 HR CAPSULE    Take 1 capsule (10 mg total) by  mouth every morning.   AMPHETAMINE -DEXTROAMPHETAMINE  (ADDERALL) 5 MG TABLET    Take 1 tablet (5 mg total) by mouth daily after lunch.   AMPHETAMINE -DEXTROAMPHETAMINE  (ADDERALL) 5 MG TABLET    Take 1 tablet (5 mg total) by mouth daily after lunch.   AMPHETAMINE -DEXTROAMPHETAMINE  (ADDERALL) 5 MG TABLET    Take 1 tablet (5 mg total) by mouth daily after lunch.   CLONAZEPAM  (KLONOPIN ) 0.5 MG TABLET    Take 1 tablet (0.5 mg total) by mouth daily as needed  for anxiety.   IBUPROFEN  (ADVIL ) 600 MG TABLET    Take 1 tablet (600 mg total) by mouth every 6 (six) hours.   OMEPRAZOLE  (PRILOSEC) 20 MG CAPSULE    Take 20 mg by mouth daily.   ONDANSETRON  (ZOFRAN -ODT) 4 MG DISINTEGRATING TABLET    Take 1 tablet (4 mg total) by mouth every 6 (six) hours as needed.   PRENATAL VIT-FE FUMARATE-FA (MULTIVITAMIN-PRENATAL) 27-0.8 MG TABS TABLET    Take 1 tablet by mouth daily at 12 noon.   VENTOLIN  HFA 108 (90 BASE) MCG/ACT INHALER    Inhale 2 puffs into the lungs every 6 (six) hours as needed.    Social:  Social History   Tobacco Use   Smoking status: Former    Current packs/day: 0.00    Types: Cigarettes    Quit date: 02/26/2015    Years since quitting: 8.8   Smokeless tobacco: Never  Substance Use Topics   Alcohol use: Not Currently    Alcohol/week: 1.0 standard drink of alcohol    Types: 1 Standard drinks or equivalent per week    Comment: 1 per week    REVIEW OF SYSTEMS:  ROS negative except as noted in HPI above   Objective Exam:  Vitals:   01/09/24 0921  BP: 124/80  Weight: 170 lb (77.1 kg)  Height: 5' 4 (1.626 m)    GENERAL: Patient is afebrile, Vital signs reviewed, well appearing, Patient appears comfortable, Alert and lucid. No apparent distress.   Physical Exam   Ortho Exam:  Similar to previous exam with some minor worsening of symptoms: On inspection patient has tenderness palpation over spinous process of C5-C7 of her neck.  She endorses numbness and tingling at base of neck.   She has full active and passive range of motion in all planes of motion of her neck.  Sidebending to her neck to the right side does cause radicular symptoms all the way into her right hand.  At time of testing strength in her upper extremities is within normal limits except mildly decreased grip strength on right side.  Neurovascularly intact.  Reflexes 2+ at brachioradialis, biceps, triceps bilaterally.  Hoffman's test negative.  Spurling's test positive.   Patient has tenderness to palpation over spinous process of L4-S1.  Also has tenderness over sacrum.  Extension of low back worsens bilateral radicular lower extremity pain.  Patient has 3/5 weakness with hip flexion and knee extension bilaterally, left worse than right.  Positive straight leg raise test with bilateral legs, right worse than left.  Remaining lower extremity strength 5/5 bilaterally.   RESULTS:  Labs: No results found for this or any previous visit (from the past 48 hours).  Imaging:  MR Lumbar Spine Wo Contrast    (Results Pending)    Assessment/Plan:  1. Cervical radiculopathy   2. RUE weakness   3. Lumbar radiculopathy   4. Weakness of both lower extremities   5. Unexplained recurrent falls   6. Urinary incontinence without sensory awareness   Cervical Radiculopathy: - Recent cervical MRI does not show any acutely concerning findings but does show evidence for radicular pain.  Patient did have significant relief with steroid burst.  Given her severe low back symptoms, and success with oral steroids in the past we will give her an extended 14-day course of prednisone starting at 60 mg and decreasing by 10 mg every 2 days with 2 days of 5 mg at the very end.  After she has completed steroid taper she  may begin meloxicam as needed for low back pain. - Referral sent to neurosurgery for consideration of ESI however this will likely have to be deferred given more urgent low back pain symptoms Lumbar Radiculopathy w/ LE  Weakness:  I am not surprised by her lack of improvement as this is not a typical low back pain presentation. Patient is a higher risk category given recent procedural intervention of lumbar spine from epidural during childbirth.  She never had any symptoms of low back pain,radiculopathy, or lower extremity weakness prior to epidural. Because of ~recent~ direct spinal intervention and presence of concerning symptoms including perineal numbness/tingling, urinary mixed incontinence/retention, BM urgency, and intermittent LE flaccid weakness, 6 weeks of physical therapy or home exercise program should not be required for STAT MRI approval.  However, she will continue her low back exercises that she has been doing for the last month and we will send official referral to PT as well as neurosurgery however patient still ultimately needs a stat lumbar spine MRI.  We will send a new order to get this done at today's visit.  Discussed with patient that insurance should cover MRI given severity of issue. -Repeatedly discussed ER precautions including but not limited to worsening of pain/numbness, complete inability to urinate over the course of 3 hours, development of fevers or chills, or progression of lower extremity weakness. - Patient understands and agrees to treatment plan.  No further questions or concerns at this time.  New Prescriptions   MELOXICAM (MOBIC) 15 MG TABLET    Take 1 tablet (15 mg total) by mouth daily as needed for pain. Do not start meloxicam until after completion of steroid taper   PREDNISONE (DELTASONE) 10 MG TABLET    Take 6 tablets (60 mg total) by mouth daily with breakfast for 2 days, THEN 5 tablets (50 mg total) daily with breakfast for 2 days, THEN 4 tablets (40 mg total) daily with breakfast for 2 days, THEN 3 tablets (30 mg total) daily with breakfast for 2 days, THEN 2 tablets (20 mg total) daily with breakfast for 2 days, THEN 1 tablet (10 mg total) daily with breakfast for 2 days,  THEN 0.5 tablets (5 mg total) daily with breakfast for 2 days.    Medications, medical history, allergies, surgical history, hospitalizations, family history, social history, ROS and vitals entered by nursing staff and reviewed by myself.  I discussed with the patient the diagnosis, treatment plan, indications for return to the emergency department, and for expected follow-up. The patient verbalized an understanding. The patient is asked if there are any questions or concerns. We discuss the case, until all issues are addressed to the patient's satisfaction.  Follow up per instructions including returning for additional office visit if symptoms worsen or proceeding to the emergency department or urgent care in the next 12-24hrs if there is an acute concerning increasing symptoms, pain, fevers, or other symptoms.  Prentice Agent, DO  5:37 PM, 01/09/2024

## 2024-01-18 ENCOUNTER — Ambulatory Visit
Admission: RE | Admit: 2024-01-18 | Discharge: 2024-01-18 | Disposition: A | Discharge status: Home / Self Care | Source: Ambulatory Visit

## 2024-01-18 DIAGNOSIS — M5416 Radiculopathy, lumbar region: Secondary | ICD-10-CM

## 2024-01-18 DIAGNOSIS — R296 Repeated falls: Secondary | ICD-10-CM

## 2024-01-18 DIAGNOSIS — R29898 Other symptoms and signs involving the musculoskeletal system: Secondary | ICD-10-CM

## 2024-01-26 ENCOUNTER — Ambulatory Visit (HOSPITAL_COMMUNITY): Payer: Self-pay

## 2024-01-26 ENCOUNTER — Other Ambulatory Visit: Payer: Self-pay

## 2024-01-26 DIAGNOSIS — R29898 Other symptoms and signs involving the musculoskeletal system: Secondary | ICD-10-CM

## 2024-01-26 DIAGNOSIS — R296 Repeated falls: Secondary | ICD-10-CM

## 2024-02-04 ENCOUNTER — Encounter: Payer: Self-pay | Admitting: Family

## 2024-02-04 DIAGNOSIS — F411 Generalized anxiety disorder: Secondary | ICD-10-CM

## 2024-02-06 MED ORDER — CLONAZEPAM 0.5 MG PO TABS: 0.5000 mg | ORAL_TABLET | Freq: Every day | ORAL | 0 refills | Status: DC | PRN

## 2024-02-06 NOTE — Progress Notes (Signed)
 "  Referring Physician:  Lynwood Barter, DO [address] [place],  [place] [address]  Primary Physician:  Lucius Krabbe, NP  History of Present Illness: 02/10/2024 [name] is here today with a chief complaints of both neck and back pain.  Both have been longstanding but have become progressively worse recently after having her fourth child.  She has a constant pain in the back of her neck that radiates into her right shoulder is onto the back of her head.  She feels a constant numbness and tingling in her right arm.  She notices that she is dropping things more often and it is hard to open jars.  She feels as though her fine motor skills are becoming worse.  This is primarily in her first 2 digits, but she notices that if her arm is bent she will have numbness and tingling in her pinky.  She often will wake up from her sleep at night due to numbness and tingling in her right hand.  In addition she also has some pain that is constant in her low back.  She has radiating pain into bilateral lower extremities into the top of her feet.  She has also noticed decree sensation to her inner thigh.  She feels as though she is tripping and her legs feel very heavy.  She notices that she is having a harder time going up stairs.  Her pain is exacerbated when sitting or standing too long.  Denies any saddle anesthesia or incontinence.    Bowel/Bladder Dysfunction: none  Conservative measures:  Physical therapy: Has not participated in for neck recently, no PT for her back but was giving exercises to complete at home Multimodal medical therapy including regular antiinflammatories: meloxicam , ibuprofen  Injections: 02/24/2013-Cervical MBB-. Right 3rd occipital nerve, C3, C4 and C5.    Past Surgery: no surgeries  Meagan Hunter has no symptoms of cervical myelopathy.  The symptoms are causing a significant impact on the patient's life.   Review of Systems:  A 10 point review of systems is  negative, except for the pertinent positives and negatives detailed in the HPI.  Past Medical History: Past Medical History:  Diagnosis Date   [redacted] weeks gestation of pregnancy 08/22/2023   Anxiety    Asthma    Bipolar 1 disorder, depressed (HCC) 02/01/2016   Chronic pain syndrome 06/20/2015   Formatting of this note might be different from the original. RUE/RLE weakness, numbness tingling Generalized abdominal pain     Depression    Gastroparesis    GERD (gastroesophageal reflux disease)    Hiatal hernia    Irritable bowel syndrome 09/28/2015   Kidney stones    Lupus    possible incorrect diagnosis, negative lupus anticoagulant, negative APL   Miscarriage 04/30/2022   Peptic ulcer    PMDD (premenstrual dysphoric disorder) 02/28/2022   Supervision of high risk pregnancy, antepartum 01/09/2023              NURSING     PROVIDER      Office Location    [facility]    Dating by    LMP c/w U/S at 6+6 wks      [facility] Model    Traditional    Anatomy U/S    04/07/2023      Initiated care at     [facility] [employer]  English                     LAB RESULTS       Support Person    Will Conrad    Genetics    NIPS: LR/ female  AFP: neg                NT/IT (FT only)                      Syncope and collapse    Tachycardia 05/28/2022   Upper extremity neuropathy 02/08/2013   Vulvodynia, 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    Past Surgical History: Past Surgical History:  Procedure Laterality Date   CYST EXCISION     EYE   ESOPHAGOGASTRODUODENOSCOPY     TUBAL LIGATION Bilateral 08/23/2023   Procedure: LIGATION, FALLOPIAN TUBE, POSTPARTUM;  Surgeon: Jayne Vonn DEL, MD;  Location: [facility] LD ORS;  Service: Gynecology;  Laterality: Bilateral;    Allergies: Allergies as of 02/10/2024 - Review Complete 01/09/2024  Allergen Reaction Noted   Penicillins Hives, Itching, Nausea And Vomiting, and Other (See Comments) 10/30/2012   Zoloft [sertraline] Other (See Comments) 12/18/2015     Medications: Outpatient Encounter Medications as of 02/10/2024  Medication Sig   amphetamine -dextroamphetamine  (ADDERALL XR) 10 MG 24 hr capsule Take 1 capsule (10 mg total) by mouth every morning.   amphetamine -dextroamphetamine  (ADDERALL) 5 MG tablet Take 1 tablet (5 mg total) by mouth daily after lunch.   clonazePAM  (KLONOPIN ) 0.5 MG tablet Take 1 tablet (0.5 mg total) by mouth daily as needed for anxiety.   ibuprofen  (ADVIL ) 600 MG tablet Take 1 tablet (600 mg total) by mouth every 6 (six) hours.   omeprazole  (PRILOSEC) 20 MG capsule Take 20 mg by mouth daily.   ondansetron  (ZOFRAN -ODT) 4 MG disintegrating tablet Take 1 tablet (4 mg total) by mouth every 6 (six) hours as needed.   Prenatal Vit-Fe Fumarate-FA (MULTIVITAMIN-PRENATAL) 27-0.8 MG TABS tablet Take 1 tablet by mouth daily at 12 noon.   VENTOLIN  HFA 108 (90 Base) MCG/ACT inhaler Inhale 2 puffs into the lungs every 6 (six) hours as needed.   meloxicam  (MOBIC ) 15 MG tablet Take 1 tablet (15 mg total) by mouth daily as needed for pain. Do not start meloxicam  until after completion of steroid taper (Patient not taking: Reported on 02/10/2024)   [DISCONTINUED] amphetamine -dextroamphetamine  (ADDERALL XR) 10 MG 24 hr capsule Take 1 capsule (10 mg total) by mouth every morning.   [DISCONTINUED] amphetamine -dextroamphetamine  (ADDERALL XR) 10 MG 24 hr capsule Take 1 capsule (10 mg total) by mouth every morning.   [DISCONTINUED] amphetamine -dextroamphetamine  (ADDERALL) 5 MG tablet Take 1 tablet (5 mg total) by mouth daily after lunch.   [DISCONTINUED] amphetamine -dextroamphetamine  (ADDERALL) 5 MG tablet Take 1 tablet (5 mg total) by mouth daily after lunch.   No facility-administered encounter medications on file as of 02/10/2024.    Social History: Social History[1]  Family Medical History: Family History  Problem Relation Age of Onset   Hypertension Mother    Ovarian cancer Mother    Thyroid cancer Mother    Crohn's disease  Mother    Anemia Mother    Hypertension Father    Hyperlipidemia Father    Heart failure Father    Arrhythmia Father    Heart disease Father    Hashimoto's thyroiditis Sister    Anemia Sister    Asthma Sister    Breast cancer Maternal Aunt    Heart failure Maternal Grandfather  Heart failure Paternal Grandfather    Colon cancer Paternal Grandfather    Heart disease Paternal Grandfather     Physical Examination: @VITALWITHPAIN @  General: Patient is well developed, well nourished, calm, collected, and in no apparent distress. Attention to examination is appropriate.  Psychiatric: Patient is non-anxious.  Head:  Pupils equal, round, and reactive to light.  ENT:  Oral mucosa appears well hydrated.  Neck:   Supple.  Full range of motion.  Respiratory: Patient is breathing without any difficulty.  Extremities: No edema.  Vascular: Palpable dorsal pedal pulses.  Skin:   On exposed skin, there are no abnormal skin lesions.  NEUROLOGICAL:     Awake, alert, oriented to person, place, and time.  Speech is clear and fluent. Fund of knowledge is appropriate.   Cranial Nerves: Pupils equal round and reactive to light.  Facial tone is symmetric.   ROM of spine: Some tenderness palpation of the cervical and lumbar paraspinals.  RUE: Positive carpal compression test, positive Tinel, positive Phalen's, positive reverse Phalen's, Positive Tinel of right ulnar nerve.   Strength: Side Biceps Triceps Deltoid Interossei Grip Wrist Ext. Wrist Flex.  R 5 5 5 5 5 5 5   L 5 5 5 5 5 5 5    Side Iliopsoas Quads Hamstring PF DF EHL  R 5 5 5 5 5 5   L 5 5 5 5 5 5    Reflexes are 2+ and symmetric at the biceps, triceps, brachioradialis, patella and achilles.   Hoffman's is absent.  Clonus is not present.  Toes are down-going.  Bilateral upper and lower extremity sensation is intact to light touch.  Some minimal decrease sensation to bilateral lower extremities, Gait is normal.   No  difficulty with tandem gait.   No evidence of dysmetria noted.  Medical Decision Making  Imaging: EXAM: MRI CERVICAL SPINE WITHOUT CONTRAST 12/31/2023 07:31:35 AM   TECHNIQUE: Multiplanar multisequence MRI of the cervical spine was performed.   COMPARISON: None available.   CLINICAL HISTORY: Cervical radiculopathy, numbness in the right shoulder and hand.   FINDINGS:   BONES AND ALIGNMENT: There is slight reversal of the normal cervical lordosis. Normal vertebral body heights. Bone marrow signal is unremarkable. The spinal canal and neural foramina are otherwise widely patent throughout the cervical spine, except for the findings at C4-C5.   SPINAL CORD: Normal spinal cord size. No abnormal spinal cord signal.   SOFT TISSUES: No paraspinal mass.   C2-C3: No significant disc herniation. No spinal canal stenosis or neural foraminal narrowing.   C3-C4: No significant disc herniation. No spinal canal stenosis or neural foraminal narrowing.   C4-C5: There is right-sided disc bulging and uncovertebral joint hypertrophy at C4-C5, which is causing mild-to-moderate right-sided spinal canal stenosis and mild right neural foraminal stenosis. There is no definite nerve root impingement. The left neural foramen is patent.   C5-C6: No significant disc herniation. No spinal canal stenosis or neural foraminal narrowing.   C6-C7: No significant disc herniation. No spinal canal stenosis or neural foraminal narrowing.   C7-T1: No significant disc herniation. No spinal canal stenosis or neural foraminal narrowing.   IMPRESSION: 1. Right-sided disc bulge and uncovertebral hypertrophy at C4-5 resulting in mild-to-moderate right lateral recess/spinal canal stenosis and mild right neural foraminal stenosis; no definite nerve root impingement. 2. Slight reversal of normal cervical lordosis, which may be positional or related to muscle spasm. 3. No additional significant  stenosis at other cervical levels; spinal cord normal in morphology and signal.  EXAM: MRI LUMBAR SPINE 01/18/2024 07:58:34 AM   TECHNIQUE: Multiplanar multisequence MRI of the lumbar spine was performed without the administration of intravenous contrast.   COMPARISON: Lumbar radiographs 12/19/2023. Normal lumbar segmentation on the comparison.   CLINICAL HISTORY: 33 year old female with persistent lumbar radiculopathy, new and worsening symptoms.   FINDINGS:   BONES AND ALIGNMENT: Normal alignment. Normal vertebral body heights. Bone marrow signal is unremarkable. Intact visible sacrum and SI joints. No marrow edema.   SPINAL CORD: Capacious lumbar spinal canal. Normal conus medullaris at L1-L2. No signal abnormality in the visible lower thoracic spinal cord or conus. Normal cauda equina nerve roots. Normal lumbar epidural space.   SOFT TISSUES: No paraspinal mass.   DEGENERATIVE:   Largely normal intervertebral disc signal and morphology.     At L4-L5 there is subtle disc bulging but relatively maintained disc signal and height. Mild posterior element hypertrophy at that level. No stenosis.   IMPRESSION: 1. Essentially normal for age MRI appearance of the lumbar spine. 2. Lumbar epidural space, lower thoracic spinal cord and conus, and cauda equina nerve roots appear normal.   I have personally reviewed the images and agree with the above interpretation.  Assessment and Plan: Ms. Litts is a pleasant 33 y.o. female with both neck and low back pain.  Her pain in her neck radiates up to the back of her head as well as down her right arm.  Numbness and tingling is primarily in her 1st and 2nd digit or when her elbow is in flexion and is in her pinky finger.  Based off examination, I am concerned for both carpal tunnel syndrome and ulnar neuropathy in her right upper extremity.  I would like her to undergo an EMG of her right upper extremity to evaluate for multiple  peripheral neuropathies versus a cervical radiculopathy.  Her MRI does show a right sided disc bulge at C4-5.  In regards to her low back, she has constant low back pain radiating to bilateral lower extremities equally.  She feels though there has been some changes to her gait.  On her MRI there is no significant changes or concerns.  Could be myofascial pain.  Plan includes the following moving forward:  -Flexion and extension x-rays of both cervical and lumbar spine. - Patient has been doing home exercises, but needs formal physical therapy.  Referral has been placed for this. - Patient has previously undergone cervical injections before.  Would like her to discuss with the pain team regarding additional injections to try to help with her pain. - Referral for EMG.  As stated above, provocative symptoms for both right carpal tunnel syndrome and slight right ulnar neuropathy.  Would like to consider this versus cervical radiculopathy on EMG. - Discussed with the patient gabapentin  to try to help with her neuropathic pain.  The risk and benefits regarding this medication were discussed at length.  She would like to trial this medication. - Plan to see back in approximately 8 weeks    Thank you for involving me in the care of this patient.    Lyle Decamp, PA-C Dept. of Neurosurgery      [1]  Social History Tobacco Use   Smoking status: Former    Current packs/day: 0.00    Types: Cigarettes    Quit date: 02/26/2015    Years since quitting: 8.9   Smokeless tobacco: Never  Vaping Use   Vaping status: Never Used  Substance Use Topics   Alcohol use: Not Currently  Alcohol/week: 1.0 standard drink of alcohol    Types: 1 Standard drinks or equivalent per week    Comment: 1 per week   Drug use: Never   "

## 2024-02-06 NOTE — Telephone Encounter (Signed)
 RX sent, keep f/u appt

## 2024-02-10 ENCOUNTER — Ambulatory Visit (INDEPENDENT_AMBULATORY_CARE_PROVIDER_SITE_OTHER): Admitting: Physician Assistant

## 2024-02-10 ENCOUNTER — Encounter: Payer: Self-pay | Admitting: Physician Assistant

## 2024-02-10 ENCOUNTER — Ambulatory Visit

## 2024-02-10 VITALS — BP 108/72 | Ht 64.0 in | Wt 166.4 lb

## 2024-02-10 DIAGNOSIS — G8929 Other chronic pain: Secondary | ICD-10-CM

## 2024-02-10 DIAGNOSIS — M542 Cervicalgia: Secondary | ICD-10-CM

## 2024-02-10 DIAGNOSIS — M5412 Radiculopathy, cervical region: Secondary | ICD-10-CM | POA: Diagnosis not present

## 2024-02-10 DIAGNOSIS — M5442 Lumbago with sciatica, left side: Secondary | ICD-10-CM

## 2024-02-10 DIAGNOSIS — R200 Anesthesia of skin: Secondary | ICD-10-CM

## 2024-02-10 DIAGNOSIS — M5441 Lumbago with sciatica, right side: Secondary | ICD-10-CM

## 2024-02-10 MED ORDER — GABAPENTIN 100 MG PO CAPS: 100.0000 mg | ORAL_CAPSULE | Freq: Three times a day (TID) | ORAL | 3 refills | Status: AC

## 2024-03-05 ENCOUNTER — Telehealth: Payer: Self-pay | Admitting: Physician Assistant

## 2024-03-05 NOTE — Telephone Encounter (Signed)
 Meagan Hunter 03/05/2024 03:37 PM EST lvm ------------------------------------ Meagan Hunter N 03/04/2024 04:12 PM EST lvm ------------------------------------ Meagan Hunter 03/03/2024 09:38 AM EST lvm   Per BF:I may not have put this in her AVS, but can we make sure that she has a 8-week follow-up appointment with me.

## 2024-03-12 ENCOUNTER — Ambulatory Visit: Admitting: Family

## 2024-03-14 ENCOUNTER — Ambulatory Visit
Admission: EM | Admit: 2024-03-14 | Discharge: 2024-03-14 | Disposition: A | Discharge status: Home / Self Care | Attending: Physician Assistant | Admitting: Physician Assistant

## 2024-03-14 ENCOUNTER — Other Ambulatory Visit: Payer: Self-pay

## 2024-03-14 DIAGNOSIS — R509 Fever, unspecified: Secondary | ICD-10-CM

## 2024-03-14 DIAGNOSIS — Z20828 Contact with and (suspected) exposure to other viral communicable diseases: Secondary | ICD-10-CM

## 2024-03-14 LAB — POCT INFLUENZA A/B
Influenza A, POC: NEGATIVE
Influenza B, POC: NEGATIVE

## 2024-03-14 MED ORDER — OSELTAMIVIR PHOSPHATE 75 MG PO CAPS: 75.0000 mg | ORAL_CAPSULE | Freq: Every day | ORAL | 0 refills | Status: DC

## 2024-03-14 NOTE — ED Provider Notes (Signed)
 [facility]    CSN: [id number] Arrival date & time: 03/14/24  1058      History   Chief Complaint Chief Complaint  Patient presents with   Cough   Fever   Headache   Nausea   Generalized Body Aches    HPI Meagan Hunter is a 34 y.o. female.    Cough Associated symptoms: fever and headaches   Fever Associated symptoms: cough and headaches   Headache Associated symptoms: cough and fever   Household member tested positive for influenza B, has not been feeling well since yesterday, symptoms include cough, headache, fever to 102, nausea, body aches.  Has history of asthma has had increased wheezing.  Denies rhinorrhea, nasal congestion, abdominal pain, vomiting, diarrhea, rash or skin changes, earache, sore throat. Past medical history asthma and lupus.  Past Medical History:  Diagnosis Date   [redacted] weeks gestation of pregnancy 08/22/2023   Anxiety    Asthma    Bipolar 1 disorder, depressed (HCC) 02/01/2016   Chronic pain syndrome 06/20/2015   Formatting of this note might be different from the original. RUE/RLE weakness, numbness tingling Generalized abdominal pain     Depression    Gastroparesis    GERD (gastroesophageal reflux disease)    Hiatal hernia    Irritable bowel syndrome 09/28/2015   Kidney stones    Lupus    possible incorrect diagnosis, negative lupus anticoagulant, negative APL   Miscarriage 04/30/2022   Peptic ulcer    PMDD (premenstrual dysphoric disorder) 02/28/2022   Supervision of high risk pregnancy, antepartum 01/09/2023              NURSING     PROVIDER      Office Location    [facility]    Dating by    LMP c/w U/S at 6+6 wks      [facility] Model    Traditional    Anatomy U/S    04/07/2023      Initiated care at     [facility]                           Language     English                     LAB RESULTS       Support Person    Will [place]    Genetics    NIPS: LR/ female  AFP: neg                NT/IT (FT only)                      Syncope and collapse     Tachycardia 05/28/2022   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 06/04/2020   Bipolar disorder, current episode mixed, moderate (HCC) 06/20/2015    Past Surgical History:  Procedure Laterality Date   CYST EXCISION     EYE   ESOPHAGOGASTRODUODENOSCOPY     TUBAL LIGATION Bilateral 08/23/2023   Procedure: LIGATION, FALLOPIAN TUBE, POSTPARTUM;  Surgeon: Jayne Vonn DEL, MD;  Location: [facility] LD ORS;  Service: Gynecology;  Laterality: Bilateral;    OB History     Gravida  5   Para  4   Term  4   Preterm  0   AB  1   Living  4      SAB  1   IAB  0   Ectopic  0   Multiple  0   Live Births  4            Home Medications    Prior to Admission medications  Medication Sig Start Date End Date Taking? Authorizing Provider  amphetamine -dextroamphetamine  (ADDERALL XR) 10 MG 24 hr capsule Take 1 capsule (10 mg total) by mouth every morning. 02/08/24 03/09/24  Lucius Krabbe, NP  amphetamine -dextroamphetamine  (ADDERALL) 5 MG tablet Take 1 tablet (5 mg total) by mouth daily after lunch. 02/08/24 03/09/24  Lucius Krabbe, NP  clonazePAM  (KLONOPIN ) 0.5 MG tablet Take 1 tablet (0.5 mg total) by mouth daily as needed for anxiety. 02/06/24   Lucius Krabbe, NP  gabapentin  (NEURONTIN ) 100 MG capsule Take 1 capsule (100 mg total) by mouth 3 (three) times daily. 02/10/24   Ulis Bottcher, PA-C  ibuprofen  (ADVIL ) 600 MG tablet Take 1 tablet (600 mg total) by mouth every 6 (six) hours. 08/24/23   Jayne Vonn DEL, MD  meloxicam  (MOBIC ) 15 MG tablet Take 1 tablet (15 mg total) by mouth daily as  needed for pain. Do not start meloxicam  until after completion of steroid taper Patient not taking: No sig reported 01/09/24   Lynwood Barter, DO  omeprazole  (PRILOSEC) 20 MG capsule Take 20 mg by mouth daily.    [provider]  ondansetron  (ZOFRAN -ODT) 4 MG disintegrating tablet Take 1 tablet (4 mg total) by mouth every 6 (six) hours as needed. 12/11/23   Lucius Krabbe, NP  Prenatal Vit-Fe Fumarate-FA (MULTIVITAMIN-PRENATAL) 27-0.8 MG TABS tablet Take 1 tablet by mouth daily at 12 noon.    [provider]  VENTOLIN  HFA 108 (90 Base) MCG/ACT inhaler Inhale 2 puffs into the lungs every 6 (six) hours as needed. 04/14/23   Lo, Arland POUR, CNM    Family History Family History  Problem Relation Age of Onset   Hypertension Mother    Ovarian cancer Mother    Thyroid cancer Mother    Crohn's disease Mother    Anemia Mother    Hypertension Father    Hyperlipidemia Father    Heart failure Father    Arrhythmia Father    Heart disease Father    Hashimoto's thyroiditis Sister    Anemia Sister    Asthma Sister    Heart failure Maternal Grandfather    Heart failure Paternal Grandfather    Colon cancer Paternal Grandfather    Heart disease Paternal Grandfather    Breast cancer Maternal Aunt     Social History Social History[1]   Allergies   Penicillins and Zoloft [sertraline]   Review of Systems Review of Systems  Constitutional:  Positive for fever.  Respiratory:  Positive for cough.   Neurological:  Positive for headaches.     Physical Exam Triage Vital Signs ED Triage Vitals  Encounter Vitals Group     BP 03/14/24 1129 119/76     Girls Systolic BP Percentile --      Girls Diastolic BP Percentile --      Boys Systolic BP Percentile --      Boys Diastolic BP Percentile --      Pulse Rate 03/14/24 1129 90  Resp 03/14/24 1129 20     Temp 03/14/24 1129 98.2 F (36.8 C)     Temp Source 03/14/24 1129 Oral     SpO2 03/14/24 1129 97 %     Weight --       Height --      Head Circumference --      Peak Flow --      Pain Score 03/14/24 1125 5     Pain Loc --      Pain Education --      Exclude from Growth Chart --    No data found.  Updated Vital Signs BP 119/76 (BP Location: Right Arm)   Pulse 90   Temp 98.2 F (36.8 C) (Oral)   Resp 20   LMP 02/10/2024   SpO2 97%   Breastfeeding No   Visual Acuity Right Eye Distance:   Left Eye Distance:   Bilateral Distance:    Right Eye Near:   Left Eye Near:    Bilateral Near:     Physical Exam Vitals and nursing note reviewed.  Constitutional:      Appearance: She is not ill-appearing.  HENT:     Head: Normocephalic.  Cardiovascular:     Rate and Rhythm: Normal rate and regular rhythm.  Pulmonary:     Effort: No respiratory distress.     Breath sounds: Normal breath sounds. No wheezing, rhonchi or rales.  Musculoskeletal:     Cervical back: Neck supple.  Lymphadenopathy:     Cervical: No cervical adenopathy.  Psychiatric:        Mood and Affect: Mood normal.      UC Treatments / Results  Labs (all labs ordered are listed, but only abnormal results are displayed) Labs Reviewed  POCT INFLUENZA A/B    EKG   Radiology No results found.  Procedures Procedures (including critical care time)  Medications Ordered in UC Medications - No data to display  Initial Impression / Assessment and Plan / UC Course  I have reviewed the triage vital signs and the nursing notes.  Pertinent labs & imaging results that were available during my care of the patient were reviewed by me and considered in my medical decision making (see chart for details).     34 year old female with history of asthma and lupus presents with less than 24 hours of flulike symptoms, exposed to influenza B at home.  Reports fever today of 102.  She is currently afebrile and nontoxic-appearing.  No evidence of bacterial infection on exam.  Lungs are clear to auscultation.  Point-of-care flu test is  negative however this may be false negative recommend patient do a home flu test in 24 hours, will start on Tamiflu  preventatively, if her test turns positive should switch from daily dosing to twice daily dosing.   Final Clinical Impressions(s) / UC Diagnoses   Final diagnoses:  None   Discharge Instructions   None    ED Prescriptions   None    PDMP not reviewed this encounter.    [1]  Social History Tobacco Use   Smoking status: Former    Current packs/day: 0.00    Types: Cigarettes    Quit date: 02/26/2015    Years since quitting: 9.0   Smokeless tobacco: Never  Vaping Use   Vaping status: Never Used  Substance Use Topics   Alcohol use: Yes    Alcohol/week: 2.0 standard drinks of alcohol    Types: 2 Standard drinks or equivalent per week  Comment: 1 per week   Drug use: Never     Ladana Chavero, [place] 03/14/24 1159  "

## 2024-03-14 NOTE — ED Triage Notes (Signed)
 Pts son tested positive for Flu B on Friday. Pt c/o cough, had a fever of 102, chest congestion, HA, nausea, and body aches. Pt has taken Ibuprofen  and her albuterol  inhalor.

## 2024-03-14 NOTE — Discharge Instructions (Addendum)
 Your flu test was negative.  If your symptoms worsen recommend over-the-counter flu test in 24 hours.  If positive start taking the Tamiflu  1 tablet twice daily Use your albuterol  inhaler as needed as previously prescribed Recheck for worsening symptoms or concern

## 2024-03-15 ENCOUNTER — Ambulatory Visit: Admitting: Family

## 2024-03-16 ENCOUNTER — Other Ambulatory Visit: Payer: Self-pay | Admitting: Family

## 2024-03-16 ENCOUNTER — Encounter: Admitting: Internal Medicine

## 2024-03-16 DIAGNOSIS — R110 Nausea: Secondary | ICD-10-CM

## 2024-03-22 ENCOUNTER — Encounter: Payer: Self-pay | Admitting: Family

## 2024-03-22 ENCOUNTER — Ambulatory Visit: Admitting: Family

## 2024-03-22 DIAGNOSIS — K219 Gastro-esophageal reflux disease without esophagitis: Secondary | ICD-10-CM

## 2024-03-22 DIAGNOSIS — R0681 Apnea, not elsewhere classified: Secondary | ICD-10-CM | POA: Diagnosis not present

## 2024-03-22 DIAGNOSIS — F411 Generalized anxiety disorder: Secondary | ICD-10-CM

## 2024-03-22 DIAGNOSIS — F902 Attention-deficit hyperactivity disorder, combined type: Secondary | ICD-10-CM

## 2024-03-22 MED ORDER — CLONAZEPAM 0.5 MG PO TABS: 0.5000 mg | ORAL_TABLET | Freq: Every day | ORAL | 0 refills | Status: AC | PRN

## 2024-03-22 MED ORDER — OMEPRAZOLE 20 MG PO CPDR: 20.0000 mg | DELAYED_RELEASE_CAPSULE | Freq: Every day | ORAL | 5 refills | Status: AC

## 2024-03-22 MED ORDER — AMPHETAMINE-DEXTROAMPHETAMINE 5 MG PO TABS: 5.0000 mg | ORAL_TABLET | Freq: Two times a day (BID) | ORAL | 0 refills | Status: AC

## 2024-03-22 MED ORDER — BUPROPION HCL ER (XL) 150 MG PO TB24: 150.0000 mg | ORAL_TABLET | Freq: Every day | ORAL | 2 refills | Status: AC

## 2024-03-22 NOTE — Progress Notes (Signed)
 "   MyChart Video Visit    Virtual Visit via Video Note   This format is felt to be most appropriate for this patient at this time. Physical exam was limited by quality of the video and audio technology used for the visit. CMA was able to get the patient set up on a video visit.  Patient location: Home. Patient and provider in visit Provider location: Office  I discussed the limitations of evaluation and management by telemedicine and the availability of in person appointments. The patient expressed understanding and agreed to proceed.  Visit Date: 03/22/2024  Today's healthcare provider: Lucius Krabbe, NP     Subjective:   Patient ID: Meagan Hunter, female    DOB: 1990/09/23, 34 y.o.   MRN: [id number]  Chief Complaint  Patient presents with   ADHD (attention deficit hyperactivity disorder), combined t   Generalized anxiety disorder  Discussed the use of AI scribe software for clinical note transcription with the patient, who gave verbal consent to proceed.  History of Present Illness Meagan Hunter is a 34 year old female with ADHD and depression who presents with concerns about medication efficacy and mood changes.  She reports inconsistent benefit from extended-release Adderall and finds the immediate-release formulation more predictable, with 4 to 5 hours of effect. She currently takes 5mg  extended-release in the morning and 5mg  immediate-release in the afternoon and is worried her gastroparesis may reduce reliability of extended-release medications. She has worsening depressive symptoms with low motivation and loss of interest and suspects postpartum depression. Bupropion  helped in the past. She previously used bupropion  XL 150 mg daily. During PMDD episodes she has marked irritability and rage that she feels Adderall worsens, and she uses Klonopin  more often during these times. She has gastroparesis and reflux and is on omeprazole . She recently refilled Zofran  for  nausea.  Assessment & Plan Attention-deficit hyperactivity disorder, combined type Prefers immediate-release Adderall for consistent effects. - Prescribed 5 mg of immediate-release Adderall twice a day. - F/U in 3 mos  Major depressive disorder Increased depressive symptoms, possibly postpartum-related. Discussed bupropion  benefits and possible increased anxiety risk. - Prescribed bupropion  150 mg XL once daily. - Advised consulting pharmacist about cutting extended-release bupropion  if needed. - F/U in 3 mos in office  Premenstrual dysphoric disorder Exacerbation of symptoms during PMDD managed with Klonopin . - Refilled Klonopin  0.5mg  qd prn for symptoms - F/U prn  Gastroesophageal reflux disease Prefers prescription omeprazole  for cost-effectiveness. - Prescribed omeprazole  20mg  qd through prescription. - F/U in 6 mos  Past Medical History:  Diagnosis Date   [redacted] weeks gestation of pregnancy 08/22/2023   Anxiety    Asthma    Bipolar 1 disorder, depressed (HCC) 02/01/2016   Chronic pain syndrome 06/20/2015   Formatting of this note might be different from the original. RUE/RLE weakness, numbness tingling Generalized abdominal pain     Depression    Gastroparesis    GERD (gastroesophageal reflux disease)    Hiatal hernia    Irritable bowel syndrome 09/28/2015   Kidney stones    Lupus    possible incorrect diagnosis, negative lupus anticoagulant, negative APL   Miscarriage 04/30/2022   Peptic ulcer    PMDD (premenstrual dysphoric disorder) 02/28/2022   Supervision of high risk pregnancy, antepartum 01/09/2023              NURSING     PROVIDER      Office Location    [facility]    Dating by    LMP c/w U/S  at 6+6 wks      [facility] Model    Traditional    Anatomy U/S    04/07/2023      Initiated care at     [facility]                           Language     English                     LAB RESULTS       Support Person    Will Conrad    Genetics    NIPS: LR/ female  AFP: neg                NT/IT  (FT only)                      Syncope and collapse    Tachycardia 05/28/2022   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 30 capsule 0   amphetamine -dextroamphetamine  (ADDERALL) 5 MG tablet Take 1 tablet (5 mg total) by mouth daily after lunch. 30 tablet 0   clonazePAM  (KLONOPIN ) 0.5 MG tablet Take 1 tablet (0.5 mg total) by mouth daily as needed for anxiety. 30 tablet 0   gabapentin  (NEURONTIN ) 100 MG capsule Take 1 capsule (100 mg total) by mouth 3 (three) times daily. 90 capsule 3   ibuprofen  (ADVIL ) 600 MG tablet Take 1 tablet (600 mg total) by mouth every 6 (six) hours. 30 tablet 0   omeprazole  (PRILOSEC) 20 MG capsule Take 20 mg by mouth daily.     ondansetron  (ZOFRAN -ODT) 4 MG disintegrating tablet DISSOLVE 1 TABLET(4 MG) ON THE TONGUE EVERY 6 HOURS AS NEEDED 20 tablet 1   oseltamivir  (TAMIFLU ) 75 MG capsule Take 1 capsule (75 mg total) by mouth daily for 10 days. 10 capsule 0   Prenatal Vit-Fe Fumarate-FA (MULTIVITAMIN-PRENATAL) 27-0.8 MG TABS tablet Take 1 tablet by mouth daily at 12 noon.     VENTOLIN  HFA 108 (90 Base) MCG/ACT inhaler Inhale 2 puffs into the lungs every 6 (six) hours as needed. 6.7 g 3   No facility-administered medications prior to visit.   Allergies[1]     Objective:   Physical Exam Vitals and nursing note reviewed.  Constitutional:      General: Pt is not in acute distress.    Appearance: Normal appearance.  HENT:     Head:  Normocephalic.  Pulmonary:     Effort: No respiratory distress.  Musculoskeletal:     Cervical back: Normal range of motion.  Skin:    General: Skin is dry.     Coloration: Skin is not pale.  Neurological:     Mental Status: Pt is alert and oriented to person, place, and time.  Psychiatric:        Mood and Affect: Mood normal.   LMP 02/10/2024   Wt Readings from Last 3 Encounters:  02/10/24 166 lb  6 oz (75.5 kg)  01/09/24 170 lb (77.1 kg)  12/19/23 170 lb (77.1 kg)      I discussed the assessment and treatment plan with the patient. The patient was provided an opportunity to ask questions and all were answered. The patient agreed with the plan and demonstrated an understanding of the instructions.   The patient was advised to call back or seek an in-person evaluation if the symptoms worsen or if the condition fails to improve as anticipated.  Lucius Krabbe, NP [facility] [phone] (phone) [phone] (fax)  [facility]     [1]  Allergies Allergen Reactions   Penicillins Hives, Itching, Nausea And Vomiting and Other (See Comments)    Other reaction(s): Fever Fever, rash and vomiting Fever, rash and vomiting Other reaction(s): Fever Fever, rash and vomiting   Zoloft [Sertraline] Other (See Comments)    seizures Other reaction(s): Seizures seizures   "
